# Patient Record
Sex: Male | Born: 1985 | Race: Black or African American | Hispanic: No | Marital: Single
Health system: Southern US, Community
[De-identification: ages and names within clinical notes are randomized; demographics above are authoritative.]

## PROBLEM LIST (undated history)

## (undated) ENCOUNTER — Emergency Department (HOSPITAL_COMMUNITY): Admission: EM | Payer: Medicaid Other

## (undated) DIAGNOSIS — F17200 Nicotine dependence, unspecified, uncomplicated: Secondary | ICD-10-CM

## (undated) DIAGNOSIS — I10 Essential (primary) hypertension: Secondary | ICD-10-CM

## (undated) DIAGNOSIS — S82872C Displaced pilon fracture of left tibia, initial encounter for open fracture type IIIA, IIIB, or IIIC: Secondary | ICD-10-CM

## (undated) DIAGNOSIS — S82872R Displaced pilon fracture of left tibia, subsequent encounter for open fracture type IIIA, IIIB, or IIIC with malunion: Secondary | ICD-10-CM

## (undated) DIAGNOSIS — S82832C Other fracture of upper and lower end of left fibula, initial encounter for open fracture type IIIA, IIIB, or IIIC: Secondary | ICD-10-CM

## (undated) DIAGNOSIS — R569 Unspecified convulsions: Secondary | ICD-10-CM

## (undated) DIAGNOSIS — S9302XA Subluxation of left ankle joint, initial encounter: Secondary | ICD-10-CM

## (undated) DIAGNOSIS — Z789 Other specified health status: Secondary | ICD-10-CM

## (undated) HISTORY — PX: LEG SURGERY: SHX1003

## (undated) HISTORY — PX: NO PAST SURGERIES: SHX2092

## (undated) HISTORY — PX: MANDIBLE FRACTURE SURGERY: SHX706

---
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
## 2006-05-02 ENCOUNTER — Emergency Department (HOSPITAL_COMMUNITY): Admission: EM | Admit: 2006-05-02 | Discharge: 2006-05-02 | Payer: Self-pay | Admitting: Emergency Medicine

## 2007-01-22 ENCOUNTER — Emergency Department (HOSPITAL_COMMUNITY): Admission: EM | Admit: 2007-01-22 | Discharge: 2007-01-23 | Payer: Self-pay | Admitting: Emergency Medicine

## 2007-03-16 ENCOUNTER — Emergency Department (HOSPITAL_COMMUNITY): Admission: EM | Admit: 2007-03-16 | Discharge: 2007-03-16 | Payer: Self-pay | Admitting: Emergency Medicine

## 2007-03-23 ENCOUNTER — Emergency Department (HOSPITAL_COMMUNITY): Admission: EM | Admit: 2007-03-23 | Discharge: 2007-03-23 | Payer: Self-pay | Admitting: Emergency Medicine

## 2007-06-24 ENCOUNTER — Emergency Department (HOSPITAL_COMMUNITY): Admission: EM | Admit: 2007-06-24 | Discharge: 2007-06-24 | Payer: Self-pay | Admitting: Emergency Medicine

## 2007-12-04 ENCOUNTER — Emergency Department (HOSPITAL_COMMUNITY): Admission: EM | Admit: 2007-12-04 | Discharge: 2007-12-04 | Payer: Self-pay | Admitting: Emergency Medicine

## 2007-12-11 ENCOUNTER — Emergency Department (HOSPITAL_COMMUNITY): Admission: EM | Admit: 2007-12-11 | Discharge: 2007-12-11 | Payer: Self-pay | Admitting: Emergency Medicine

## 2010-04-13 ENCOUNTER — Emergency Department (HOSPITAL_COMMUNITY)
Admission: EM | Admit: 2010-04-13 | Discharge: 2010-04-13 | Disposition: A | Discharge status: Home / Self Care | Payer: Self-pay | Attending: Emergency Medicine | Admitting: Emergency Medicine

## 2010-04-13 DIAGNOSIS — S21109A Unspecified open wound of unspecified front wall of thorax without penetration into thoracic cavity, initial encounter: Secondary | ICD-10-CM | POA: Insufficient documentation

## 2010-04-13 DIAGNOSIS — W208XXA Other cause of strike by thrown, projected or falling object, initial encounter: Secondary | ICD-10-CM | POA: Insufficient documentation

## 2010-04-13 DIAGNOSIS — M542 Cervicalgia: Secondary | ICD-10-CM | POA: Insufficient documentation

## 2010-04-13 DIAGNOSIS — Y92009 Unspecified place in unspecified non-institutional (private) residence as the place of occurrence of the external cause: Secondary | ICD-10-CM | POA: Insufficient documentation

## 2010-04-13 DIAGNOSIS — R079 Chest pain, unspecified: Secondary | ICD-10-CM | POA: Insufficient documentation

## 2010-04-13 DIAGNOSIS — S1190XA Unspecified open wound of unspecified part of neck, initial encounter: Secondary | ICD-10-CM | POA: Insufficient documentation

## 2010-04-23 ENCOUNTER — Emergency Department (HOSPITAL_COMMUNITY)
Admission: EM | Admit: 2010-04-23 | Discharge: 2010-04-23 | Disposition: A | Discharge status: Home / Self Care | Payer: Self-pay | Attending: Emergency Medicine | Admitting: Emergency Medicine

## 2010-04-23 DIAGNOSIS — Z4802 Encounter for removal of sutures: Secondary | ICD-10-CM | POA: Insufficient documentation

## 2010-07-20 ENCOUNTER — Emergency Department (HOSPITAL_COMMUNITY): Payer: Self-pay

## 2010-07-20 ENCOUNTER — Emergency Department (HOSPITAL_COMMUNITY)
Admission: EM | Admit: 2010-07-20 | Discharge: 2010-07-20 | Disposition: A | Discharge status: Home / Self Care | Payer: Self-pay | Attending: Emergency Medicine | Admitting: Emergency Medicine

## 2010-07-20 DIAGNOSIS — X58XXXA Exposure to other specified factors, initial encounter: Secondary | ICD-10-CM | POA: Insufficient documentation

## 2010-07-20 DIAGNOSIS — S62329A Displaced fracture of shaft of unspecified metacarpal bone, initial encounter for closed fracture: Secondary | ICD-10-CM | POA: Insufficient documentation

## 2010-07-20 DIAGNOSIS — F1010 Alcohol abuse, uncomplicated: Secondary | ICD-10-CM | POA: Insufficient documentation

## 2010-10-16 ENCOUNTER — Emergency Department (HOSPITAL_COMMUNITY)
Admission: EM | Admit: 2010-10-16 | Discharge: 2010-10-16 | Disposition: A | Discharge status: Home / Self Care | Payer: Self-pay | Attending: Emergency Medicine | Admitting: Emergency Medicine

## 2010-10-16 DIAGNOSIS — F17200 Nicotine dependence, unspecified, uncomplicated: Secondary | ICD-10-CM | POA: Insufficient documentation

## 2010-10-16 DIAGNOSIS — Y92009 Unspecified place in unspecified non-institutional (private) residence as the place of occurrence of the external cause: Secondary | ICD-10-CM | POA: Insufficient documentation

## 2010-10-16 DIAGNOSIS — S61409A Unspecified open wound of unspecified hand, initial encounter: Secondary | ICD-10-CM | POA: Insufficient documentation

## 2010-10-16 DIAGNOSIS — W268XXA Contact with other sharp object(s), not elsewhere classified, initial encounter: Secondary | ICD-10-CM | POA: Insufficient documentation

## 2010-10-16 DIAGNOSIS — S61419A Laceration without foreign body of unspecified hand, initial encounter: Secondary | ICD-10-CM

## 2010-10-16 MED ORDER — LIDOCAINE HCL (PF) 1 % IJ SOLN
INTRAMUSCULAR | Status: AC
  Administered 2010-10-16: 5 mL
  Filled 2010-10-16: qty 5

## 2010-10-16 NOTE — ED Notes (Signed)
Pt states he was washing a glass today and glass broke with his hand inside cutting his right hand.

## 2010-10-16 NOTE — ED Provider Notes (Signed)
History     CSN: [id number] Arrival date & time: 10/16/2010  2:18 PM  Chief Complaint  Patient presents with  . Extremity Laceration   HPI  History reviewed. No pertinent past medical history.  History reviewed. No pertinent past surgical history.  No family history on file.  History  Substance Use Topics  . Smoking status: Current Everyday Smoker  . Smokeless tobacco: Not on file  . Alcohol Use: Yes      Review of Systems  Physical Exam  BP 137/68  Pulse 76  Temp(Src) 98.4 F (36.9 C) (Oral)  Resp 18  Ht 5\' 9"  (1.753 m)  Wt 191 lb 3.2 oz (86.728 kg)  BMI 28.24 kg/m2  SpO2 100%  Physical Exam  ED Course  Procedures  MDM       Geoffery Lyons, MD 10/16/10 1558

## 2010-10-16 NOTE — ED Provider Notes (Signed)
History   Chart scribed for Damon Lyons, MD by Enos Fling; the patient was seen in room APFT22/APFT22; this patient's care was started at 3:15 PM.   CSN: [id number] Arrival date & time: 10/16/2010  2:18 PM  Chief Complaint  Patient presents with  . Extremity Laceration   HPI Damon Alexander is a 25 y.o. male who presents to the Emergency Department complaining of laceration. Pt c/o laceration to the back of his right hand caused by broken glass while washing dishes just pta. Bleeding is controlled. No numbness or tingling. Pt has no other complaints, states he was in his usual state of health prior to injury. Tetanus UTD. 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   History reviewed. No pertinent past medical history.  History reviewed. No pertinent past surgical history.  No family history on file.  History  Substance Use Topics  . Smoking status: Current Everyday Smoker  . Smokeless tobacco: Not on file  . Alcohol Use: Yes   Previous Medications   No medications on file     Allergies as of 10/16/2010  . (No Known Allergies)      Review of Systems  Constitutional: Negative for fever.  HENT: Negative for congestion.   Eyes: Negative for redness.  Gastrointestinal: Negative for vomiting.  Musculoskeletal: Negative for joint swelling and arthralgias.  Skin: Positive for wound. Negative for rash.  Hematological: Does not bruise/bleed easily.    Physical Exam  BP 137/68  Pulse 76  Temp(Src) 98.4 F (36.9 C) (Oral)  Resp 18  Ht 5\' 9"  (1.753 m)  Wt 191 lb 3.2 oz (86.728 kg)  BMI 28.24 kg/m2  SpO2 100%  Physical Exam  Constitutional: He is oriented to person, place, and time. He appears well-developed and well-nourished. No distress.  HENT:  Head: Normocephalic and atraumatic.  Eyes: Conjunctivae are normal.  Neck: Normal range of motion. Neck supple.  Musculoskeletal: Normal range of motion.  Neurological: He is alert and oriented to person,  place, and time.  Skin: Skin is warm and dry. No rash noted.       2.5 cm laceration to back of hand, tendons intact, NVI  Psychiatric: He has a normal mood and affect.    Procedures - Laceration repair, see below  OTHER DATA REVIEWED: Nursing notes and vital signs reviewed.   ED COURSE: LACERATION REPAIR PROCEDURE NOTE The patient's identification was confirmed and consent was obtained. This procedure was performed by Damon Lyons, MD at 3:23 PM. Site: dorsum right hand Sterile procedures observed Anesthetic used (type and amt): 1% lidocaine Suture type/size:4-0 prolene Length: 2.5cm # of Sutures: 4 Technique: simple interrupted Antibx ointment applied Tetanus UTD  Site anesthetized, irrigated copiously with NS and betadine, explored without evidence of foreign body, wound well approximated, site covered with dry, sterile dressing.  Patient tolerated procedure well without complications. Instructions for care discussed verbally and patient provided with additional written instructions for homecare and f/u.   MDM: No tendon involvement.  Closed uneventfully.  Return in one week to have sutures removed.  IMPRESSION: 1. Laceration of hand      PLAN: Discharge All results reviewed and discussed with pt, questions answered, pt agreeable with plan.   CONDITION ON DISCHARGE: Improved  MEDS GIVEN IN ED:  Medications  lidocaine (XYLOCAINE) 1 % injection (5 mL  Given 10/16/10 1515)     SCRIBE ATTESTATION: I personally performed the services described in this documentation, which was scribed in my presence. The recorded information  has been reviewed and considered. No att. providers found      Damon Lyons, MD 10/16/10 1557

## 2010-10-16 NOTE — ED Notes (Signed)
Pt washing dishes and put hand in glass cup. Cup broke and cut rt hand. Laceration noted to back of rt hand. Bleeding controlled at this time.

## 2011-05-30 ENCOUNTER — Emergency Department (HOSPITAL_COMMUNITY)
Admission: EM | Admit: 2011-05-30 | Discharge: 2011-05-30 | Discharge status: Against Medical Advice | Payer: Self-pay | Attending: Emergency Medicine | Admitting: Emergency Medicine

## 2011-05-30 ENCOUNTER — Encounter (HOSPITAL_COMMUNITY): Payer: Self-pay

## 2011-05-30 DIAGNOSIS — R112 Nausea with vomiting, unspecified: Secondary | ICD-10-CM | POA: Insufficient documentation

## 2011-05-30 DIAGNOSIS — R109 Unspecified abdominal pain: Secondary | ICD-10-CM | POA: Insufficient documentation

## 2011-05-30 NOTE — ED Notes (Signed)
Pt c/o left groin/lower abd pain associated mild n/v, pt states that the pain started yesterday, admits to problems with urination.

## 2011-05-30 NOTE — ED Notes (Signed)
Unable to locate pt in all waiting areas 

## 2011-05-30 NOTE — ED Notes (Signed)
Called pt in all waiting areas without response

## 2011-05-31 ENCOUNTER — Emergency Department (HOSPITAL_COMMUNITY)
Admission: EM | Admit: 2011-05-31 | Discharge: 2011-05-31 | Disposition: A | Discharge status: Home / Self Care | Payer: Self-pay | Attending: Emergency Medicine | Admitting: Emergency Medicine

## 2011-05-31 ENCOUNTER — Encounter (HOSPITAL_COMMUNITY): Payer: Self-pay | Admitting: *Deleted

## 2011-05-31 ENCOUNTER — Emergency Department (HOSPITAL_COMMUNITY): Payer: Self-pay

## 2011-05-31 DIAGNOSIS — R1032 Left lower quadrant pain: Secondary | ICD-10-CM | POA: Insufficient documentation

## 2011-05-31 DIAGNOSIS — N451 Epididymitis: Secondary | ICD-10-CM

## 2011-05-31 DIAGNOSIS — N453 Epididymo-orchitis: Secondary | ICD-10-CM | POA: Insufficient documentation

## 2011-05-31 LAB — URINE MICROSCOPIC-ADD ON

## 2011-05-31 LAB — URINALYSIS, ROUTINE W REFLEX MICROSCOPIC
Ketones, ur: NEGATIVE mg/dL
Nitrite: NEGATIVE
Protein, ur: 30 mg/dL — AB
pH: 6.5 (ref 5.0–8.0)

## 2011-05-31 MED ORDER — HYDROCODONE-ACETAMINOPHEN 5-325 MG PO TABS
1.0000 | ORAL_TABLET | Freq: Once | ORAL | Status: AC
  Administered 2011-05-31: 1 via ORAL
  Filled 2011-05-31: qty 1

## 2011-05-31 MED ORDER — DOXYCYCLINE HYCLATE 100 MG PO CAPS: 100.0000 mg | ORAL_CAPSULE | Freq: Two times a day (BID) | ORAL | Status: AC

## 2011-05-31 MED ORDER — LIDOCAINE HCL (PF) 1 % IJ SOLN
INTRAMUSCULAR | Status: AC
  Administered 2011-05-31: 5 mL
  Filled 2011-05-31: qty 5

## 2011-05-31 MED ORDER — CEFTRIAXONE SODIUM 250 MG IJ SOLR
250.0000 mg | Freq: Once | INTRAMUSCULAR | Status: AC
  Administered 2011-05-31: 250 mg via INTRAMUSCULAR
  Filled 2011-05-31: qty 250

## 2011-05-31 MED ORDER — HYDROCODONE-ACETAMINOPHEN 5-325 MG PO TABS: 1.0000 | ORAL_TABLET | Freq: Four times a day (QID) | ORAL | Status: AC | PRN

## 2011-05-31 MED ORDER — NAPROXEN 500 MG PO TABS: 500.0000 mg | ORAL_TABLET | Freq: Two times a day (BID) | ORAL | Status: DC

## 2011-05-31 NOTE — ED Notes (Signed)
C/o pain to left lower side x 3 days.  Reports left testicle started swelling last night.  Denies GU sx, although pt reports had a drop of blood with urination.  Denies n/v/d.

## 2011-05-31 NOTE — ED Provider Notes (Addendum)
History     CSN: [id number]  Arrival date & time 05/31/11  4098   First MD Initiated Contact with Patient 05/31/11 [phone]      Chief Complaint  Patient presents with  . left side pain   . Groin Swelling    (Consider location/radiation/quality/duration/timing/severity/associated sxs/prior treatment) The history is provided by the patient.   patient is a 26 year old male with complaint of left lower quadrant abdominal pain for 3 days. Onset of left testicular swelling and pain last night. Has noted a few drops of blood in his urine. No history of similar problem. No fever no chills patient states the pain is 10 out of 10 goes from the left testicle to the left lower quadrant area denies any groin mass. Denies any penile discharge.  History reviewed. No pertinent past medical history.  History reviewed. No pertinent past surgical history.  No family history on file.  History  Substance Use Topics  . Smoking status: Current Everyday Smoker    Types: Cigarettes  . Smokeless tobacco: Not on file  . Alcohol Use: Yes     every other day      Review of Systems  Constitutional: Negative for fever and chills.  HENT: Negative for neck pain.   Eyes: Negative for redness.  Respiratory: Negative for shortness of breath.   Cardiovascular: Negative for chest pain.  Gastrointestinal: Positive for abdominal pain. Negative for nausea and vomiting.  Genitourinary: Positive for hematuria, scrotal swelling and testicular pain.  Musculoskeletal: Negative for back pain.  Skin: Negative for rash.  Neurological: Negative for headaches.  Hematological: Does not bruise/bleed easily.    Allergies  Review of patient's allergies indicates no known allergies.  Home Medications   Current Outpatient Rx  Name Route Sig Dispense Refill  . DOXYCYCLINE HYCLATE 100 MG PO CAPS Oral Take 1 capsule (100 mg total) by mouth 2 (two) times daily. 20 capsule 0  . HYDROCODONE-ACETAMINOPHEN 5-325 MG PO TABS  Oral Take 1-2 tablets by mouth every 6 (six) hours as needed for pain. 10 tablet 0  . NAPROXEN 500 MG PO TABS Oral Take 1 tablet (500 mg total) by mouth 2 (two) times daily. 14 tablet 0    BP 137/79  Pulse 74  Temp(Src) 98.8 F (37.1 C) (Oral)  Resp 20  Ht 5\' 9"  (1.753 m)  Wt 188 lb (85.276 kg)  BMI 27.76 kg/m2  SpO2 97%  Physical Exam  Nursing note and vitals reviewed. Constitutional: He is oriented to person, place, and time. He appears well-developed and well-nourished. No distress.  HENT:  Head: Normocephalic and atraumatic.  Mouth/Throat: Oropharynx is clear and moist.  Eyes: Conjunctivae and EOM are normal. Pupils are equal, round, and reactive to light.  Neck: Normal range of motion. Neck supple.  Cardiovascular: Normal rate, regular rhythm and normal heart sounds.   No murmur heard. Pulmonary/Chest: Effort normal and breath sounds normal. No respiratory distress. He has no wheezes. He has no rales.  Abdominal: Soft. Bowel sounds are normal. He exhibits no mass.       Minimal left lower quadrant tenderness no guarding.  Genitourinary: Penis normal. No penile tenderness.       No penile discharge. Marked swelling of the left testicle with tenderness not able to appreciate a boggy epididymis. Right testicle normal. No inguinal hernias.  Musculoskeletal: Normal range of motion. He exhibits no edema.  Neurological: He is alert and oriented to person, place, and time. No cranial nerve deficit. He exhibits normal muscle tone.  Coordination normal.  Skin: Skin is warm. No rash noted.    ED Course  Procedures (including critical care time)  Labs Reviewed  URINALYSIS, ROUTINE W REFLEX MICROSCOPIC - Abnormal; Notable for the following:    Color, Urine AMBER (*) BIOCHEMICALS MAY BE AFFECTED BY COLOR   APPearance CLOUDY (*)    Hgb urine dipstick MODERATE (*)    Bilirubin Urine SMALL (*)    Protein, ur 30 (*)    Urobilinogen, UA 4.0 (*)    Leukocytes, UA SMALL (*)    All other  components within normal limits  URINE MICROSCOPIC-ADD ON - Abnormal; Notable for the following:    Bacteria, UA MANY (*)    All other components within normal limits   US Scrotum  05/31/2011  *RADIOLOGY REPORT*  Clinical Data:  Left scrotal pain and swelling.  SCROTAL ULTRASOUND DOPPLER ULTRASOUND OF THE TESTICLES  Technique: Complete ultrasound examination of the testicles, epididymis, and other scrotal structures was performed.  Color and spectral Doppler ultrasound were also utilized to evaluate blood flow to the testicles.  Comparison:  None.  Findings:  Right testis:  Mildly heterogeneous.  Scattered tiny calcifications.  Left testis:  Mildly heterogeneous.  Scattered tiny calcifications.  Right epididymis:  Normal in size and appearance.  Left epididymis:  Enlarged and heterogeneous with a increased internal blood flow with color Doppler.  Hydrocele:  Small hydrocele on the left.  Varicocele:  None.  Pulsed Doppler interrogation of both testes demonstrates low resistance flow bilaterally.  IMPRESSION:  1.  Left epididymitis. 2.  Bilateral testicular microlithiasis. 3.  Small left hydrocele.  Original Report Authenticated By: Darrol Angel, M.D.   [place] Art/ven Flow Abd Pelv Doppler  05/31/2011  *RADIOLOGY REPORT*  Clinical Data:  Left scrotal pain and swelling.  SCROTAL ULTRASOUND DOPPLER ULTRASOUND OF THE TESTICLES  Technique: Complete ultrasound examination of the testicles, epididymis, and other scrotal structures was performed.  Color and spectral Doppler ultrasound were also utilized to evaluate blood flow to the testicles.  Comparison:  None.  Findings:  Right testis:  Mildly heterogeneous.  Scattered tiny calcifications.  Left testis:  Mildly heterogeneous.  Scattered tiny calcifications.  Right epididymis:  Normal in size and appearance.  Left epididymis:  Enlarged and heterogeneous with a increased internal blood flow with color Doppler.  Hydrocele:  Small hydrocele on the left.  Varicocele:   None.  Pulsed Doppler interrogation of both testes demonstrates low resistance flow bilaterally.  IMPRESSION:  1.  Left epididymitis. 2.  Bilateral testicular microlithiasis. 3.  Small left hydrocele.  Original Report Authenticated By: Darrol Angel, M.D.   Results for orders placed during the hospital encounter of 05/31/11  URINALYSIS, ROUTINE W REFLEX MICROSCOPIC      Component Value Range   Color, Urine AMBER (*) YELLOW    APPearance CLOUDY (*) CLEAR    Specific Gravity, Urine 1.020  1.005 - 1.030    pH 6.5  5.0 - 8.0    Glucose, UA NEGATIVE  NEGATIVE (mg/dL)   Hgb urine dipstick MODERATE (*) NEGATIVE    Bilirubin Urine SMALL (*) NEGATIVE    Ketones, ur NEGATIVE  NEGATIVE (mg/dL)   Protein, ur 30 (*) NEGATIVE (mg/dL)   Urobilinogen, UA 4.0 (*) 0.0 - 1.0 (mg/dL)   Nitrite NEGATIVE  NEGATIVE    Leukocytes, UA SMALL (*) NEGATIVE   URINE MICROSCOPIC-ADD ON      Component Value Range   WBC, UA 11-20  <3 (WBC/hpf)   RBC / HPF 3-6  <  3 (RBC/hpf)   Bacteria, UA MANY (*) RARE    Urine-Other MUCOUS PRESENT       1. Epididymitis       MDM   Clinically suspect the epididymitis with orchitis but will get ultrasound Doppler rule out testicular torsion since the testicular pain just started last night. Patient has had some left lower quadrant discomfort since Thursday would be 3 days ago. Noted some blood, urinalysis consistent with the epididymitis as well.   Ultrasound consistent with left-sided epididymitis. Based on age treat with IM Rocephin here and ten-day course of doxycycline.     Shelda Jakes, MD 05/31/11 1610  Shelda Jakes, MD 05/31/11 1024

## 2011-05-31 NOTE — Discharge Instructions (Signed)
Epididymitis Epididymitis is a swelling (inflammation) of the epididymis. The epididymis is a cord-like structure along the back part of the testicle. Epididymitis is usually, but not always, caused by infection. This is usually a sudden problem beginning with chills, fever and pain behind the scrotum and in the testicle. There may be swelling and redness of the testicle. DIAGNOSIS  Physical examination will reveal a tender, swollen epididymis. Sometimes, cultures are obtained from the urine or from prostate secretions to help find out if there is an infection or if the cause is a different problem. Sometimes, blood work is performed to see if your white blood cell count is elevated and if a germ (bacterial) or viral infection is present. Using this knowledge, an appropriate medicine which kills germs (antibiotic) can be chosen by your caregiver. A viral infection causing epididymitis will most often go away (resolve) without treatment. HOME CARE INSTRUCTIONS   Hot sitz baths for 20 minutes, 4 times per day, may help relieve pain.   Only take over-the-counter or prescription medicines for pain, discomfort or fever as directed by your caregiver.   Take all medicines, including antibiotics, as directed. Take the antibiotics for the full prescribed length of time even if you are feeling better.   It is very important to keep all follow-up appointments.  SEEK IMMEDIATE MEDICAL CARE IF:   You have a fever.   You have pain not relieved with medicines.   You have any worsening of your problems.   Your pain seems to come and go.   You develop pain, redness, and swelling in the scrotum and surrounding areas.  MAKE SURE YOU:   Understand these instructions.   Will watch your condition.   Will get help right away if you are not doing well or get worse.  Document Released: 01/18/2000 Document Revised: 01/09/2011 Document Reviewed: 12/07/2008 [facility] Patient Information 2012 [place],  [place].  RESOURCE GUIDE  Dental Problems  Patients with Medicaid: [facility] [phone] [address]                                           [phone] [address] Phone:  [phone]                                                  Phone:  [phone]  If unable to pay or uninsured, contact:  [facility] or [facility]. to become qualified for the adult dental clinic.  Chronic Pain Problems Contact [facility]  [phone] Patients need to be referred by their primary care doctor.  Insufficient Money for Medicine Contact [employer]:  call "211" or [facility] 469-728-9158.  No Primary Care Doctor Call [facility]  [phone] Other agencies that provide inexpensive medical care    [facility]  [phone]    [facility]  [phone]    [facility]  [phone]    [facility]  [phone]    [facility]  [phone]    [facility]  [phone]  Psychological Services [facility]  [phone]  [employer] [facility]  [phone] [facility]   [phone] (emergency services [phone])  Substance Abuse Resources [facility]  2766552884 [facility] [phone] [facility] [place] [phone] Floydene Flock [phone] Residential & Outpatient Substance Abuse Program  919-802-7812  Abuse/Neglect [facility] Child Abuse Hotline (629) 514-4822 [facility] Child Abuse Hotline 563-153-0855 (After Hours)  Emergency Shelter [facility] 417-033-4719  M[facility] [facility] 712-078-2574 [facility] [phone]  MRSA Hotline #:   212-146-6240    [facility] Resources  [facility]     [employer]                          [facility] [address] [place]                       [address]      [address]  Blondell Reveal Phone:  [phone]                                   Phone:  [phone]                 Phone:  [phone]  [facility] Mental Health Phone:  [phone]  [facility] Child Abuse Hotline (779) 454-0736 [phone] (After Hours)  Assistant with epididymitis. Take in a body cast directed until finished. Scrotal support is helpful. Take Naprosyn or Motrin on a regular basis. Supplement with hydrocodone pain medicine as needed. Return for new or worse symptoms. Return if not improving in 2-4 days. Followup with primary care Dr. resource guide provided help you find one.

## 2011-08-18 ENCOUNTER — Emergency Department (HOSPITAL_COMMUNITY): Payer: Self-pay

## 2011-08-18 ENCOUNTER — Emergency Department (HOSPITAL_COMMUNITY)
Admission: EM | Admit: 2011-08-18 | Discharge: 2011-08-18 | Disposition: A | Discharge status: Home / Self Care | Payer: Self-pay | Attending: Emergency Medicine | Admitting: Emergency Medicine

## 2011-08-18 ENCOUNTER — Encounter (HOSPITAL_COMMUNITY): Payer: Self-pay | Admitting: Emergency Medicine

## 2011-08-18 DIAGNOSIS — N509 Disorder of male genital organs, unspecified: Secondary | ICD-10-CM | POA: Insufficient documentation

## 2011-08-18 DIAGNOSIS — N451 Epididymitis: Secondary | ICD-10-CM

## 2011-08-18 DIAGNOSIS — N5089 Other specified disorders of the male genital organs: Secondary | ICD-10-CM | POA: Insufficient documentation

## 2011-08-18 DIAGNOSIS — N453 Epididymo-orchitis: Secondary | ICD-10-CM | POA: Insufficient documentation

## 2011-08-18 DIAGNOSIS — R109 Unspecified abdominal pain: Secondary | ICD-10-CM | POA: Insufficient documentation

## 2011-08-18 DIAGNOSIS — R1909 Other intra-abdominal and pelvic swelling, mass and lump: Secondary | ICD-10-CM | POA: Insufficient documentation

## 2011-08-18 DIAGNOSIS — F17200 Nicotine dependence, unspecified, uncomplicated: Secondary | ICD-10-CM | POA: Insufficient documentation

## 2011-08-18 DIAGNOSIS — R319 Hematuria, unspecified: Secondary | ICD-10-CM | POA: Insufficient documentation

## 2011-08-18 LAB — URINALYSIS, ROUTINE W REFLEX MICROSCOPIC
Bilirubin Urine: NEGATIVE
Specific Gravity, Urine: 1.025 (ref 1.005–1.030)
pH: 6 (ref 5.0–8.0)

## 2011-08-18 LAB — URINE MICROSCOPIC-ADD ON

## 2011-08-18 MED ORDER — CEFTRIAXONE SODIUM 250 MG IJ SOLR
250.0000 mg | Freq: Once | INTRAMUSCULAR | Status: AC
  Administered 2011-08-18: 250 mg via INTRAMUSCULAR
  Filled 2011-08-18: qty 250

## 2011-08-18 MED ORDER — LIDOCAINE HCL (PF) 1 % IJ SOLN
INTRAMUSCULAR | Status: AC
  Administered 2011-08-18: 2.1 mL
  Filled 2011-08-18: qty 5

## 2011-08-18 MED ORDER — DOXYCYCLINE HYCLATE 100 MG PO CAPS: 100.0000 mg | ORAL_CAPSULE | Freq: Two times a day (BID) | ORAL | Status: AC

## 2011-08-18 MED ORDER — METRONIDAZOLE 500 MG PO TABS: 500.0000 mg | ORAL_TABLET | Freq: Two times a day (BID) | ORAL | Status: AC

## 2011-08-18 MED ORDER — DOXYCYCLINE HYCLATE 100 MG PO TABS
100.0000 mg | ORAL_TABLET | Freq: Once | ORAL | Status: AC
  Administered 2011-08-18: 100 mg via ORAL
  Filled 2011-08-18: qty 1

## 2011-08-18 NOTE — ED Notes (Signed)
Pt to xray

## 2011-08-18 NOTE — ED Notes (Signed)
Pt c/o pain and swelling to his right testicle. Pt alert and oriented x 3. Skin warm and dry. Color pink. Breath sounds clear and equal bilaterally. Pt notified that he was to have nothing to eat or drink per orders.

## 2011-08-18 NOTE — ED Notes (Signed)
Pt c/o rt testicle swelling and abd pain since Saturday.

## 2011-08-18 NOTE — ED Provider Notes (Signed)
History  This chart was scribed for Glynn Octave, MD by Ladona Ridgel Day. This patient was seen in room APA04/APA04 and the patient's care was started at 0708.  CSN: [id number]  Arrival date & time 08/18/11  0708   First MD Initiated Contact with Patient 08/18/11 0719      Chief Complaint  Patient presents with  . Groin Swelling  . Abdominal Pain    Patient is a 26 y.o. male presenting with abdominal pain. The history is provided by the patient. No language interpreter was used.  Abdominal Pain The primary symptoms of the illness include abdominal pain (Right sided. ). The primary symptoms of the illness do not include fever, shortness of breath, vomiting or diarrhea.  Additional symptoms associated with the illness include hematuria (Mild. ). Symptoms associated with the illness do not include chills or back pain.   Damon Alexander is a 26 y.o. male who presents to the Emergency Department complaining of gradually worsening right testicular swelling/pain which began two days ago and states a similar previous episode on his left side. He states previously diagnosed with an STI and treated with antibiotics and pain medicine. He also states with increased testicular pain he is having right sided abdominal pain for two days and states bloody discharge from penis, mild hematuria, sexually active with condom use. He denies any emesis, abnormal BMs, and no prev abdominal surgeries.    History reviewed. No pertinent past medical history.  History reviewed. No pertinent past surgical history.  History reviewed. No pertinent family history.  History  Substance Use Topics  . Smoking status: Current Everyday Smoker    Types: Cigarettes  . Smokeless tobacco: Not on file  . Alcohol Use: Yes     every other day      Review of Systems  Constitutional: Negative for fever and chills.  HENT: Negative for congestion and rhinorrhea.   Eyes: Negative for redness.  Respiratory: Negative for cough,  chest tightness and shortness of breath.   Cardiovascular: Negative for chest pain.  Gastrointestinal: Positive for abdominal pain (Right sided. ). Negative for vomiting and diarrhea.  Genitourinary: Positive for hematuria (Mild. ) and testicular pain (Right testicular pain/swelling. ). Negative for flank pain.  Musculoskeletal: Negative for back pain.  Skin: Negative for color change and pallor.  Neurological: Negative for headaches.  All other systems reviewed and are negative.    Allergies  Review of patient's allergies indicates no known allergies.  Home Medications   Current Outpatient Rx  Name Route Sig Dispense Refill  . NAPROXEN 500 MG PO TABS Oral Take 1 tablet (500 mg total) by mouth 2 (two) times daily. 14 tablet 0    Triage Vitals: BP 151/77  Pulse 94  Temp 98.7 F (37.1 C) (Oral)  Resp 18  Ht 5' 9.5" (1.765 m)  Wt 188 lb (85.276 kg)  BMI 27.36 kg/m2  SpO2 97%  Physical Exam  Nursing note and vitals reviewed. Constitutional: He is oriented to person, place, and time. He appears well-developed and well-nourished. No distress.  HENT:  Head: Normocephalic and atraumatic.  Eyes: EOM are normal.  Neck: Neck supple. No tracheal deviation present.  Cardiovascular: Normal rate.   Pulmonary/Chest: Effort normal. No respiratory distress.  Abdominal: Soft. He exhibits no distension. There is no rebound and no guarding.       Non tender at McBurney's point.   Genitourinary: Penis normal.       Asymetrically swollen right testiclec diffusely tender. No discharge. Right inguinal  pain.    Musculoskeletal: Normal range of motion.  Neurological: He is alert and oriented to person, place, and time.  Skin: Skin is warm and dry.  Psychiatric: He has a normal mood and affect. His behavior is normal.    ED Course  Procedures (including critical care time) DIAGNOSTIC STUDIES: Oxygen Saturation is 97% on room air, adequate by my interpretation.    COORDINATION OF CARE: At  727 AM Discussed treatment plan with patient which includes ultrasound for right testicle, and urine analysis. Patient agrees.   Labs Reviewed  URINALYSIS, ROUTINE W REFLEX MICROSCOPIC - Abnormal; Notable for the following:    Glucose, UA 100 (*)     Hgb urine dipstick MODERATE (*)     Leukocytes, UA SMALL (*)     All other components within normal limits  URINE MICROSCOPIC-ADD ON - Abnormal; Notable for the following:    Bacteria, UA FEW (*)     All other components within normal limits  GC/CHLAMYDIA PROBE AMP, GENITAL   US Scrotum  08/18/2011  *RADIOLOGY REPORT*  Clinical Data:  Right testicular swelling question torsion  SCROTAL ULTRASOUND DOPPLER ULTRASOUND OF THE TESTICLES  Technique: Complete ultrasound examination of the testicles, epididymis, and other scrotal structures was performed.  Color and spectral Doppler ultrasound were also utilized to evaluate blood flow to the testicles.  Comparison:  05/31/2011  Findings:  Right testis:  5.0 x 3.0 x 3.1 cm. Scattered hypoechoic striations within the right testis similar to previous exam.  Few tiny nonspecific intratesticular calcifications.  No intratesticular mass or cyst. Hyperemia within right testis on color Doppler imaging.  Left testis:  4.1 x 2.6 x 3.1 cm. Normal parenchymal echogenicity. Few tiny scattered intratesticular microcalcifications, nonspecific.  No intratesticular mass or cyst.  Internal blood flow present within left testis on color Doppler imaging.  Right epididymis:  Enlarged, heterogeneous, with significantly increased blood flow on color Doppler imaging.  No focal mass.  Left epididymis:  Normal in size and appearance.  Hydrocele:  Trace bilateral peri testicular fluid.  Varicocele:  Absent bilaterally  Pulsed Doppler interrogation of both testes demonstrates intratesticular venous and low resistance arterial blood flow.  No hernia.  IMPRESSION: No evidence of testicular mass or torsion. Significant hyperemia of right  epididymis and to a lesser extent right testis as compared to left compatible with epididymo- orchitis. Few tiny bilateral nonspecific microcalcifications.  Original Report Authenticated By: Lollie Marrow, M.D.   [place] Art/ven Flow Abd Pelv Doppler  08/18/2011  *RADIOLOGY REPORT*  Clinical Data:  Right testicular swelling question torsion  SCROTAL ULTRASOUND DOPPLER ULTRASOUND OF THE TESTICLES  Technique: Complete ultrasound examination of the testicles, epididymis, and other scrotal structures was performed.  Color and spectral Doppler ultrasound were also utilized to evaluate blood flow to the testicles.  Comparison:  05/31/2011  Findings:  Right testis:  5.0 x 3.0 x 3.1 cm. Scattered hypoechoic striations within the right testis similar to previous exam.  Few tiny nonspecific intratesticular calcifications.  No intratesticular mass or cyst. Hyperemia within right testis on color Doppler imaging.  Left testis:  4.1 x 2.6 x 3.1 cm. Normal parenchymal echogenicity. Few tiny scattered intratesticular microcalcifications, nonspecific.  No intratesticular mass or cyst.  Internal blood flow present within left testis on color Doppler imaging.  Right epididymis:  Enlarged, heterogeneous, with significantly increased blood flow on color Doppler imaging.  No focal mass.  Left epididymis:  Normal in size and appearance.  Hydrocele:  Trace bilateral peri testicular fluid.  Varicocele:  Absent bilaterally  Pulsed Doppler interrogation of both testes demonstrates intratesticular venous and low resistance arterial blood flow.  No hernia.  IMPRESSION: No evidence of testicular mass or torsion. Significant hyperemia of right epididymis and to a lesser extent right testis as compared to left compatible with epididymo- orchitis. Few tiny bilateral nonspecific microcalcifications.  Original Report Authenticated By: Lollie Marrow, M.D.     No diagnosis found.    MDM  Right testicle and groin pain since last night.  Previous  episode of L sided epididymitis. No fever or vomiting. Some penile discharge.  Markedly swollen R testicle on exam. Abdomen soft, no pain at McBurney's point.  No evidence of torsion on [place].  Treat for epididymitis with rocephin and doxycycline.  Patient instructed on safe sexual practices.  I personally performed the services described in this documentation, which was scribed in my presence.  The recorded information has been reviewed and considered.       Glynn Octave, MD 08/18/11 [phone]

## 2011-08-18 NOTE — ED Notes (Signed)
Back from x-ray. Dr. Manus Gunning in to do specimen collection.

## 2011-08-19 LAB — GC/CHLAMYDIA PROBE AMP, GENITAL: Chlamydia, DNA Probe: NEGATIVE

## 2012-01-13 ENCOUNTER — Emergency Department (HOSPITAL_COMMUNITY)
Admission: EM | Admit: 2012-01-13 | Discharge: 2012-01-13 | Discharge status: Against Medical Advice | Payer: Self-pay | Attending: Emergency Medicine | Admitting: Emergency Medicine

## 2012-01-13 ENCOUNTER — Encounter (HOSPITAL_COMMUNITY): Payer: Self-pay

## 2012-01-13 DIAGNOSIS — Y929 Unspecified place or not applicable: Secondary | ICD-10-CM | POA: Insufficient documentation

## 2012-01-13 DIAGNOSIS — S0990XA Unspecified injury of head, initial encounter: Secondary | ICD-10-CM | POA: Insufficient documentation

## 2012-01-13 DIAGNOSIS — IMO0002 Reserved for concepts with insufficient information to code with codable children: Secondary | ICD-10-CM | POA: Insufficient documentation

## 2012-01-13 DIAGNOSIS — Y939 Activity, unspecified: Secondary | ICD-10-CM | POA: Insufficient documentation

## 2012-01-13 DIAGNOSIS — S0100XA Unspecified open wound of scalp, initial encounter: Secondary | ICD-10-CM | POA: Insufficient documentation

## 2012-01-13 DIAGNOSIS — F17200 Nicotine dependence, unspecified, uncomplicated: Secondary | ICD-10-CM | POA: Insufficient documentation
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
## 2012-01-13 NOTE — ED Notes (Signed)
Pt not in room, girlfriend also gone. No staff aware of his departure.

## 2012-02-04 ENCOUNTER — Encounter (HOSPITAL_COMMUNITY): Payer: Self-pay | Admitting: *Deleted

## 2012-02-04 ENCOUNTER — Emergency Department (HOSPITAL_COMMUNITY)
Admission: EM | Admit: 2012-02-04 | Discharge: 2012-02-04 | Disposition: A | Discharge status: Home / Self Care | Payer: Self-pay | Attending: Emergency Medicine | Admitting: Emergency Medicine

## 2012-02-04 DIAGNOSIS — F17200 Nicotine dependence, unspecified, uncomplicated: Secondary | ICD-10-CM | POA: Insufficient documentation

## 2012-02-04 DIAGNOSIS — S0100XA Unspecified open wound of scalp, initial encounter: Secondary | ICD-10-CM | POA: Insufficient documentation

## 2012-02-04 DIAGNOSIS — S0101XA Laceration without foreign body of scalp, initial encounter: Secondary | ICD-10-CM

## 2012-02-04 DIAGNOSIS — W2209XA Striking against other stationary object, initial encounter: Secondary | ICD-10-CM | POA: Insufficient documentation

## 2012-02-04 DIAGNOSIS — Y9389 Activity, other specified: Secondary | ICD-10-CM | POA: Insufficient documentation

## 2012-02-04 DIAGNOSIS — Y929 Unspecified place or not applicable: Secondary | ICD-10-CM | POA: Insufficient documentation

## 2012-02-04 DIAGNOSIS — F10929 Alcohol use, unspecified with intoxication, unspecified: Secondary | ICD-10-CM

## 2012-02-04 DIAGNOSIS — F1010 Alcohol abuse, uncomplicated: Secondary | ICD-10-CM | POA: Insufficient documentation
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
## 2012-02-13 ENCOUNTER — Encounter (HOSPITAL_COMMUNITY): Payer: Self-pay | Admitting: Emergency Medicine

## 2012-02-13 ENCOUNTER — Emergency Department (HOSPITAL_COMMUNITY)
Admission: EM | Admit: 2012-02-13 | Discharge: 2012-02-13 | Disposition: A | Discharge status: Home / Self Care | Payer: Self-pay | Attending: Emergency Medicine | Admitting: Emergency Medicine

## 2012-02-13 DIAGNOSIS — Z4802 Encounter for removal of sutures: Secondary | ICD-10-CM | POA: Insufficient documentation

## 2012-02-13 DIAGNOSIS — F17200 Nicotine dependence, unspecified, uncomplicated: Secondary | ICD-10-CM | POA: Insufficient documentation

## 2012-02-13 NOTE — ED Notes (Signed)
Pt is here for removal of staples to head which he had placed 10 days ago.

## 2012-02-14 NOTE — ED Provider Notes (Signed)
History     CSN: [id number]  Arrival date & time 02/13/12  1152   First MD Initiated Contact with Patient 02/13/12 1219      Chief Complaint  Patient presents with  . Suture / Staple Removal    (Consider location/radiation/quality/duration/timing/severity/associated sxs/prior treatment) Patient is a 27 y.o. male presenting with suture removal. The history is provided by the patient.  Suture / Staple Removal  The sutures were placed 7 to 10 days ago. There has been no treatment since the wound repair. There has been no drainage from the wound. There is no redness present. There is no swelling present.    History reviewed. No pertinent past medical history.  History reviewed. No pertinent past surgical history.  History reviewed. No pertinent family history.  History  Substance Use Topics  . Smoking status: Current Every Day Smoker    Types: Cigarettes  . Smokeless tobacco: Not on file  . Alcohol Use: Yes     Comment: every other day      Review of Systems  Constitutional: Negative for fever and chills.  Eyes: Negative for visual disturbance.  Skin: Positive for wound.  Neurological: Negative for weakness, numbness and headaches.    Allergies  Review of patient's allergies indicates no known allergies.  Home Medications  No current outpatient prescriptions on file.  BP 133/78  Pulse 71  Temp 97.9 F (36.6 C) (Oral)  Resp 18  SpO2 100%  Physical Exam  Constitutional: He is oriented to person, place, and time. He appears well-developed and well-nourished.  HENT:  Head: Normocephalic.  Cardiovascular: Normal rate.   Pulmonary/Chest: Effort normal.  Neurological: He is alert and oriented to person, place, and time. No sensory deficit.  Skin: Laceration noted.       Well healed scalp laceration.    ED Course  SUTURE REMOVAL Performed by: Burgess Amor Authorized by: Burgess Amor Consent: Verbal consent obtained. Risks and benefits: risks, benefits and  alternatives were discussed Consent given by: patient Patient identity confirmed: verbally with patient Time out: Immediately prior to procedure a "time out" was called to verify the correct patient, procedure, equipment, support staff and site/side marked as required. Body area: head/neck Location details: scalp Wound Appearance: clean Staples Removed: 4 Facility: sutures placed in this facility Patient tolerance: Patient tolerated the procedure well with no immediate complications.   (including critical care time)  Labs Reviewed - No data to display No results found.   1. Encounter for staple removal       MDM  Prn f/u anticipated.        Burgess Amor, [place] 02/14/12 [phone]

## 2012-02-14 NOTE — ED Provider Notes (Signed)
Medical screening examination/treatment/procedure(s) were performed by non-physician practitioner and as supervising physician I was immediately available for consultation/collaboration.   Mariangela Heldt M Tajuana Kniskern, DO 02/14/12 2036 

## 2012-07-24 ENCOUNTER — Emergency Department (HOSPITAL_COMMUNITY)
Admission: EM | Admit: 2012-07-24 | Discharge: 2012-07-24 | Disposition: A | Discharge status: Home / Self Care | Payer: Self-pay | Attending: Emergency Medicine | Admitting: Emergency Medicine

## 2012-07-24 ENCOUNTER — Emergency Department (HOSPITAL_COMMUNITY): Payer: Self-pay

## 2012-07-24 ENCOUNTER — Encounter (HOSPITAL_COMMUNITY): Payer: Self-pay | Admitting: Physical Medicine and Rehabilitation

## 2012-07-24 DIAGNOSIS — W108XXA Fall (on) (from) other stairs and steps, initial encounter: Secondary | ICD-10-CM | POA: Insufficient documentation

## 2012-07-24 DIAGNOSIS — W1809XA Striking against other object with subsequent fall, initial encounter: Secondary | ICD-10-CM | POA: Insufficient documentation

## 2012-07-24 DIAGNOSIS — Y9289 Other specified places as the place of occurrence of the external cause: Secondary | ICD-10-CM | POA: Insufficient documentation

## 2012-07-24 DIAGNOSIS — F17200 Nicotine dependence, unspecified, uncomplicated: Secondary | ICD-10-CM | POA: Insufficient documentation

## 2012-07-24 DIAGNOSIS — S82899A Other fracture of unspecified lower leg, initial encounter for closed fracture: Secondary | ICD-10-CM | POA: Insufficient documentation

## 2012-07-24 DIAGNOSIS — S82409A Unspecified fracture of shaft of unspecified fibula, initial encounter for closed fracture: Secondary | ICD-10-CM | POA: Insufficient documentation

## 2012-07-24 DIAGNOSIS — S82301A Unspecified fracture of lower end of right tibia, initial encounter for closed fracture: Secondary | ICD-10-CM

## 2012-07-24 DIAGNOSIS — Y9389 Activity, other specified: Secondary | ICD-10-CM | POA: Insufficient documentation

## 2012-07-24 DIAGNOSIS — S82861A Displaced Maisonneuve's fracture of right leg, initial encounter for closed fracture: Secondary | ICD-10-CM

## 2012-07-24 MED ORDER — OXYCODONE-ACETAMINOPHEN 5-325 MG PO TABS
2.0000 | ORAL_TABLET | Freq: Once | ORAL | Status: AC
  Administered 2012-07-24: 2 via ORAL
  Filled 2012-07-24: qty 2

## 2012-07-24 MED ORDER — OXYCODONE-ACETAMINOPHEN 5-325 MG PO TABS: 1.0000 | ORAL_TABLET | ORAL | Status: DC | PRN

## 2012-07-24 MED ORDER — KETOROLAC TROMETHAMINE 60 MG/2ML IM SOLN
60.0000 mg | Freq: Once | INTRAMUSCULAR | Status: AC
  Administered 2012-07-24: 60 mg via INTRAMUSCULAR
  Filled 2012-07-24: qty 2

## 2012-07-24 NOTE — ED Provider Notes (Signed)
Medical screening examination/treatment/procedure(s) were performed by non-physician practitioner and as supervising physician I was immediately available for consultation/collaboration.  Ethelda Chick, MD 07/24/12 [phone]

## 2012-07-24 NOTE — ED Notes (Signed)
Pt states pain to RLE after fall last night. Now states increased pain and inability to bear weight on R leg. Pedal pulses present.

## 2012-07-24 NOTE — ED Provider Notes (Signed)
History  This chart was scribed for non-physician practitioner working with No att. providers found by Greggory Stallion, ED scribe. This patient was seen in room TR08C/TR08C and the patient's care was started at 3:00 PM.  CSN: [id number]  Arrival date & time 07/24/12  1451   None     Chief Complaint  Patient presents with  . Leg Pain    The history is provided by the patient. No language interpreter was used.    HPI Comments:   Damon Alexander is a 27 y.o. male presenting to the Emergency Department complaining of right lower extremity pain that started early this morning. Pt states he lost his footing and fell while trying to go up some stairs, hitting his shin directly on the stairs. Pt states initially his leg felt fine but pain has worsened throughout the day today. Pain worse with movement and ambulation, better with lying still.  No prior injury of right ankle or leg.  Pt now unable to bear weight at this time. No numbness or paresthesias of RLE.  Has not taken any pain meds PTA.  No past medical history on file.  No past surgical history on file.  History reviewed. No pertinent family history.  History  Substance Use Topics  . Smoking status: Current Every Day Smoker    Types: Cigarettes  . Smokeless tobacco: Not on file  . Alcohol Use: Yes     Comment: every other day      Review of Systems  Musculoskeletal: Positive for arthralgias.  All other systems reviewed and are negative.    Allergies  Review of patient's allergies indicates no known allergies.  Home Medications  No current outpatient prescriptions on file.  BP 122/82  Pulse 86  Temp(Src) 98.2 F (36.8 C) (Oral)  Resp 18  SpO2 100%  Physical Exam  Nursing note and vitals reviewed. Constitutional: He is oriented to person, place, and time. He appears well-developed and well-nourished. No distress.  HENT:  Head: Normocephalic and atraumatic.  Eyes: Conjunctivae and EOM are normal.  Neck:  Normal range of motion. Neck supple.  Cardiovascular: Normal rate, regular rhythm, normal heart sounds and intact distal pulses.   Pulmonary/Chest: Effort normal and breath sounds normal. No respiratory distress.  Musculoskeletal: He exhibits tenderness.       Right ankle: He exhibits swelling (mild). He exhibits normal range of motion, no ecchymosis, no deformity and no laceration. No tenderness. Achilles tendon normal.       Right lower leg: He exhibits tenderness, bony tenderness and swelling. He exhibits no edema, no deformity and no laceration.       Legs: RLE swollen and TTP along tib/fib with associated bruising, distal pulses and sensation intact, compartment soft Mild swelling of right ankle, no TTP, ecchymosis, or deformity  Neurological: He is alert and oriented to person, place, and time.  Skin: Skin is warm and dry.  Psychiatric: He has a normal mood and affect.    ED Course  Procedures (including critical care time)  DIAGNOSTIC STUDIES: Oxygen Saturation is 100% on RA, normal by my interpretation.    COORDINATION OF CARE: 3:18 PM-Discussed treatment plan which includes Xray with pt at bedside and pt agreed to plan.   Labs Reviewed - No data to display Dg Tibia/fibula Right  07/24/2012   *RADIOLOGY REPORT*  Clinical Data: Leg pain.  RIGHT TIBIA AND FIBULA - 2 VIEW  Comparison: None.  Findings: There is an oblique fracture involving the distal diaphysis of the  right tibia.  There is also an oblique fracture involving the proximal diaphysis of the right fibula.  There is a mild posterior and lateral displacement of the distal fracture fragments.  IMPRESSION:  1.  Acute fractures involve the proximal shaft of the fibula and distal shaft of the tibia.   Original Report Authenticated By: Signa Kell, M.D.   Dg Ankle Complete Right  07/24/2012   *RADIOLOGY REPORT*  Clinical Data: Leg pain  RIGHT ANKLE - COMPLETE 3+ VIEW  Comparison: None.  Findings: There is an oblique fracture  through the distal right tibial diaphysis with lateral displacement of the distal fracture fragment and mild override.  Ankle mortise appears intact.  IMPRESSION: Oblique fracture through the distal right tibial diaphysis.   Original Report Authenticated By: Genevive Bi, M.D.     1. [place] fracture of fibula, right, closed, initial encounter   2. Fracture of tibia, distal, right, closed, initial encounter       MDM   X-ray as above- oblique fracture of right distal tibia and proximal fibula.  Consulted ortho, Dr. Lajoyce Corners, advised to place patient into a posterior ankle and sugar tong splint and keep nonweightbearing until office followup next week.  Rx Percocet. Discussed plan with patient, he agreed. Return precautions advised.  I personally performed the services described in this documentation, which was scribed in my presence. The recorded information has been reviewed and is accurate.   Garlon Hatchet, PA-C 07/24/12 1911

## 2012-07-24 NOTE — ED Notes (Signed)
Pt presents to department for evaluation of RLE pain. States he fell last night on R leg. Now states pain, swelling and redness to R lower extremity. 8/10 pain at the time. Pedal pulses present. Unable to bear weight on R leg. Pt is alert and oriented x4.

## 2012-07-24 NOTE — Progress Notes (Signed)
Orthopedic Tech Progress Note Patient Details:  Chaze Hruska [facility] 09-07-1985 [id number]  Ortho Devices Type of Ortho Device: Ace wrap;Crutches;Post (short leg) splint;Stirrup splint Ortho Device/Splint Location: right leg Ortho Device/Splint Interventions: Application   Juleah Paradise 07/24/2012, 5:56 PM

## 2012-07-29 ENCOUNTER — Encounter (HOSPITAL_COMMUNITY): Payer: Self-pay | Admitting: *Deleted

## 2012-07-29 ENCOUNTER — Other Ambulatory Visit (HOSPITAL_COMMUNITY): Payer: Self-pay | Admitting: Orthopedic Surgery

## 2012-07-29 NOTE — Progress Notes (Signed)
Spoke with Damon Alexander and asked her to have Dr.Duda sign orders

## 2012-07-30 MED ORDER — CEFAZOLIN SODIUM-DEXTROSE 2-3 GM-% IV SOLR
2.0000 g | INTRAVENOUS | Status: AC
  Administered 2012-07-31: 2 g via INTRAVENOUS
  Filled 2012-07-30: qty 50

## 2012-07-30 NOTE — Progress Notes (Signed)
While I was speaking with patient the phone went dead.  I have attempted to reach and the phone did not ring or said that it is not accepting calls.

## 2012-07-31 ENCOUNTER — Encounter (HOSPITAL_COMMUNITY): Payer: Self-pay | Admitting: Anesthesiology

## 2012-07-31 ENCOUNTER — Encounter (HOSPITAL_COMMUNITY): Payer: Self-pay | Admitting: Surgery

## 2012-07-31 ENCOUNTER — Ambulatory Visit (HOSPITAL_COMMUNITY): Payer: Self-pay | Admitting: Anesthesiology

## 2012-07-31 ENCOUNTER — Encounter (HOSPITAL_COMMUNITY)
Admission: RE | Disposition: A | Discharge status: Home / Self Care | Payer: Self-pay | Source: Ambulatory Visit | Attending: Orthopedic Surgery

## 2012-07-31 ENCOUNTER — Ambulatory Visit (HOSPITAL_COMMUNITY): Payer: Self-pay

## 2012-07-31 ENCOUNTER — Observation Stay (HOSPITAL_COMMUNITY)
Admission: RE | Admit: 2012-07-31 | Discharge: 2012-08-01 | Disposition: A | Discharge status: Home / Self Care | Payer: MEDICAID | Source: Ambulatory Visit | Attending: Orthopedic Surgery | Admitting: Orthopedic Surgery

## 2012-07-31 DIAGNOSIS — S82899A Other fracture of unspecified lower leg, initial encounter for closed fracture: Principal | ICD-10-CM | POA: Insufficient documentation

## 2012-07-31 DIAGNOSIS — X58XXXA Exposure to other specified factors, initial encounter: Secondary | ICD-10-CM | POA: Insufficient documentation

## 2012-07-31 DIAGNOSIS — S82201A Unspecified fracture of shaft of right tibia, initial encounter for closed fracture: Secondary | ICD-10-CM

## 2012-07-31 DIAGNOSIS — F17200 Nicotine dependence, unspecified, uncomplicated: Secondary | ICD-10-CM | POA: Insufficient documentation

## 2012-07-31 HISTORY — DX: Other specified health status: Z78.9

## 2012-07-31 HISTORY — PX: TIBIA IM NAIL INSERTION: SHX2516

## 2012-07-31 LAB — SURGICAL PCR SCREEN
MRSA, PCR: NEGATIVE
Staphylococcus aureus: NEGATIVE

## 2012-07-31 SURGERY — INSERTION, INTRAMEDULLARY ROD, TIBIA
Anesthesia: General | Site: Leg Lower | Laterality: Right | Wound class: Clean

## 2012-07-31 MED ORDER — ROCURONIUM BROMIDE 100 MG/10ML IV SOLN
INTRAVENOUS | Status: DC | PRN
  Administered 2012-07-31: 50 mg via INTRAVENOUS

## 2012-07-31 MED ORDER — ONDANSETRON HCL 4 MG/2ML IJ SOLN: 4.0000 mg | Freq: Four times a day (QID) | INTRAMUSCULAR | Status: DC | PRN

## 2012-07-31 MED ORDER — METOCLOPRAMIDE HCL 5 MG/ML IJ SOLN
5.0000 mg | Freq: Three times a day (TID) | INTRAMUSCULAR | Status: DC | PRN
  Administered 2012-07-31: 10 mg via INTRAVENOUS

## 2012-07-31 MED ORDER — HYDROMORPHONE HCL PF 1 MG/ML IJ SOLN
0.2500 mg | INTRAMUSCULAR | Status: DC | PRN
  Administered 2012-07-31 (×4): 0.5 mg via INTRAVENOUS

## 2012-07-31 MED ORDER — HYDROMORPHONE HCL PF 1 MG/ML IJ SOLN
INTRAMUSCULAR | Status: AC
  Filled 2012-07-31: qty 1

## 2012-07-31 MED ORDER — MUPIROCIN 2 % EX OINT: TOPICAL_OINTMENT | Freq: Two times a day (BID) | CUTANEOUS | Status: DC

## 2012-07-31 MED ORDER — FENTANYL CITRATE 0.05 MG/ML IJ SOLN
INTRAMUSCULAR | Status: DC | PRN
  Administered 2012-07-31: 150 ug via INTRAVENOUS
  Administered 2012-07-31: 100 ug via INTRAVENOUS
  Administered 2012-07-31 (×2): 25 ug via INTRAVENOUS

## 2012-07-31 MED ORDER — GLYCOPYRROLATE 0.2 MG/ML IJ SOLN
INTRAMUSCULAR | Status: DC | PRN
  Administered 2012-07-31: 0.4 mg via INTRAVENOUS

## 2012-07-31 MED ORDER — MUPIROCIN 2 % EX OINT
TOPICAL_OINTMENT | CUTANEOUS | Status: AC
  Administered 2012-07-31: 1 via NASAL
  Filled 2012-07-31: qty 22

## 2012-07-31 MED ORDER — OXYCODONE-ACETAMINOPHEN 5-325 MG PO TABS
1.0000 | ORAL_TABLET | ORAL | Status: DC | PRN
  Administered 2012-07-31 (×3): 2 via ORAL
  Administered 2012-08-01: 1 via ORAL
  Administered 2012-08-01: 2 via ORAL
  Administered 2012-08-01: 1 via ORAL
  Filled 2012-07-31 (×5): qty 2

## 2012-07-31 MED ORDER — ASPIRIN EC 325 MG PO TBEC
325.0000 mg | DELAYED_RELEASE_TABLET | Freq: Every day | ORAL | Status: DC
  Administered 2012-07-31: 325 mg via ORAL
  Filled 2012-07-31 (×2): qty 1

## 2012-07-31 MED ORDER — ONDANSETRON HCL 4 MG PO TABS: 4.0000 mg | ORAL_TABLET | Freq: Four times a day (QID) | ORAL | Status: DC | PRN

## 2012-07-31 MED ORDER — METOCLOPRAMIDE HCL 5 MG/ML IJ SOLN
INTRAMUSCULAR | Status: AC
  Filled 2012-07-31: qty 2

## 2012-07-31 MED ORDER — ARTIFICIAL TEARS OP OINT
TOPICAL_OINTMENT | OPHTHALMIC | Status: DC | PRN
  Administered 2012-07-31: 1 via OPHTHALMIC

## 2012-07-31 MED ORDER — ONDANSETRON HCL 4 MG/2ML IJ SOLN
INTRAMUSCULAR | Status: DC | PRN
  Administered 2012-07-31: 4 mg via INTRAVENOUS

## 2012-07-31 MED ORDER — HYDROMORPHONE HCL PF 1 MG/ML IJ SOLN
0.5000 mg | INTRAMUSCULAR | Status: DC | PRN
  Administered 2012-07-31 – 2012-08-01 (×5): 1 mg via INTRAVENOUS
  Filled 2012-07-31 (×5): qty 1

## 2012-07-31 MED ORDER — MIDAZOLAM HCL 5 MG/5ML IJ SOLN
INTRAMUSCULAR | Status: DC | PRN
  Administered 2012-07-31: 2 mg via INTRAVENOUS

## 2012-07-31 MED ORDER — NEOSTIGMINE METHYLSULFATE 1 MG/ML IJ SOLN
INTRAMUSCULAR | Status: DC | PRN
  Administered 2012-07-31: 3 mg via INTRAVENOUS

## 2012-07-31 MED ORDER — OXYCODONE HCL 5 MG PO TABS: 5.0000 mg | ORAL_TABLET | Freq: Once | ORAL | Status: DC | PRN

## 2012-07-31 MED ORDER — OXYCODONE-ACETAMINOPHEN 5-325 MG PO TABS
ORAL_TABLET | ORAL | Status: AC
  Filled 2012-07-31: qty 2

## 2012-07-31 MED ORDER — LACTATED RINGERS IV SOLN
INTRAVENOUS | Status: DC | PRN
  Administered 2012-07-31 (×2): via INTRAVENOUS

## 2012-07-31 MED ORDER — SODIUM CHLORIDE 0.9 % IV SOLN
INTRAVENOUS | Status: DC
  Administered 2012-07-31: 11:00:00 via INTRAVENOUS

## 2012-07-31 MED ORDER — HYDROCODONE-ACETAMINOPHEN 5-325 MG PO TABS
1.0000 | ORAL_TABLET | ORAL | Status: DC | PRN
  Administered 2012-07-31 (×2): 2 via ORAL
  Filled 2012-07-31 (×2): qty 2

## 2012-07-31 MED ORDER — CEFAZOLIN SODIUM 1-5 GM-% IV SOLN
1.0000 g | Freq: Four times a day (QID) | INTRAVENOUS | Status: AC
  Administered 2012-07-31 – 2012-08-01 (×3): 1 g via INTRAVENOUS
  Filled 2012-07-31 (×3): qty 50

## 2012-07-31 MED ORDER — OXYCODONE HCL 5 MG/5ML PO SOLN: 5.0000 mg | Freq: Once | ORAL | Status: DC | PRN

## 2012-07-31 MED ORDER — PROPOFOL 10 MG/ML IV BOLUS
INTRAVENOUS | Status: DC | PRN
  Administered 2012-07-31: 150 mg via INTRAVENOUS

## 2012-07-31 MED ORDER — LIDOCAINE HCL (CARDIAC) 20 MG/ML IV SOLN
INTRAVENOUS | Status: DC | PRN
  Administered 2012-07-31: 50 mg via INTRAVENOUS

## 2012-07-31 MED ORDER — METOCLOPRAMIDE HCL 10 MG PO TABS: 5.0000 mg | ORAL_TABLET | Freq: Three times a day (TID) | ORAL | Status: DC | PRN

## 2012-07-31 SURGICAL SUPPLY — 51 items
BANDAGE ELASTIC 6 VELCRO ST LF (GAUZE/BANDAGES/DRESSINGS) IMPLANT
BANDAGE GAUZE ELAST BULKY 4 IN (GAUZE/BANDAGES/DRESSINGS) ×2 IMPLANT
BIT DRILL LONG 4.0 (BIT) ×1 IMPLANT
BIT DRILL SHORT 4.0 (BIT) ×1 IMPLANT
BLADE SURG 10 STRL SS (BLADE) ×2 IMPLANT
BNDG COHESIVE 4X5 TAN STRL (GAUZE/BANDAGES/DRESSINGS) ×2 IMPLANT
BNDG COHESIVE 6X5 TAN STRL LF (GAUZE/BANDAGES/DRESSINGS) ×2 IMPLANT
CLOTH BEACON ORANGE TIMEOUT ST (SAFETY) ×2 IMPLANT
COVER SURGICAL LIGHT HANDLE (MISCELLANEOUS) ×4 IMPLANT
CUFF TOURNIQUET SINGLE 34IN LL (TOURNIQUET CUFF) IMPLANT
CUFF TOURNIQUET SINGLE 44IN (TOURNIQUET CUFF) IMPLANT
DRAPE C-ARM MINI 42X72 WSTRAPS (DRAPES) IMPLANT
DRAPE U-SHAPE 47X51 STRL (DRAPES) ×2 IMPLANT
DRILL BIT LONG 4.0 (BIT) ×2
DRILL BIT SHORT 4.0 (BIT) ×1
DRSG ADAPTIC 3X8 NADH LF (GAUZE/BANDAGES/DRESSINGS) ×2 IMPLANT
DRSG PAD ABDOMINAL 8X10 ST (GAUZE/BANDAGES/DRESSINGS) ×2 IMPLANT
ELECT REM PT RETURN 9FT ADLT (ELECTROSURGICAL) ×2
ELECTRODE REM PT RTRN 9FT ADLT (ELECTROSURGICAL) ×1 IMPLANT
GLOVE BIOGEL PI IND STRL 9 (GLOVE) ×1 IMPLANT
GLOVE BIOGEL PI INDICATOR 9 (GLOVE) ×1
GLOVE SURG ORTHO 9.0 STRL STRW (GLOVE) ×2 IMPLANT
GOWN PREVENTION PLUS XLARGE (GOWN DISPOSABLE) ×2 IMPLANT
GOWN SRG XL XLNG 56XLVL 4 (GOWN DISPOSABLE) ×2 IMPLANT
GOWN STRL NON-REIN XL XLG LVL4 (GOWN DISPOSABLE) ×2
GUIDE PIN 3.2MM (MISCELLANEOUS) ×1
GUIDE PIN ORTH 343X3.2XBRAD (MISCELLANEOUS) ×1 IMPLANT
GUIDE ROD 3.0 (MISCELLANEOUS) ×4
KIT BASIN OR (CUSTOM PROCEDURE TRAY) ×2 IMPLANT
KIT ROOM TURNOVER OR (KITS) ×2 IMPLANT
NAIL TIBIAL 10X38 (Nail) ×2 IMPLANT
NS IRRIG 1000ML POUR BTL (IV SOLUTION) ×2 IMPLANT
PACK ORTHO EXTREMITY (CUSTOM PROCEDURE TRAY) ×2 IMPLANT
PAD ARMBOARD 7.5X6 YLW CONV (MISCELLANEOUS) ×4 IMPLANT
PAD CAST 4YDX4 CTTN HI CHSV (CAST SUPPLIES) IMPLANT
PADDING CAST COTTON 4X4 STRL (CAST SUPPLIES)
PENCIL BUTTON HOLSTER BLD 10FT (ELECTRODE) ×2 IMPLANT
ROD GUIDE 3.0 (MISCELLANEOUS) ×2 IMPLANT
SCREW TRIGEN LOW PROF 5.0X37.5 (Screw) ×2 IMPLANT
SPONGE GAUZE 4X4 12PLY (GAUZE/BANDAGES/DRESSINGS) ×2 IMPLANT
SPONGE LAP 18X18 X RAY DECT (DISPOSABLE) ×4 IMPLANT
STAPLER VISISTAT 35W (STAPLE) ×2 IMPLANT
STOCKINETTE IMPERVIOUS LG (DRAPES) ×2 IMPLANT
SUCTION FRAZIER TIP 10 FR DISP (SUCTIONS) ×2 IMPLANT
SUT VIC AB 0 CTB1 27 (SUTURE) ×2 IMPLANT
SUT VIC AB 2-0 CTB1 (SUTURE) ×2 IMPLANT
SYR BULB IRRIGATION 50ML (SYRINGE) ×2 IMPLANT
TOWEL OR 17X24 6PK STRL BLUE (TOWEL DISPOSABLE) ×2 IMPLANT
TOWEL OR 17X26 10 PK STRL BLUE (TOWEL DISPOSABLE) ×2 IMPLANT
TUBE CONNECTING 12X1/4 (SUCTIONS) ×2 IMPLANT
WATER STERILE IRR 1000ML POUR (IV SOLUTION) ×2 IMPLANT
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
## 2012-07-31 NOTE — Preoperative (Signed)
Beta Blockers   Reason not to administer Beta Blockers:Not Applicable 

## 2012-07-31 NOTE — Transfer of Care (Signed)
Immediate Anesthesia Transfer of Care Note  Patient: Damon Alexander  Procedure(s) Performed: Procedure(s) with comments: INTRAMEDULLARY (IM) NAIL TIBIAL (Right) - Intramedullary Nail right tib/fib  Patient Location: PACU  Anesthesia Type:General  Level of Consciousness: awake, alert , oriented and patient cooperative  Airway & Oxygen Therapy: Patient Spontanous Breathing and Patient connected to face mask oxygen  Post-op Assessment: Report given to PACU RN, Post -op Vital signs reviewed and stable and Patient moving all extremities  Post vital signs: Reviewed and stable  Complications: No apparent anesthesia complications

## 2012-07-31 NOTE — Anesthesia Preprocedure Evaluation (Signed)
Anesthesia Evaluation  Patient identified by MRN, date of birth, ID band Patient awake    Reviewed: Allergy & Precautions, H&P , NPO status , Patient's Chart, lab work & pertinent test results  Airway Mallampati: I TM Distance: >3 FB Neck ROM: Full    Dental no notable dental hx. (+) Teeth Intact and Dental Advisory Given   Pulmonary neg pulmonary ROS,  breath sounds clear to auscultation  Pulmonary exam normal       Cardiovascular negative cardio ROS  Rhythm:Regular Rate:Normal     Neuro/Psych negative neurological ROS  negative psych ROS   GI/Hepatic negative GI ROS, Neg liver ROS,   Endo/Other  negative endocrine ROS  Renal/GU negative Renal ROS  negative genitourinary   Musculoskeletal   Abdominal   Peds  Hematology negative hematology ROS (+)   Anesthesia Other Findings   Reproductive/Obstetrics negative OB ROS                           Anesthesia Physical Anesthesia Plan  ASA: I  Anesthesia Plan: General   Post-op Pain Management:    Induction: Intravenous  Airway Management Planned: Oral ETT  Additional Equipment:   Intra-op Plan:   Post-operative Plan: Extubation in OR  Informed Consent: I have reviewed the patients History and Physical, chart, labs and discussed the procedure including the risks, benefits and alternatives for the proposed anesthesia with the patient or authorized representative who has indicated his/her understanding and acceptance.   Dental advisory given  Plan Discussed with: CRNA  Anesthesia Plan Comments:         Anesthesia Quick Evaluation

## 2012-07-31 NOTE — H&P (Signed)
Damon Alexander is an 27 y.o. male.   Chief Complaint: Closed right tibia fracture HPI: Patient is a 27 year old gentleman who sustained a displaced closed right tibia fracture and presents at this time for internal fixation.  Past Medical History  Diagnosis Date  . Medical history non-contributory     Past Surgical History  Procedure Laterality Date  . No past surgeries      No family history on file. Social History:  reports that he has been smoking Cigarettes.  He has been smoking about 0.00 packs per day. He does not have any smokeless tobacco history on file. He reports that  drinks alcohol. He reports that he does not use illicit drugs.  Allergies: No Known Allergies  Medications Prior to Admission  Medication Sig Dispense Refill  . oxyCODONE-acetaminophen (PERCOCET/ROXICET) 5-325 MG per tablet Take 1 tablet by mouth every 4 (four) hours as needed for pain.  20 tablet  0    No results found for this or any previous visit (from the past 48 hour(s)). No results found.  Review of Systems  All other systems reviewed and are negative.    Blood pressure 126/86, pulse 89, temperature 97.5 F (36.4 C), temperature source Oral, resp. rate 18, height 5' 9.5" (1.765 m), weight 81.647 kg (180 lb), SpO2 97.00%. Physical Exam  On examination patient has palpable pulses. He has an unstable fracture. Skin is intact. Radiographs shows a unstable fracture of the right tibia Assessment/Plan Assessment: Right tibia fracture.  Plan: Will plan for intramedullary nail fixation. Risks and benefits were discussed including infection neurovascular injury pain DVT need for additional surgery. Patient states he understands was pursued this time.  Mayia Megill V 07/31/2012, 6:59 AM

## 2012-07-31 NOTE — Anesthesia Procedure Notes (Signed)
Procedure Name: Intubation Date/Time: 07/31/2012 8:39 AM Performed by: Jerilee Hoh Pre-anesthesia Checklist: Emergency Drugs available, Patient identified, Suction available and Patient being monitored Patient Re-evaluated:Patient Re-evaluated prior to inductionOxygen Delivery Method: Circle system utilized Preoxygenation: Pre-oxygenation with 100% oxygen Intubation Type: IV induction Ventilation: Mask ventilation without difficulty Laryngoscope Size: Mac and 4 Grade View: Grade I Tube type: Oral Tube size: 7.5 mm Number of attempts: 1 Airway Equipment and Method: Stylet Placement Confirmation: ETT inserted through vocal cords under direct vision,  positive ETCO2 and breath sounds checked- equal and bilateral Secured at: 23 cm Tube secured with: Tape Dental Injury: Teeth and Oropharynx as per pre-operative assessment

## 2012-07-31 NOTE — Progress Notes (Signed)
Plan for discharge to home Sunday. Patient has prescription for Percocet at home.

## 2012-07-31 NOTE — Progress Notes (Signed)
Orthopedic Tech Progress Note Patient Details:  Mivaan Corbitt [facility] [birth year] [id number] Applied CAM walker to RLE. Ortho Devices Type of Ortho Device: CAM walker Ortho Device/Splint Location: RLE Ortho Device/Splint Interventions: Application   Lesle Chris 07/31/2012, 12:33 PM

## 2012-07-31 NOTE — Anesthesia Postprocedure Evaluation (Signed)
  Anesthesia Post-op Note  Patient: Damon Alexander  Procedure(s) Performed: Procedure(s) with comments: INTRAMEDULLARY (IM) NAIL TIBIAL (Right) - Intramedullary Nail right tib/fib  Patient Location: PACU  Anesthesia Type:General  Level of Consciousness: awake  Airway and Oxygen Therapy: Patient Spontanous Breathing and Patient connected to nasal cannula oxygen  Post-op Pain: none  Post-op Assessment: Post-op Vital signs reviewed, Patient's Cardiovascular Status Stable, Respiratory Function Stable, Patent Airway and No signs of Nausea or vomiting  Post-op Vital Signs: Reviewed and stable  Complications: No apparent anesthesia complications

## 2012-08-01 MED ORDER — OXYCODONE-ACETAMINOPHEN 5-325 MG PO TABS: 2.0000 | ORAL_TABLET | ORAL | Status: DC | PRN

## 2012-08-01 NOTE — Progress Notes (Signed)
Pt has required dilaudid IV during the night because his pain has been at a 9 or 10. Family member is concerned about him going home today with the amount of pain he has been having.

## 2012-08-01 NOTE — Progress Notes (Signed)
Patient DC'd in stable condition - offered to go get a wheelchair, returned with it and patient had walked out on crutches.DC'd with [employer],belongings.

## 2012-08-01 NOTE — Progress Notes (Signed)
Subjective: 1 Day Post-Op Procedure(s) (LRB): INTRAMEDULLARY (IM) NAIL TIBIAL (Right) Patient reports pain as moderate.    Objective: Vital signs in last 24 hours: Temp:  [97.6 F (36.4 C)-99 F (37.2 C)] 97.6 F (36.4 C) (06/29 0646) Pulse Rate:  [63-89] 89 (06/29 0646) Resp:  [11-16] 16 (06/29 0646) BP: (124-153)/(55-92) 130/63 mmHg (06/29 0646) SpO2:  [98 %-100 %] 99 % (06/29 0646)  Intake/Output from previous day: 06/28 0701 - 06/29 0700 In: 2420 [P.O.:1120; I.V.:1300] Out: 150 [Urine:150] Intake/Output this shift:    No results found for this basename: HGB,  in the last 72 hours No results found for this basename: WBC, RBC, HCT, PLT,  in the last 72 hours No results found for this basename: NA, K, CL, CO2, BUN, CREATININE, GLUCOSE, CALCIUM,  in the last 72 hours No results found for this basename: LABPT, INR,  in the last 72 hours  Sensation intact distally Intact pulses distally Dorsiflexion/Plantar flexion intact Compartment soft  Assessment/Plan: 1 Day Post-Op Procedure(s) (LRB): INTRAMEDULLARY (IM) NAIL TIBIAL (Right) D/C to home today.  Damon Alexander Y 08/01/2012, 9:17 AM

## 2012-08-01 NOTE — Discharge Summary (Signed)
Patient ID: DUANTE AROCHO MRN: [id number] DOB/AGE: 06/24/1985 27 y.o.  Admit date: 07/31/2012 Discharge date: 08/01/2012  Admission Diagnoses:  Active Problems:   * No active hospital problems. *   Discharge Diagnoses:  Same  Past Medical History  Diagnosis Date  . Medical history non-contributory     Surgeries: Procedure(s): INTRAMEDULLARY (IM) NAIL TIBIAL on 07/31/2012   Consultants:    Discharged Condition: Improved  Hospital Course: ANTWON ROCHIN is an 27 y.o. male who was admitted 07/31/2012 for operative treatment of<principal problem not specified>. Patient has severe unremitting pain that affects sleep, daily activities, and work/hobbies. After pre-op clearance the patient was taken to the operating room on 07/31/2012 and underwent  Procedure(s): INTRAMEDULLARY (IM) NAIL TIBIAL.    Patient was given perioperative antibiotics: Anti-infectives   Start     Dose/Rate Route Frequency Ordered Stop   07/31/12 1400  ceFAZolin (ANCEF) IVPB 1 g/50 mL premix     1 g 100 mL/hr over 30 Minutes Intravenous Every 6 hours 07/31/12 1058 08/01/12 0255   07/31/12 0600  ceFAZolin (ANCEF) IVPB 2 g/50 mL premix     2 g 100 mL/hr over 30 Minutes Intravenous On call to O.R. 07/30/12 1426 07/31/12 0843       Patient was given sequential compression devices, early ambulation, and chemoprophylaxis to prevent DVT.  Patient benefited maximally from hospital stay and there were no complications.    Recent vital signs: Patient Vitals for the past 24 hrs:  BP Temp Temp src Pulse Resp SpO2  08/01/12 0646 130/63 mmHg 97.6 F (36.4 C) - 89 16 99 %  07/31/12 2104 124/55 mmHg 99 F (37.2 C) - 74 15 99 %  07/31/12 1454 129/61 mmHg 98.1 F (36.7 C) Oral 63 14 100 %  07/31/12 1055 135/74 mmHg 97.8 F (36.6 C) Oral 68 12 100 %  07/31/12 1030 153/92 mmHg - - 69 11 100 %  07/31/12 1015 146/74 mmHg - - 86 15 98 %  07/31/12 1000 151/86 mmHg - - 63 14 100 %  07/31/12 0945 145/56 mmHg 97.9 F  (36.6 C) - 82 13 100 %     Recent laboratory studies: No results found for this basename: WBC, HGB, HCT, PLT, NA, K, CL, CO2, BUN, CREATININE, GLUCOSE, PT, INR, CALCIUM, 2,  in the last 72 hours   Discharge Medications:     Medication List         oxyCODONE-acetaminophen 5-325 MG per tablet  Commonly known as:  PERCOCET/ROXICET  Take 2 tablets by mouth every 4 (four) hours as needed for pain.        Diagnostic Studies: Dg Tibia/fibula Right  07/31/2012   *RADIOLOGY REPORT*  Clinical Data: Status post ORIF.  RIGHT TIBIA AND FIBULA - 2 VIEW  Comparison: Ankle and tibia films 07/24/2012.  Findings: The patient is status post ORIF.  An intramedullary rod is in place with a single proximal interlocking screw.  Near anatomic alignment is achieved through the distal butterfly fracture of the tibia.  The proximal fibular fracture is not imaged.  IMPRESSION: Near anatomic reduction of the distal tibia fracture status post placement of an intramedullary rod.  No radiographic evidence for complication.   Original Report Authenticated By: Marin Roberts, M.D.   Dg Tibia/fibula Right  07/24/2012   *RADIOLOGY REPORT*  Clinical Data: Leg pain.  RIGHT TIBIA AND FIBULA - 2 VIEW  Comparison: None.  Findings: There is an oblique fracture involving the distal diaphysis of the right tibia.  There is also an oblique fracture involving the proximal diaphysis of the right fibula.  There is a mild posterior and lateral displacement of the distal fracture fragments.  IMPRESSION:  1.  Acute fractures involve the proximal shaft of the fibula and distal shaft of the tibia.   Original Report Authenticated By: Signa Kell, M.D.   Dg Ankle Complete Right  07/24/2012   *RADIOLOGY REPORT*  Clinical Data: Leg pain  RIGHT ANKLE - COMPLETE 3+ VIEW  Comparison: None.  Findings: There is an oblique fracture through the distal right tibial diaphysis with lateral displacement of the distal fracture fragment and mild  override.  Ankle mortise appears intact.  IMPRESSION: Oblique fracture through the distal right tibial diaphysis.   Original Report Authenticated By: Genevive Bi, M.D.    Disposition: 01-Home or Self Care      Discharge Orders   Future Orders Complete By Expires     Call MD / Call 911  As directed     Comments:      If you experience chest pain or shortness of breath, CALL 911 and be transported to the hospital emergency room.  If you develope a fever above 101 F, pus (white drainage) or increased drainage or redness at the wound, or calf pain, call your surgeon's office.    Constipation Prevention  As directed     Comments:      Drink plenty of fluids.  Prune juice may be helpful.  You may use a stool softener, such as Colace (over the counter) 100 mg twice a day.  Use MiraLax (over the counter) for constipation as needed.    Diet - low sodium heart healthy  As directed     Discharge instructions  As directed     Comments:      No weight on your right leg. Elevation and ice for swelling. Leave your current dressing on and clean/dry for the next 5 days. In 5 days, you can remove the dressings and get your incisions wet in the shower; then new dry dressing or large band-aids daily.    Discharge patient  As directed     Increase activity slowly as tolerated  As directed        Follow-up Information   Follow up with DUDA,MARCUS V, MD In 2 weeks.   Contact information:   [address] [place] [address] [phone]        Signed: Kathryne Hitch 08/01/2012, 9:20 AM

## 2012-08-01 NOTE — Care Management Utilization Note (Signed)
Utilization review completed.  

## 2012-08-03 ENCOUNTER — Encounter (HOSPITAL_COMMUNITY): Payer: Self-pay | Admitting: Orthopedic Surgery

## 2013-05-30 ENCOUNTER — Encounter (HOSPITAL_COMMUNITY): Payer: Self-pay | Admitting: Emergency Medicine

## 2013-05-30 ENCOUNTER — Emergency Department (HOSPITAL_COMMUNITY)
Admission: EM | Admit: 2013-05-30 | Discharge: 2013-05-31 | Disposition: A | Discharge status: Home / Self Care | Payer: Self-pay | Attending: Emergency Medicine | Admitting: Emergency Medicine

## 2013-05-30 ENCOUNTER — Emergency Department (HOSPITAL_COMMUNITY): Payer: Self-pay

## 2013-05-30 DIAGNOSIS — S93409A Sprain of unspecified ligament of unspecified ankle, initial encounter: Secondary | ICD-10-CM | POA: Insufficient documentation

## 2013-05-30 DIAGNOSIS — Y939 Activity, unspecified: Secondary | ICD-10-CM | POA: Insufficient documentation

## 2013-05-30 DIAGNOSIS — S93402A Sprain of unspecified ligament of left ankle, initial encounter: Secondary | ICD-10-CM

## 2013-05-30 DIAGNOSIS — Y929 Unspecified place or not applicable: Secondary | ICD-10-CM | POA: Insufficient documentation

## 2013-05-30 DIAGNOSIS — F17200 Nicotine dependence, unspecified, uncomplicated: Secondary | ICD-10-CM | POA: Insufficient documentation

## 2013-05-30 DIAGNOSIS — X500XXA Overexertion from strenuous movement or load, initial encounter: Secondary | ICD-10-CM | POA: Insufficient documentation

## 2013-05-30 NOTE — ED Notes (Signed)
Patient smells of ETOH 

## 2013-05-30 NOTE — ED Notes (Signed)
Patient complaining of left ankle pain, denies known injury.

## 2013-05-31 MED ORDER — IBUPROFEN 600 MG PO TABS: 600.0000 mg | ORAL_TABLET | Freq: Four times a day (QID) | ORAL | Status: DC | PRN

## 2013-05-31 MED ORDER — IBUPROFEN 800 MG PO TABS
800.0000 mg | ORAL_TABLET | Freq: Once | ORAL | Status: AC
  Administered 2013-05-31: 800 mg via ORAL
  Filled 2013-05-31: qty 1

## 2013-05-31 MED ORDER — OXYCODONE-ACETAMINOPHEN 5-325 MG PO TABS: 1.0000 | ORAL_TABLET | ORAL | Status: DC | PRN

## 2013-05-31 MED ORDER — HYDROCODONE-ACETAMINOPHEN 5-325 MG PO TABS: 1.0000 | ORAL_TABLET | Freq: Once | ORAL | Status: DC

## 2013-05-31 MED ORDER — OXYCODONE-ACETAMINOPHEN 5-325 MG PO TABS
1.0000 | ORAL_TABLET | Freq: Once | ORAL | Status: AC
  Administered 2013-05-31: 1 via ORAL
  Filled 2013-05-31: qty 1

## 2013-05-31 NOTE — ED Provider Notes (Signed)
Medical screening examination/treatment/procedure(s) were performed by non-physician practitioner and as supervising physician I was immediately available for consultation/collaboration.   EKG Interpretation None        Joya Gaskinsonald W Soliyana Mcchristian, MD 05/31/13 2302

## 2013-05-31 NOTE — Discharge Instructions (Signed)

## 2013-05-31 NOTE — ED Provider Notes (Signed)
CSN: [id number]     Arrival date & time 05/30/13  2150 History   First MD Initiated Contact with Patient 05/30/13 2343     Chief Complaint  Patient presents with  . Ankle Pain     (Consider location/radiation/quality/duration/timing/severity/associated sxs/prior Treatment) The history is provided by the patient.    Damon Alexander is a 28 y.o. male presenting with left ankle pain which occurred suddenly when the patient tripped in a pothole inverting his ankle, which occurred just prior to arrival.  Pain is aching, constant and worse with palpation, movement and weight bearing.  The patient was able to weight bear immediately after the event.  There is no radiation of pain and the patient denies numbness distal to the injury site.  He had no treatment prior to arrival.  He is accompanied by his girlfriend.   Past Medical History  Diagnosis Date  . Medical history non-contributory    Past Surgical History  Procedure Laterality Date  . No past surgeries    . Tibia im nail insertion Right 07/31/2012    Procedure: INTRAMEDULLARY (IM) NAIL TIBIAL;  Surgeon: Nadara MustardMarcus V Duda, MD;  Location: [facility] OR;  Service: Orthopedics;  Laterality: Right;  Intramedullary Nail right tib/fib   History reviewed. No pertinent family history. History  Substance Use Topics  . Smoking status: Current Every Day Smoker    Types: Cigarettes  . Smokeless tobacco: Not on file  . Alcohol Use: Yes     Comment: every other day    Review of Systems  Musculoskeletal: Positive for arthralgias and joint swelling.  Skin: Negative for wound.  Neurological: Negative for weakness and numbness.      Allergies  Review of patient's allergies indicates no known allergies.  Home Medications   Prior to Admission medications   Medication Sig Start Date End Date Taking? Authorizing Provider  ibuprofen (ADVIL,MOTRIN) 600 MG tablet Take 1 tablet (600 mg total) by mouth every 6 (six) hours as needed. 05/31/13   Burgess AmorJulie Laysha Childers,  PA-C  oxyCODONE-acetaminophen (PERCOCET/ROXICET) 5-325 MG per tablet Take 2 tablets by mouth every 4 (four) hours as needed for pain. 08/01/12   Kathryne Hitchhristopher Y Blackman, MD  oxyCODONE-acetaminophen (PERCOCET/ROXICET) 5-325 MG per tablet Take 1 tablet by mouth every 4 (four) hours as needed for severe pain. 05/31/13   Burgess AmorJulie Shannah Conteh, PA-C   BP 134/93  Pulse 112  Temp(Src) 98.2 F (36.8 C) (Oral)  Resp 18  Ht 5\' 9"  (1.753 m)  Wt 190 lb (86.183 kg)  BMI 28.05 kg/m2  SpO2 96% Physical Exam  Nursing note and vitals reviewed. Constitutional: He appears well-developed and well-nourished.  HENT:  Head: Normocephalic.  Cardiovascular: Normal rate and intact distal pulses.  Exam reveals no decreased pulses.   Pulses:      Dorsalis pedis pulses are 2+ on the right side, and 2+ on the left side.       Posterior tibial pulses are 2+ on the right side, and 2+ on the left side.  Musculoskeletal: He exhibits edema and tenderness.       Left ankle: He exhibits decreased range of motion and swelling. He exhibits no ecchymosis, no deformity and normal pulse. Tenderness. Lateral malleolus tenderness found. No head of 5th metatarsal and no proximal fibula tenderness found. Achilles tendon normal.  Neurological: He is alert. No sensory deficit.  Skin: Skin is warm, dry and intact.    ED Course  Procedures (including critical care time) Labs Review Labs Reviewed - No data to display  Imaging Review Dg Ankle Complete Left  05/31/2013   CLINICAL DATA:  Lateral left ankle pain status post twisting injury.  EXAM: LEFT ANKLE COMPLETE - 3+ VIEW  COMPARISON:  None.  FINDINGS: Three views of the left ankle reveal the joint mortise to be preserved. The talar dome is intact. There is no evidence of an acute malleolar fracture. The talus and calcaneus appear intact. There is mild soft tissue swelling over the lateral malleolus and distal fibular metadiaphysis.  IMPRESSION: There is no evidence of an acute fracture nor  dislocation of the left ankle.   Electronically Signed   By: David  Swaziland[place]   On: 05/31/2013 00:08     EKG Interpretation None      MDM   Final diagnoses:  Left ankle sprain    Pt placed in aso, crutches supplied, oxycodone prescribed. Encouraged RICE,  F/u with Dr. Lajoyce Cornersuda (who he has seen in the past for injury to his right le.  Patients labs and/or radiological studies were viewed and considered during the medical decision making and disposition process.  The patient appears reasonably screened and/or stabilized for discharge and I doubt any other medical condition or other [facility] requiring further screening, evaluation, or treatment in the ED at this time prior to discharge.     Burgess AmorJulie Zamirah Denny, PA-C 05/31/13 1222

## 2013-06-05 ENCOUNTER — Emergency Department (HOSPITAL_COMMUNITY)
Admission: EM | Admit: 2013-06-05 | Discharge: 2013-06-06 | Disposition: A | Discharge status: Home / Self Care | Payer: Self-pay | Attending: Emergency Medicine | Admitting: Emergency Medicine

## 2013-06-05 ENCOUNTER — Encounter (HOSPITAL_COMMUNITY): Payer: Self-pay | Admitting: Emergency Medicine

## 2013-06-05 ENCOUNTER — Emergency Department (HOSPITAL_COMMUNITY)
Admission: EM | Admit: 2013-06-05 | Discharge: 2013-06-05 | Discharge status: Against Medical Advice | Payer: Self-pay | Attending: Emergency Medicine | Admitting: Emergency Medicine

## 2013-06-05 DIAGNOSIS — S02609B Fracture of mandible, unspecified, initial encounter for open fracture: Secondary | ICD-10-CM

## 2013-06-05 DIAGNOSIS — F17200 Nicotine dependence, unspecified, uncomplicated: Secondary | ICD-10-CM | POA: Insufficient documentation

## 2013-06-05 DIAGNOSIS — Y929 Unspecified place or not applicable: Secondary | ICD-10-CM | POA: Insufficient documentation

## 2013-06-05 DIAGNOSIS — Y939 Activity, unspecified: Secondary | ICD-10-CM | POA: Insufficient documentation

## 2013-06-05 DIAGNOSIS — R6889 Other general symptoms and signs: Secondary | ICD-10-CM | POA: Insufficient documentation

## 2013-06-05 DIAGNOSIS — S0993XA Unspecified injury of face, initial encounter: Secondary | ICD-10-CM | POA: Insufficient documentation

## 2013-06-05 DIAGNOSIS — S199XXA Unspecified injury of neck, initial encounter: Principal | ICD-10-CM

## 2013-06-05 DIAGNOSIS — W108XXA Fall (on) (from) other stairs and steps, initial encounter: Secondary | ICD-10-CM | POA: Insufficient documentation

## 2013-06-05 DIAGNOSIS — S02600B Fracture of unspecified part of body of mandible, initial encounter for open fracture: Secondary | ICD-10-CM | POA: Insufficient documentation

## 2013-06-05 DIAGNOSIS — R1310 Dysphagia, unspecified: Secondary | ICD-10-CM | POA: Insufficient documentation

## 2013-06-05 DIAGNOSIS — W19XXXA Unspecified fall, initial encounter: Secondary | ICD-10-CM | POA: Insufficient documentation

## 2013-06-05 DIAGNOSIS — Y9389 Activity, other specified: Secondary | ICD-10-CM | POA: Insufficient documentation

## 2013-06-05 DIAGNOSIS — K089 Disorder of teeth and supporting structures, unspecified: Secondary | ICD-10-CM | POA: Insufficient documentation

## 2013-06-05 NOTE — ED Notes (Signed)
Patient reports fell on concrete today. Complaining of mouth pain, throat swelling, trouble swallowing. Patient spitting out blood in triage. Also reports spit out part of a tooth.

## 2013-06-05 NOTE — ED Provider Notes (Signed)
CSN: [id number]     Arrival date & time 06/05/13  2328 History   First MD Initiated Contact with Patient 06/05/13 2356     Chief Complaint  Patient presents with  . Fall     (Consider location/radiation/quality/duration/timing/severity/associated sxs/prior Treatment) HPI  Damon Alexander is a 28 y.o. male complaining of complaining of severe pain to left jaw status post slip and fall down 5 steps onto a concrete sidewalk. Patient has blood coming from the mouth, and he denies any headache, neck pain, difficulty breathing, chest pain, shortness of breath. Endorses what he is  Past Medical History  Diagnosis Date  . Medical history non-contributory    Past Surgical History  Procedure Laterality Date  . No past surgeries    . Tibia im nail insertion Right 07/31/2012    Procedure: INTRAMEDULLARY (IM) NAIL TIBIAL;  Surgeon: Nadara MustardMarcus V Duda, MD;  Location: [facility] OR;  Service: Orthopedics;  Laterality: Right;  Intramedullary Nail right tib/fib   No family history on file. History  Substance Use Topics  . Smoking status: Current Every Day Smoker    Types: Cigarettes  . Smokeless tobacco: Not on file  . Alcohol Use: Yes     Comment: every other day    Review of Systems  10 systems reviewed and found to be negative, except as noted in the HPI.  Allergies  Review of patient's allergies indicates no known allergies.  Home Medications   Prior to Admission medications   Medication Sig Start Date End Date Taking? Authorizing Provider  ibuprofen (ADVIL,MOTRIN) 600 MG tablet Take 1 tablet (600 mg total) by mouth every 6 (six) hours as needed. 05/31/13   Burgess AmorJulie Idol, PA-C  oxyCODONE-acetaminophen (PERCOCET/ROXICET) 5-325 MG per tablet Take 1 tablet by mouth every 4 (four) hours as needed for severe pain. 05/31/13   Burgess AmorJulie Idol, PA-C   BP 124/70  Pulse 77  Temp(Src) 98.1 F (36.7 C) (Rectal)  Resp 18  SpO2 92% Physical Exam  Nursing note and vitals reviewed. Constitutional: He is oriented  to person, place, and time. He appears well-developed and well-nourished. No distress.  HENT:  Head: Normocephalic and atraumatic.  Mouth/Throat: Oropharynx is clear and moist.  Significant swelling and TTP to left mandible.   Pt cannot open jaw and clots in mouth under tongue.    Eyes: Conjunctivae and EOM are normal. Pupils are equal, round, and reactive to light.  Neck: Normal range of motion. Neck supple.  No midline C-spine  tenderness to palpation or step-offs appreciated. Patient has full range of motion without pain.   Cardiovascular: Normal rate, regular rhythm and intact distal pulses.   Pulmonary/Chest: Effort normal and breath sounds normal. No stridor. No respiratory distress. He has no wheezes. He has no rales. He exhibits no tenderness.  Abdominal: Soft. Bowel sounds are normal. He exhibits no distension and no mass. There is no tenderness. There is no rebound and no guarding.  Musculoskeletal: Normal range of motion.  Neurological: He is alert and oriented to person, place, and time.  Psychiatric: He has a normal mood and affect.    ED Course  Procedures (including critical care time) Labs Review Labs Reviewed - No data to display  Imaging Review No results found.   EKG Interpretation None      MDM   Final diagnoses:  Open mandibular fracture    Filed Vitals:   06/06/13 0430 06/06/13 0500 06/06/13 0700 06/06/13 0715  BP: 126/56 121/66 127/66 124/70  Pulse: 85 92 77  77  Temp:      TempSrc:      Resp:      SpO2: 92% 94% 93% 92%    Medications  morphine 4 MG/ML injection 4 mg (4 mg Intravenous Given 06/06/13 0014)  ondansetron (ZOFRAN) injection 4 mg (4 mg Intravenous Given 06/06/13 0014)  sodium chloride 0.9 % bolus 1,000 mL (0 mLs Intravenous Stopped 06/06/13 0148)  Tdap (BOOSTRIX) injection 0.5 mL (0.5 mLs Intramuscular Given 06/06/13 0015)  HYDROmorphone (DILAUDID) injection 1 mg (1 mg Intravenous Given 06/06/13 0109)  sodium chloride 0.9 % bolus 1,000  mL (0 mLs Intravenous Stopped 06/06/13 0327)  clindamycin (CLEOCIN) IVPB 600 mg (0 mg Intravenous Stopped 06/06/13 0327)  HYDROmorphone (DILAUDID) injection 1 mg (1 mg Intravenous Given 06/06/13 0516)  dextrose 5 % and 0.45 % NaCl with KCl 20 mEq/L infusion ( Intravenous Stopped 06/06/13 0730)  HYDROmorphone (DILAUDID) injection 1 mg (1 mg Intravenous Given 06/06/13 0720)    Damon Alexander is a 28 y.o. male presenting with severe jaw pain and intraoral bleeding. CT shows a fracture to the body of the mandible and also a molar. OMSF consult from Dr. Chales Salmonwsley appreciated: he recommends that the patient be sent to his office in the a.m. for evaluation and definitive treatment. Agrees with clindamycin antibiotics. As this is only a few hours away and patient is in extreme pain he will be kept in the ED for IV pain medications. Care signed out to attending physician Dr. Lavella LemonsManly at shift change.   Evaluation does not show pathology that would require ongoing emergent intervention or inpatient treatment. Pt is hemodynamically stable and mentating appropriately. Discussed findings and plan with patient/guardian, who agrees with care plan. All questions answered. Return precautions discussed and outpatient follow up given.    Note: Portions of this report may have been transcribed using voice recognition software. Every effort was made to ensure accuracy; however, inadvertent computerized transcription errors may be present     Wynetta Emeryicole Decklyn Hornik, PA-C 06/08/13 1537

## 2013-06-05 NOTE — ED Notes (Signed)
The pt fell hours ago and he has pain and swelling ot the lt side of his face and jaw.  He is spitting up blood and his teeth may be loose.  The pt is not talking .  [facility] [employer] and left there after he had waited for awhile

## 2013-06-05 NOTE — ED Notes (Signed)
Pt's family came to charge nurse desk and hurriedly stated they had [facility] their [facility] and [facility] instead.   Pt signed AMA and left the e.d. Ambulatory without diff.

## 2013-06-06 ENCOUNTER — Emergency Department (HOSPITAL_COMMUNITY): Payer: Self-pay

## 2013-06-06 MED ORDER — SODIUM CHLORIDE 0.9 % IV BOLUS (SEPSIS)
1000.0000 mL | Freq: Once | INTRAVENOUS | Status: AC
  Administered 2013-06-06: 1000 mL via INTRAVENOUS

## 2013-06-06 MED ORDER — HYDROMORPHONE HCL PF 1 MG/ML IJ SOLN
1.0000 mg | INTRAMUSCULAR | Status: AC | PRN
  Administered 2013-06-06 (×3): 1 mg via INTRAVENOUS
  Filled 2013-06-06 (×3): qty 1

## 2013-06-06 MED ORDER — TETANUS-DIPHTH-ACELL PERTUSSIS 5-2.5-18.5 LF-MCG/0.5 IM SUSP
0.5000 mL | Freq: Once | INTRAMUSCULAR | Status: AC
  Administered 2013-06-06: 0.5 mL via INTRAMUSCULAR
  Filled 2013-06-06: qty 0.5

## 2013-06-06 MED ORDER — CLINDAMYCIN PHOSPHATE 600 MG/50ML IV SOLN
600.0000 mg | Freq: Once | INTRAVENOUS | Status: AC
  Administered 2013-06-06: 600 mg via INTRAVENOUS
  Filled 2013-06-06: qty 50

## 2013-06-06 MED ORDER — ONDANSETRON HCL 4 MG/2ML IJ SOLN
4.0000 mg | Freq: Once | INTRAMUSCULAR | Status: AC
  Administered 2013-06-06: 4 mg via INTRAVENOUS
  Filled 2013-06-06: qty 2

## 2013-06-06 MED ORDER — MORPHINE SULFATE 4 MG/ML IJ SOLN
4.0000 mg | Freq: Once | INTRAMUSCULAR | Status: AC
  Administered 2013-06-06: 4 mg via INTRAVENOUS
  Filled 2013-06-06: qty 1

## 2013-06-06 MED ORDER — KCL IN DEXTROSE-NACL 20-5-0.45 MEQ/L-%-% IV SOLN
Freq: Once | INTRAVENOUS | Status: AC
  Administered 2013-06-06: 04:00:00 via INTRAVENOUS
  Filled 2013-06-06: qty 1000

## 2013-06-06 MED ORDER — HYDROMORPHONE HCL PF 1 MG/ML IJ SOLN
1.0000 mg | Freq: Once | INTRAMUSCULAR | Status: AC
  Administered 2013-06-06: 1 mg via INTRAVENOUS
  Filled 2013-06-06: qty 1

## 2013-06-06 NOTE — ED Notes (Signed)
Pt placed on 2 L [place] after med administration. Pt maintaining airway and controlling secretions.

## 2013-06-06 NOTE — ED Notes (Signed)
Pt encouraged to breath in through the nose and out through the mouth.  Pt continues to spit up blood.  Pt given green bag and advised not to spit in to the trash can, room was cleaned up from the blood splattered on the trash can and floor from pt.  Family at bedside.

## 2013-06-06 NOTE — Discharge Instructions (Signed)
Go To Dr. Frederik Pearwsley's Office at 8AM.  Mandibular Fracture  A mandibular fracture is a break in the jawbone. Surgery is often needed to put the jaw back in the right position. Wires may be placed around the teeth to hold the jaw in place while it heals. HOME CARE  Put ice on the injured area.  Put ice in a plastic bag.  Place a towel between your skin and the bag.  Leave the ice on for 15-20 minutes, 03-04 times a day. Do this for the first 2 days.  Only take medicines as told by your doctor.  Eat soft or liquid foods as told by your doctor. Eat plenty of protein.  If your jaws are wired, follow your doctor's directions for wired jaw care.  Sleep on your back to avoid putting pressure on your jaw.  Avoid exercising so hard that you become short of breath. GET HELP RIGHT AWAY IF:  You have a fever.  You have trouble breathing.  You feel like your airway is tight.  You cannot swallow your spit (saliva).  You make a high-pitched whistling sound when you breathe (wheezing).  You have a bad headache or lose feeling in your face (numbness).  You have bad jaw pain that does not get better with medicine.  Your jaw wires become loose.  You feel sick to your stomach (nauseous) or worried (anxious).  Your puffiness (swelling) or redness gets worse. MAKE SURE YOU:  Understand these instructions.  Will watch your condition.  Will get help right away if you are not doing well or get worse. Document Released: 04/14/2011 Document Reviewed: 04/14/2011 [facility] Patient Information 2014 [employer] [place].   Do not hesitate to return to the Emergency Department for any new, worsening or concerning symptoms.

## 2013-06-24 NOTE — ED Provider Notes (Signed)
Medical screening examination/treatment/procedure(s) were performed by non-physician practitioner and as supervising physician I was immediately available for consultation/collaboration.   EKG Interpretation None        Arlington Sigmund, MD 06/24/13 2058 

## 2014-10-18 ENCOUNTER — Emergency Department (HOSPITAL_COMMUNITY)
Admission: EM | Admit: 2014-10-18 | Discharge: 2014-10-19 | Disposition: A | Discharge status: Home / Self Care | Payer: Self-pay | Attending: Emergency Medicine | Admitting: Emergency Medicine

## 2014-10-18 ENCOUNTER — Encounter (HOSPITAL_COMMUNITY): Payer: Self-pay | Admitting: Emergency Medicine

## 2014-10-18 DIAGNOSIS — S00511A Abrasion of lip, initial encounter: Secondary | ICD-10-CM | POA: Insufficient documentation

## 2014-10-18 DIAGNOSIS — Z720 Tobacco use: Secondary | ICD-10-CM | POA: Insufficient documentation

## 2014-10-18 DIAGNOSIS — S93402A Sprain of unspecified ligament of left ankle, initial encounter: Secondary | ICD-10-CM | POA: Insufficient documentation

## 2014-10-18 DIAGNOSIS — Y9389 Activity, other specified: Secondary | ICD-10-CM | POA: Insufficient documentation

## 2014-10-18 DIAGNOSIS — Y998 Other external cause status: Secondary | ICD-10-CM | POA: Insufficient documentation

## 2014-10-18 DIAGNOSIS — Z9119 Patient's noncompliance with other medical treatment and regimen: Secondary | ICD-10-CM | POA: Insufficient documentation

## 2014-10-18 DIAGNOSIS — W010XXA Fall on same level from slipping, tripping and stumbling without subsequent striking against object, initial encounter: Secondary | ICD-10-CM | POA: Insufficient documentation

## 2014-10-18 DIAGNOSIS — Y9289 Other specified places as the place of occurrence of the external cause: Secondary | ICD-10-CM | POA: Insufficient documentation

## 2014-10-18 NOTE — ED Notes (Signed)
Pt c/o left ankle pain after slipping on wet floor.

## 2014-10-19 ENCOUNTER — Emergency Department (HOSPITAL_COMMUNITY): Payer: Self-pay

## 2014-10-19 MED ORDER — NAPROXEN 250 MG PO TABS
500.0000 mg | ORAL_TABLET | Freq: Once | ORAL | Status: AC
  Administered 2014-10-19: 500 mg via ORAL
  Filled 2014-10-19: qty 2

## 2014-10-19 MED ORDER — NAPROXEN 500 MG PO TABS: 500.0000 mg | ORAL_TABLET | Freq: Two times a day (BID) | ORAL | Status: DC

## 2014-10-19 NOTE — Discharge Instructions (Signed)

## 2014-10-19 NOTE — ED Notes (Signed)
Patient verbalizes understanding of discharge instructions, prescription medications, home care and follow up care. Patient ambulatory out of department at this time with mother. 

## 2014-10-19 NOTE — ED Provider Notes (Signed)
CSN: [id number]     Arrival date & time 10/18/14  2350 History   First MD Initiated Contact with Patient 10/19/14 0001     Chief Complaint  Patient presents with  . Ankle Pain     (Consider location/radiation/quality/duration/timing/severity/associated sxs/prior Treatment) HPI   Damon Alexander is a 29 y.o. male who presents to the Emergency Department complaining of sudden onset of left ankle pain after a reported mechanical fall.  He states that he slipped on a wet floor.  Reports pain to lateral ankle with weight bearing.  He has not tried any therapies prior to arrival.  He denies numbness, weakness of the extremity, foot pain, open wound or pain proximal to the ankle.  He denies LOC, head injury, headache, dizziness or pain other than his ankle.     Past Medical History  Diagnosis Date  . Medical history non-contributory    Past Surgical History  Procedure Laterality Date  . No past surgeries    . Tibia im nail insertion Right 07/31/2012    Procedure: INTRAMEDULLARY (IM) NAIL TIBIAL;  Surgeon: Nadara Mustard, MD;  Location: [facility] OR;  Service: Orthopedics;  Laterality: Right;  Intramedullary Nail right tib/fib   History reviewed. No pertinent family history. Social History  Substance Use Topics  . Smoking status: Current Every Day Smoker    Types: Cigarettes  . Smokeless tobacco: None  . Alcohol Use: Yes     Comment: every other day    Review of Systems  Constitutional: Negative for fever and chills.  Musculoskeletal: Positive for joint swelling and arthralgias. Negative for back pain and neck pain.  Skin: Negative for color change and wound.  Neurological: Negative for dizziness, syncope, weakness, numbness and headaches.  All other systems reviewed and are negative.     Allergies  Review of patient's allergies indicates no known allergies.  Home Medications   Prior to Admission medications   Not on File   BP 132/85 mmHg  Pulse 87  Temp(Src) 98.4 F (36.9 C)  (Oral)  Resp 18  Ht  (1.753 m)  Wt 180 lb (81.647 kg)  BMI 26.57 kg/m2  SpO2 95% Physical Exam  Constitutional: He is oriented to person, place, and time. He appears well-developed and well-nourished. No distress.  Pt smells of ETOH  HENT:  Head: Normocephalic and atraumatic.  Mouth/Throat: Uvula is midline, oropharynx is clear and moist and mucous membranes are normal. No oral lesions. No trismus in the jaw. Normal dentition.  Mild focal edema of the right upper lip with 1 cm superifical abraded laceration to oral mucosa, does not extend through the lip.  No active bleeding.  No dental injury or fx.  Airway patent.  No dental avulsion.  No facial tenderness or scalp hematomas  Eyes: EOM are normal. Pupils are equal, round, and reactive to light.  Neck: Normal range of motion. Neck supple.  Cardiovascular: Normal rate, regular rhythm and intact distal pulses.   Pulmonary/Chest: Effort normal and breath sounds normal. No respiratory distress.  Musculoskeletal: He exhibits edema and tenderness.  ttp of the lateral left ankle with mild edema present.  No erythema or ecchymosis.  No tenderness proximal to the ankle.  Achilles NT.  Compartments soft. No spinal tenderness on exam  Neurological: He is alert and oriented to person, place, and time. Coordination normal.  Pt is alert, cooperative and answering questions appropriately.    Skin: Skin is warm and dry.  Psychiatric: He has a normal mood and affect.  Nursing note and vitals reviewed.   ED Course  Procedures (including critical care time) Labs Review Labs Reviewed - No data to display  Imaging Review Dg Ankle Complete Left  10/19/2014   CLINICAL DATA:  29 year old male with fall  EXAM: LEFT ANKLE COMPLETE - 3+ VIEW  COMPARISON:  Radiograph dated 05/30/2013  FINDINGS: There is no evidence of fracture, dislocation, or joint effusion. There is no evidence of arthropathy or other focal bone abnormality. Soft tissues are  unremarkable.  IMPRESSION: Negative.   Electronically Signed   By: Elgie Collard M.D.   On: 10/19/2014 00:54   I have personally reviewed and evaluated these images and lab results as part of my medical decision-making.   EKG Interpretation None      MDM   Final diagnoses:  Sprain of ankle, left, initial encounter  Abrasion of lip, initial encounter   Pt smells of ETOH, but does not appear clinically intoxicated.  Has small, superficial abrasion/lac to the upper lip without further evidence of head injury.  No indication for wound closure.  No cervical spine tenderness.  On recheck he is talking on the phone, speech is clear, arranging for someone to take him home.   Pt with likely sprain of the ankle.  Vitals stable.  XR neg for fx.  He agrees to ASO splint, RICE therapy and close ortho f/u in one week if needed.  Verbalized understanding and agrees to plan.    Upon d/c I was informed by nursing staff that patient is insisting to have crutches.  I have explained to him that crutches are not indicated medically for a sprain. He continues to insist on having crutches stating his pain is too severe for weight bearing.  I have explained that I will dispense crutches to his ride with the understanding he is not to use them tonight.  Pt agrees.      Pauline Aus, PA-C 10/19/14 0139  Shon Baton, MD 10/19/14 [phone]

## 2014-10-19 NOTE — ED Notes (Signed)
Crutches and instructions completed

## 2014-11-26 ENCOUNTER — Emergency Department (HOSPITAL_COMMUNITY)
Admission: EM | Admit: 2014-11-26 | Discharge: 2014-11-26 | Disposition: A | Discharge status: Home / Self Care | Payer: Self-pay | Attending: Emergency Medicine | Admitting: Emergency Medicine

## 2014-11-26 ENCOUNTER — Emergency Department (HOSPITAL_COMMUNITY): Payer: Self-pay

## 2014-11-26 ENCOUNTER — Encounter (HOSPITAL_COMMUNITY): Payer: Self-pay | Admitting: *Deleted

## 2014-11-26 DIAGNOSIS — Z720 Tobacco use: Secondary | ICD-10-CM | POA: Insufficient documentation

## 2014-11-26 DIAGNOSIS — S0990XA Unspecified injury of head, initial encounter: Secondary | ICD-10-CM | POA: Insufficient documentation

## 2014-11-26 DIAGNOSIS — Y998 Other external cause status: Secondary | ICD-10-CM | POA: Insufficient documentation

## 2014-11-26 DIAGNOSIS — W01198A Fall on same level from slipping, tripping and stumbling with subsequent striking against other object, initial encounter: Secondary | ICD-10-CM | POA: Insufficient documentation

## 2014-11-26 DIAGNOSIS — S4992XA Unspecified injury of left shoulder and upper arm, initial encounter: Secondary | ICD-10-CM | POA: Insufficient documentation

## 2014-11-26 DIAGNOSIS — F10120 Alcohol abuse with intoxication, uncomplicated: Secondary | ICD-10-CM | POA: Insufficient documentation

## 2014-11-26 DIAGNOSIS — Y9389 Activity, other specified: Secondary | ICD-10-CM | POA: Insufficient documentation

## 2014-11-26 DIAGNOSIS — Z23 Encounter for immunization: Secondary | ICD-10-CM | POA: Insufficient documentation

## 2014-11-26 DIAGNOSIS — Z791 Long term (current) use of non-steroidal anti-inflammatories (NSAID): Secondary | ICD-10-CM | POA: Insufficient documentation

## 2014-11-26 DIAGNOSIS — Y9289 Other specified places as the place of occurrence of the external cause: Secondary | ICD-10-CM | POA: Insufficient documentation

## 2014-11-26 DIAGNOSIS — F10920 Alcohol use, unspecified with intoxication, uncomplicated: Secondary | ICD-10-CM

## 2014-11-26 LAB — CBC WITH DIFFERENTIAL/PLATELET
BASOS ABS: 0.1 10*3/uL (ref 0.0–0.1)
Basophils Relative: 1 %
EOS ABS: 0.1 10*3/uL (ref 0.0–0.7)
EOS PCT: 1 %
HCT: 37.8 % — ABNORMAL LOW (ref 39.0–52.0)
Hemoglobin: 12.9 g/dL — ABNORMAL LOW (ref 13.0–17.0)
LYMPHS ABS: 2.1 10*3/uL (ref 0.7–4.0)
Lymphocytes Relative: 24 %
MCH: 30.2 pg (ref 26.0–34.0)
MCHC: 34.1 g/dL (ref 30.0–36.0)
MCV: 88.5 fL (ref 78.0–100.0)
Monocytes Absolute: 0.8 10*3/uL (ref 0.1–1.0)
Monocytes Relative: 9 %
NEUTROS ABS: 5.6 10*3/uL (ref 1.7–7.7)
NEUTROS PCT: 65 %
PLATELETS: 249 10*3/uL (ref 150–400)
RBC: 4.27 MIL/uL (ref 4.22–5.81)
RDW: 13.9 % (ref 11.5–15.5)
WBC: 8.6 10*3/uL (ref 4.0–10.5)

## 2014-11-26 LAB — BASIC METABOLIC PANEL
Anion gap: 11 (ref 5–15)
BUN: 9 mg/dL (ref 6–20)
CO2: 25 mmol/L (ref 22–32)
CREATININE: 0.76 mg/dL (ref 0.61–1.24)
Calcium: 8.4 mg/dL — ABNORMAL LOW (ref 8.9–10.3)
Chloride: 105 mmol/L (ref 101–111)
GFR calc Af Amer: >60 mL/min (ref >60)
Glucose, Bld: 96 mg/dL (ref 65–99)
POTASSIUM: 3.2 mmol/L — AB (ref 3.5–5.1)
SODIUM: 141 mmol/L (ref 135–145)

## 2014-11-26 LAB — ETHANOL: ALCOHOL ETHYL (B): 336 mg/dL — AB (ref <5)

## 2014-11-26 MED ORDER — SODIUM CHLORIDE 0.9 % IV SOLN
INTRAVENOUS | Status: AC
  Administered 2014-11-26: 06:00:00 via INTRAVENOUS

## 2014-11-26 MED ORDER — SODIUM CHLORIDE 0.9 % IV BOLUS (SEPSIS)
1000.0000 mL | Freq: Once | INTRAVENOUS | Status: AC
  Administered 2014-11-26: 1000 mL via INTRAVENOUS

## 2014-11-26 MED ORDER — AMMONIA AROMATIC IN INHA
RESPIRATORY_TRACT | Status: AC
  Administered 2014-11-26: 03:00:00
  Filled 2014-11-26: qty 10

## 2014-11-26 MED ORDER — TETANUS-DIPHTH-ACELL PERTUSSIS 5-2.5-18.5 LF-MCG/0.5 IM SUSP
0.5000 mL | Freq: Once | INTRAMUSCULAR | Status: AC
  Administered 2014-11-26: 0.5 mL via INTRAMUSCULAR
  Filled 2014-11-26: qty 0.5

## 2014-11-26 MED ORDER — AMMONIA AROMATIC IN INHA
RESPIRATORY_TRACT | Status: AC
  Administered 2014-11-26: 02:00:00
  Filled 2014-11-26: qty 10

## 2014-11-26 NOTE — ED Notes (Signed)
Pt resting quietly with eyes closed. Responds easily to verbal stimuli. Plan is to keep until sobering up some and reassess, per Dr. Elesa MassedWard. VSS.

## 2014-11-26 NOTE — ED Notes (Signed)
CRITICAL VALUE ALERT  Critical value received:  ETOH 336  Date of notification: 11/26/14 Time of notification:  0300 Critical value read back:Yes.   Nurse who received alert: GMP MD notified (1st page):  DR. Elesa MassedWard Time of first page: 0300 Responding MD: Dr. Elesa MassedWard Time MD responded:  0300

## 2014-11-26 NOTE — ED Notes (Signed)
Pt ambulated to restroom. Requesting d/c.

## 2014-11-26 NOTE — ED Notes (Signed)
Pt states that he tripped and fell hitting his face against the edge of the table, c/o pain to nasal area,

## 2014-11-26 NOTE — ED Provider Notes (Addendum)
TIME SEEN: 2:15 AM  CHIEF COMPLAINT: Fall  HPI: Pt is a 29 y.o. male with no known past medical history who presents emergency department after he reports he tripped and fell hitting his face and left shoulder. Patient reports drinking alcohol the night but is unable to tell me how much. Denies any drug use. Is unclear if he had loss of consciousness. He doesn't appear that he is on anticoagulation. History is very limited as patient is very intoxicated.  ROS: Level V caveat for intoxication  PAST MEDICAL HISTORY/PAST SURGICAL HISTORY:  Past Medical History  Diagnosis Date  . Medical history non-contributory     MEDICATIONS:  Prior to Admission medications   Medication Sig Start Date End Date Taking? Authorizing Provider  naproxen (NAPROSYN) 500 MG tablet Take 1 tablet (500 mg total) by mouth 2 (two) times daily with a meal. 10/19/14   Tammy Triplett, PA-C    ALLERGIES:  No Known Allergies  SOCIAL HISTORY:  Social History  Substance Use Topics  . Smoking status: Current Every Day Smoker    Types: Cigarettes  . Smokeless tobacco: Not on file  . Alcohol Use: Yes     Comment: every other day    FAMILY HISTORY: No family history on file.  EXAM: BP 134/84 mmHg  Pulse 98  Temp(Src) 97.8 F (36.6 C) (Oral)  Resp 20  Ht 5' 9.5" (1.765 m)  Wt 175 lb (79.379 kg)  BMI 25.48 kg/m2  SpO2 100% CONSTITUTIONAL: Alert and oriented to person but falls asleep quickly, arousable with ammonia and painful stimuli and will open eyes, answer questions evidently move all extremities; smells of alcohol HEAD: Normocephalic; small abrasion to the bridge of the nose EYES: Conjunctivae clear, PERRL, EOMI ENT: normal nose; no rhinorrhea; moist mucous membranes; pharynx without lesions noted; no dental injury; no septal hematoma NECK: Supple, no meningismus, no LAD; no midline spinal tenderness, step-off or deformity, small abrasion to the anterior neck with no associated hematoma, trachea is  midline, no stridor CARD: RRR; S1 and S2 appreciated; no murmurs, no clicks, no rubs, no gallops RESP: Normal chest excursion without splinting or tachypnea; breath sounds clear and equal bilaterally; no wheezes, no rhonchi, no rales; no hypoxia or respiratory distress CHEST:  chest wall stable, no crepitus or ecchymosis or deformity, nontender to palpation ABD/GI: Normal bowel sounds; non-distended; soft, non-tender, no rebound, no guarding PELVIS:  stable, nontender to palpation BACK:  The back appears normal and is non-tender to palpation, there is no CVA tenderness; no midline spinal tenderness, step-off or deformity EXT: Tender to palpation of the left anterior shoulder without loss of fullness or bony deformity, full range of motion in this joint passively, otherwise Normal ROM in all joints; otherwise extremities are non-tender to palpation; no edema; normal capillary refill; no cyanosis, no bony tenderness or bony deformity of patient's extremities, no joint effusion, no ecchymosis or lacerations    SKIN: Normal color for age and race; warm, abrasions to the anterior neck and to the anterior left shoulder NEURO: Moves all extremities equally, no obvious facial droop, patient is intoxicated   MEDICAL DECISION MAKING: Patient here intoxicated. Complaining of left shoulder pain and head injury. Has abrasion to the left anterior neck and left shoulder. No other sign of trauma on exam. Will obtain CT of his head, face, cervical spine and x-ray of the left shoulder. We'll continue to monitor patient until he is clinically sober. Labs, urine also pending.  ED PROGRESS: 5:00 AM  Pt's labs  showed alcohol level of 336 drawn at 2:30 AM. CT of his head, face, and cervical spine show no acute injury. X-ray of the left shoulder also normal. Patient is still hemodynamically stable but still intoxicated. Suspect he will need to be in the emergency department for several hours to sober up and be reassessed  prior to dc.     Dajah Fischman N Aribelle Mccosh, DO 11/26/14 0525   8:00 AM  Pt now more arousable but still intoxicated.  Will continue to monitor.  Layla MawKristen N Nikkia Devoss, DO 11/26/14 [phone]

## 2014-11-26 NOTE — ED Provider Notes (Signed)
12:13 PM pt signed out to me at change of shift awaiting him to sober.  He remains sound asleep at this time.  Will try to ambulate patient. 12:55 PM pt is awake, he has tolerated fluid challenge and is requesting discharge.   Jerelyn ScottMartha Linker, MD 11/26/14 1255

## 2014-11-27 ENCOUNTER — Emergency Department (HOSPITAL_COMMUNITY)
Admission: EM | Admit: 2014-11-27 | Discharge: 2014-11-27 | Disposition: A | Discharge status: Home / Self Care | Payer: Self-pay | Attending: Emergency Medicine | Admitting: Emergency Medicine

## 2014-11-27 ENCOUNTER — Encounter (HOSPITAL_COMMUNITY): Payer: Self-pay | Admitting: Emergency Medicine

## 2014-11-27 DIAGNOSIS — Z720 Tobacco use: Secondary | ICD-10-CM | POA: Insufficient documentation

## 2014-11-27 DIAGNOSIS — K047 Periapical abscess without sinus: Secondary | ICD-10-CM | POA: Insufficient documentation

## 2014-11-27 DIAGNOSIS — Z87828 Personal history of other (healed) physical injury and trauma: Secondary | ICD-10-CM | POA: Insufficient documentation

## 2014-11-27 MED ORDER — NAPROXEN 500 MG PO TABS: 500.0000 mg | ORAL_TABLET | Freq: Two times a day (BID) | ORAL | Status: DC

## 2014-11-27 MED ORDER — NAPROXEN 250 MG PO TABS
500.0000 mg | ORAL_TABLET | Freq: Once | ORAL | Status: AC
  Administered 2014-11-27: 500 mg via ORAL
  Filled 2014-11-27: qty 2

## 2014-11-27 MED ORDER — AMOXICILLIN 250 MG PO CAPS
500.0000 mg | ORAL_CAPSULE | Freq: Once | ORAL | Status: AC
  Administered 2014-11-27: 500 mg via ORAL
  Filled 2014-11-27: qty 2

## 2014-11-27 MED ORDER — AMOXICILLIN 500 MG PO CAPS: 500.0000 mg | ORAL_CAPSULE | Freq: Three times a day (TID) | ORAL | Status: DC

## 2014-11-27 NOTE — ED Provider Notes (Signed)
CSN: [id number]     Arrival date & time 11/27/14  1809 History  By signing my name below, I, Damon Alexander, attest that this documentation has been prepared under the direction and in the presence of Burgess Amor, PA-C.  Electronically Signed: Gwenyth Alexander, ED Scribe. 11/27/2014. 7:05 PM.   Chief Complaint  Patient presents with  . Dental Pain   The history is provided by the patient. No language interpreter was used.    HPI Comments: Damon Alexander is a 29 y.o. male who presents to the Emergency Department complaining of constant, moderate, left lower dental pain that started last night. He states HA as an associated symptom. Pt tried Aspirin PTA with no relief. Pt has a history of a jaw fracture 1 year ago which required surgery. He states that he recovered well from surgery and had no complicating factors. Pt was last seen in the ED on 10/23 after he fell and hit his left face and neck. He had CT Head, maxillofacial and cervical spine at that visit which were unremarkable. Pt denies oral swelling, drainage and fever as an associated symptom.   Past Medical History  Diagnosis Date  . Medical history non-contributory    Past Surgical History  Procedure Laterality Date  . No past surgeries    . Tibia im nail insertion Right 07/31/2012    Procedure: INTRAMEDULLARY (IM) NAIL TIBIAL;  Surgeon: Nadara Mustard, MD;  Location: [facility] OR;  Service: Orthopedics;  Laterality: Right;  Intramedullary Nail right tib/fib   History reviewed. No pertinent family history. Social History  Substance Use Topics  . Smoking status: Current Every Day Smoker    Types: Cigarettes  . Smokeless tobacco: None  . Alcohol Use: Yes     Comment: every other day    Review of Systems  Constitutional: Negative for fever.  HENT: Positive for dental problem. Negative for facial swelling and sore throat.   Respiratory: Negative for shortness of breath.   Musculoskeletal: Negative for neck pain and neck stiffness.   Neurological: Positive for headaches.  All other systems reviewed and are negative.  Allergies  Review of patient's allergies indicates no known allergies.  Home Medications   Prior to Admission medications   Medication Sig Start Date End Date Taking? Authorizing Provider  amoxicillin (AMOXIL) 500 MG capsule Take 1 capsule (500 mg total) by mouth 3 (three) times daily. 11/27/14 12/07/14  Burgess Amor, PA-C  naproxen (NAPROSYN) 500 MG tablet Take 1 tablet (500 mg total) by mouth 2 (two) times daily. 11/27/14   Burgess Amor, PA-C   BP 107/73 mmHg  Pulse 77  Temp(Src) 98.7 F (37.1 C) (Oral)  Resp 20  Ht  (1.753 m)  Wt 175 lb (79.379 kg)  BMI 25.83 kg/m2  SpO2 99% Physical Exam  Constitutional: He is oriented to person, place, and time. He appears well-developed and well-nourished. No distress.  HENT:  Head: Normocephalic and atraumatic.  Right Ear: Tympanic membrane and external ear normal.  Left Ear: Tympanic membrane and external ear normal.  Mouth/Throat: Oropharynx is clear and moist and mucous membranes are normal. No oral lesions. No trismus in the jaw. Dental abscesses present.  Eyes: Conjunctivae are normal.  Neck: Normal range of motion. Neck supple.  Cardiovascular: Normal rate and normal heart sounds.   Pulmonary/Chest: Effort normal.  Abdominal: He exhibits no distension.  Musculoskeletal: Normal range of motion.  Lymphadenopathy:    He has no cervical adenopathy.  Neurological: He is alert and oriented to  person, place, and time.  Skin: Skin is warm and dry. No erythema.  Psychiatric: He has a normal mood and affect.  Nursing note and vitals reviewed.   ED Course  Procedures  DIAGNOSTIC STUDIES: Oxygen Saturation is 99% on RA, normal by my interpretation.    COORDINATION OF CARE: 7:06 PM Discussed treatment plan with pt which includes antibiotic treatment and dental resources. Pt agreed to plan.  Labs Review Labs Reviewed - No data to display   EKG  Interpretation None      MDM   Final diagnoses:  Dental infection    Dental infection/probable early abscess.  Pt was placed on amoxil and naproxen.  Dental referrals given.  No trismus, no difficulty swallowing.  The patient appears reasonably screened and/or stabilized for discharge and I doubt any other medical condition or other [facility] requiring further screening, evaluation, or treatment in the ED at this time prior to discharge.   I personally performed the services described in this documentation, which was scribed in my presence. The recorded information has been reviewed and is accurate.   Burgess AmorJulie Ladiamond Gallina, PA-C 11/29/14 1330  Zadie Rhineonald Wickline, MD 11/30/14 [phone]

## 2014-11-27 NOTE — ED Notes (Signed)
Pain to left upper teeth.  Rates pain 9/10.

## 2014-11-27 NOTE — Discharge Instructions (Signed)

## 2014-12-01 ENCOUNTER — Emergency Department (HOSPITAL_COMMUNITY)
Admission: EM | Admit: 2014-12-01 | Discharge: 2014-12-02 | Disposition: A | Discharge status: Home / Self Care | Payer: Self-pay | Attending: Emergency Medicine | Admitting: Emergency Medicine

## 2014-12-01 ENCOUNTER — Encounter (HOSPITAL_COMMUNITY): Payer: Self-pay

## 2014-12-01 DIAGNOSIS — Y92410 Unspecified street and highway as the place of occurrence of the external cause: Secondary | ICD-10-CM | POA: Insufficient documentation

## 2014-12-01 DIAGNOSIS — Z791 Long term (current) use of non-steroidal anti-inflammatories (NSAID): Secondary | ICD-10-CM | POA: Insufficient documentation

## 2014-12-01 DIAGNOSIS — T07XXXA Unspecified multiple injuries, initial encounter: Secondary | ICD-10-CM

## 2014-12-01 DIAGNOSIS — S60511A Abrasion of right hand, initial encounter: Secondary | ICD-10-CM | POA: Insufficient documentation

## 2014-12-01 DIAGNOSIS — Y9389 Activity, other specified: Secondary | ICD-10-CM | POA: Insufficient documentation

## 2014-12-01 DIAGNOSIS — Z720 Tobacco use: Secondary | ICD-10-CM | POA: Insufficient documentation

## 2014-12-01 DIAGNOSIS — S20319A Abrasion of unspecified front wall of thorax, initial encounter: Secondary | ICD-10-CM | POA: Insufficient documentation

## 2014-12-01 DIAGNOSIS — S8991XA Unspecified injury of right lower leg, initial encounter: Secondary | ICD-10-CM | POA: Insufficient documentation

## 2014-12-01 DIAGNOSIS — S60512A Abrasion of left hand, initial encounter: Secondary | ICD-10-CM | POA: Insufficient documentation

## 2014-12-01 DIAGNOSIS — Y998 Other external cause status: Secondary | ICD-10-CM | POA: Insufficient documentation

## 2014-12-01 DIAGNOSIS — T148 Other injury of unspecified body region: Secondary | ICD-10-CM | POA: Insufficient documentation

## 2014-12-01 DIAGNOSIS — F10920 Alcohol use, unspecified with intoxication, uncomplicated: Secondary | ICD-10-CM | POA: Insufficient documentation

## 2014-12-01 DIAGNOSIS — S59901A Unspecified injury of right elbow, initial encounter: Secondary | ICD-10-CM | POA: Insufficient documentation

## 2014-12-01 DIAGNOSIS — S0081XA Abrasion of other part of head, initial encounter: Secondary | ICD-10-CM | POA: Insufficient documentation
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
## 2014-12-02 ENCOUNTER — Emergency Department (HOSPITAL_COMMUNITY): Payer: Self-pay

## 2014-12-02 MED ORDER — IBUPROFEN 800 MG PO TABS
800.0000 mg | ORAL_TABLET | Freq: Once | ORAL | Status: AC
  Administered 2014-12-02: 800 mg via ORAL
  Filled 2014-12-02: qty 1

## 2014-12-02 MED ORDER — IBUPROFEN 800 MG PO TABS: 800.0000 mg | ORAL_TABLET | Freq: Three times a day (TID) | ORAL | Status: DC | PRN
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
## 2014-12-08 ENCOUNTER — Emergency Department (HOSPITAL_COMMUNITY)
Admission: EM | Admit: 2014-12-08 | Discharge: 2014-12-08 | Disposition: A | Discharge status: Home / Self Care | Payer: Self-pay | Attending: Emergency Medicine | Admitting: Emergency Medicine

## 2014-12-08 ENCOUNTER — Encounter (HOSPITAL_COMMUNITY): Payer: Self-pay | Admitting: Emergency Medicine

## 2014-12-08 DIAGNOSIS — R197 Diarrhea, unspecified: Secondary | ICD-10-CM | POA: Insufficient documentation

## 2014-12-08 DIAGNOSIS — K0889 Other specified disorders of teeth and supporting structures: Secondary | ICD-10-CM | POA: Insufficient documentation

## 2014-12-08 DIAGNOSIS — R112 Nausea with vomiting, unspecified: Secondary | ICD-10-CM | POA: Insufficient documentation

## 2014-12-08 DIAGNOSIS — Z720 Tobacco use: Secondary | ICD-10-CM | POA: Insufficient documentation

## 2014-12-08 MED ORDER — IBUPROFEN 400 MG PO TABS
400.0000 mg | ORAL_TABLET | Freq: Once | ORAL | Status: AC
  Administered 2014-12-08: 400 mg via ORAL
  Filled 2014-12-08: qty 1

## 2014-12-08 MED ORDER — AMOXICILLIN 250 MG PO CAPS
1000.0000 mg | ORAL_CAPSULE | Freq: Once | ORAL | Status: AC
  Administered 2014-12-08: 1000 mg via ORAL
  Filled 2014-12-08: qty 4

## 2014-12-08 MED ORDER — AMOXICILLIN 500 MG PO CAPS: 1000.0000 mg | ORAL_CAPSULE | Freq: Two times a day (BID) | ORAL | Status: DC

## 2014-12-08 MED ORDER — OXYCODONE-ACETAMINOPHEN 5-325 MG PO TABS
1.0000 | ORAL_TABLET | Freq: Once | ORAL | Status: AC
  Administered 2014-12-08: 1 via ORAL
  Filled 2014-12-08: qty 1

## 2014-12-08 MED ORDER — LOPERAMIDE HCL 2 MG PO CAPS
4.0000 mg | ORAL_CAPSULE | Freq: Once | ORAL | Status: AC
  Administered 2014-12-08: 4 mg via ORAL
  Filled 2014-12-08: qty 2

## 2014-12-08 MED ORDER — METOCLOPRAMIDE HCL 10 MG PO TABS: 10.0000 mg | ORAL_TABLET | Freq: Four times a day (QID) | ORAL | Status: DC | PRN

## 2014-12-08 MED ORDER — OXYCODONE-ACETAMINOPHEN 5-325 MG PO TABS: 1.0000 | ORAL_TABLET | ORAL | Status: DC | PRN

## 2014-12-08 MED ORDER — ONDANSETRON 8 MG PO TBDP
8.0000 mg | ORAL_TABLET | Freq: Once | ORAL | Status: AC
  Administered 2014-12-08: 8 mg via ORAL
  Filled 2014-12-08: qty 1

## 2014-12-08 NOTE — ED Provider Notes (Signed)
CSN: [id number]     Arrival date & time 12/08/14  1615 History   First MD Initiated Contact with Patient 12/08/14 1647     Chief Complaint  Patient presents with  . Dental Pain  . Diarrhea     (Consider location/radiation/quality/duration/timing/severity/associated sxs/prior Treatment) Patient is a 29 y.o. male presenting with tooth pain and diarrhea. The history is provided by the patient.  Dental Pain Diarrhea He complains of severe pain in the teeth on the left side upper. This started today but is similar to pain he had about 2 weeks ago when he was seen in the emergency department. He has also had nausea and has vomited once. He has had several episodes of diarrhea. He continues have nausea and continues to feel like he may have more diarrhea. He denies fever, chills, sweats. Pain is severe and he rates it at 10/10. Nothing makes it better nothing makes it worse. Of note, he has not seen a dentist since his last visit to the ED.  Past Medical History  Diagnosis Date  . Medical history non-contributory    Past Surgical History  Procedure Laterality Date  . No past surgeries    . Tibia im nail insertion Right 07/31/2012    Procedure: INTRAMEDULLARY (IM) NAIL TIBIAL;  Surgeon: Nadara Mustard, MD;  Location: [facility] OR;  Service: Orthopedics;  Laterality: Right;  Intramedullary Nail right tib/fib   No family history on file. Social History  Substance Use Topics  . Smoking status: Current Every Day Smoker    Types: Cigarettes  . Smokeless tobacco: None  . Alcohol Use: Yes     Comment: every other day    Review of Systems  Gastrointestinal: Positive for diarrhea.  All other systems reviewed and are negative.     Allergies  Review of patient's allergies indicates no known allergies.  Home Medications   Prior to Admission medications   Medication Sig Start Date End Date Taking? Authorizing Provider  ibuprofen (ADVIL,MOTRIN) 800 MG tablet Take 1 tablet (800 mg total) by mouth  every 8 (eight) hours as needed for mild pain. 12/02/14   Kristen N Ward, DO   BP 157/87 mmHg  Pulse 91  Temp(Src) 98.3 F (36.8 C) (Oral)  Resp 20  Ht 5' 9.5" (1.765 m)  Wt 175 lb (79.379 kg)  BMI 25.48 kg/m2  SpO2 100% Physical Exam  Nursing note and vitals reviewed.  29 year old male, who is in obvious pain, but is in no acute distress. Vital signs are significant for hypertension. Oxygen saturation is 100%, which is normal. Head is normocephalic and atraumatic. PERRLA, EOMI. Oropharynx is clear. There are no obvious dental caries and no gingival swelling, erythema, pallor. There is tenderness to percussion over teeth #13 and 14. There is no facial swelling. Neck is nontender and supple without adenopathy or JVD. Back is nontender and there is no CVA tenderness. Lungs are clear without rales, wheezes, or rhonchi. Chest is nontender. Heart has regular rate and rhythm without murmur. Abdomen is soft, flat, nontender without masses or hepatosplenomegaly and peristalsis is normoactive. Extremities have no cyanosis or edema, full range of motion is present. Skin is warm and dry without rash. Neurologic: Mental status is normal, cranial nerves are intact, there are no motor or sensory deficits.  ED Course  Procedures (including critical care time)  MDM   Final diagnoses:  Pain, dental  Nausea vomiting and diarrhea     dental pain of uncertain cause. No obvious dental caries  or dental abscess. Vomiting and diarrhea are likely part of a viral illness. Old records are reviewed confirming recent ED visit for what was diagnosed as a dental infection which history with amoxicillin and patient was advised to obtain follow-up with a dentist. He will be given ondansetron for nausea and loperamide for diarrhea and started on amoxicillin and given a dose of oxycodone-acetaminophen for pain. If he obtains adequate relief from this, will send home with prescriptions for oxycodone-acetaminophen  and amoxicillin and metoclopramide for nausea.  He had good relief of symptoms with above noted treatment. He is discharged with prescriptions for amoxicillin, oxycodone have acetaminophen, and metoclopramide. Advise use over-the-counter loperamide as needed for diarrhea. Urged to arrange for follow-up dental evaluation.  Dione Boozeavid Broden Holt, MD 12/08/14 [id number] [id number]

## 2014-12-08 NOTE — ED Notes (Signed)
Pt c/o of LT sided dental pain that began today. Pt reports 3 episodes of diarrhea and 1 episode of vomiting in past 24 hours. Airway patent.

## 2014-12-08 NOTE — Discharge Instructions (Signed)
He need to see a dentist to evaluate what is causing this pain. At the end of these instructions will be dental resources for this region.  Take loperamide (Imodium AD) as needed for diarrhea.  Dental Pain Dental pain may be caused by many things, including:  Tooth decay (cavities or caries). Cavities expose the nerve of your tooth to air and hot or cold temperatures. This can cause pain or discomfort.  Abscess or infection. A dental abscess is a collection of infected pus from a bacterial infection in the inner part of the tooth (pulp). It usually occurs at the end of the tooth's root.  Injury.  An unknown reason (idiopathic). Your pain may be mild or severe. It may only occur when:  You are chewing.  You are exposed to hot or cold temperature.  You are eating or drinking sugary foods or beverages, such as soda or candy. Your pain may also be constant. HOME CARE INSTRUCTIONS Watch your dental pain for any changes. The following actions may help to lessen any discomfort that you are feeling:  Take medicines only as directed by your dentist.  If you were prescribed an antibiotic medicine, finish all of it even if you start to feel better.  Keep all follow-up visits as directed by your dentist. This is important.  Do not apply heat to the outside of your face.  Rinse your mouth or gargle with salt water if directed by your dentist. This helps with pain and swelling.  You can make salt water by adding  tsp of salt to 1 cup of warm water.  Apply ice to the painful area of your face:  Put ice in a plastic bag.  Place a towel between your skin and the bag.  Leave the ice on for 20 minutes, 2-3 times per day.  Avoid foods or drinks that cause you pain, such as:  Very hot or very cold foods or drinks.  Sweet or sugary foods or drinks. SEEK MEDICAL CARE IF:  Your pain is not controlled with medicines.  Your symptoms are worse.  You have new symptoms. SEEK IMMEDIATE  MEDICAL CARE IF:  You are unable to open your mouth.  You are having trouble breathing or swallowing.  You have a fever.  Your face, neck, or jaw is swollen.   This information is not intended to replace advice given to you by your health care provider. Make sure you discuss any questions you have with your health care provider.   Document Released: 01/20/2005 Document Revised: 06/06/2014 Document Reviewed: 01/16/2014 Elsevier Interactive Patient Education 2016 [employer]  Nausea and Vomiting Nausea is a sick feeling that often comes before throwing up (vomiting). Vomiting is a reflex where stomach contents come out of your mouth. Vomiting can cause severe loss of body fluids (dehydration). Children and elderly adults can become dehydrated quickly, especially if they also have diarrhea. Nausea and vomiting are symptoms of a condition or disease. It is important to find the cause of your symptoms. CAUSES   Direct irritation of the stomach lining. This irritation can result from increased acid production (gastroesophageal reflux disease), infection, food poisoning, taking certain medicines (such as nonsteroidal anti-inflammatory drugs), alcohol use, or tobacco use.  Signals from the brain.These signals could be caused by a headache, heat exposure, an inner ear disturbance, increased pressure in the brain from injury, infection, a tumor, or a concussion, pain, emotional stimulus, or metabolic problems.  An obstruction in the gastrointestinal tract (bowel obstruction).  Illnesses such as diabetes, hepatitis, gallbladder problems, appendicitis, kidney problems, cancer, sepsis, atypical symptoms of a heart attack, or eating disorders.  Medical treatments such as chemotherapy and radiation.  Receiving medicine that makes you sleep (general anesthetic) during surgery. DIAGNOSIS Your caregiver may ask for tests to be done if the problems do not improve after a few days. Tests may also be  done if symptoms are severe or if the reason for the nausea and vomiting is not clear. Tests may include:  Urine tests.  Blood tests.  Stool tests.  Cultures (to look for evidence of infection).  X-rays or other imaging studies. Test results can help your caregiver make decisions about treatment or the need for additional tests. TREATMENT You need to stay well hydrated. Drink frequently but in small amounts.You may wish to drink water, sports drinks, clear broth, or eat frozen ice pops or gelatin dessert to help stay hydrated.When you eat, eating slowly may help prevent nausea.There are also some antinausea medicines that may help prevent nausea. HOME CARE INSTRUCTIONS   Take all medicine as directed by your caregiver.  If you do not have an appetite, do not force yourself to eat. However, you must continue to drink fluids.  If you have an appetite, eat a normal diet unless your caregiver tells you differently.  Eat a variety of complex carbohydrates (rice, wheat, potatoes, bread), lean meats, yogurt, fruits, and vegetables.  Avoid high-fat foods because they are more difficult to digest.  Drink enough water and fluids to keep your urine clear or pale yellow.  If you are dehydrated, ask your caregiver for specific rehydration instructions. Signs of dehydration may include:  Severe thirst.  Dry lips and mouth.  Dizziness.  Dark urine.  Decreasing urine frequency and amount.  Confusion.  Rapid breathing or pulse. SEEK IMMEDIATE MEDICAL CARE IF:   You have blood or brown flecks (like coffee grounds) in your vomit.  You have black or bloody stools.  You have a severe headache or stiff neck.  You are confused.  You have severe abdominal pain.  You have chest pain or trouble breathing.  You do not urinate at least once every 8 hours.  You develop cold or clammy skin.  You continue to vomit for longer than 24 to 48 hours.  You have a fever. MAKE SURE YOU:     Understand these instructions.  Will watch your condition.  Will get help right away if you are not doing well or get worse.   This information is not intended to replace advice given to you by your health care provider. Make sure you discuss any questions you have with your health care provider.   Document Released: 01/20/2005 Document Revised: 04/14/2011 Document Reviewed: 06/19/2010 Elsevier Interactive Patient Education 2016 [employer].  Diarrhea Diarrhea is frequent loose and watery bowel movements. It can cause you to feel weak and dehydrated. Dehydration can cause you to become tired and thirsty, have a dry mouth, and have decreased urination that often is dark yellow. Diarrhea is a sign of another problem, most often an infection that will not last long. In most cases, diarrhea typically lasts 2-3 days. However, it can last longer if it is a sign of something more serious. It is important to treat your diarrhea as directed by your caregiver to lessen or prevent future episodes of diarrhea. CAUSES  Some common causes include:  Gastrointestinal infections caused by viruses, bacteria, or parasites.  Food poisoning or food allergies.  Certain  medicines, such as antibiotics, chemotherapy, and laxatives.  Artificial sweeteners and fructose.  Digestive disorders. HOME CARE INSTRUCTIONS  Ensure adequate fluid intake (hydration): Have 1 cup (8 oz) of fluid for each diarrhea episode. Avoid fluids that contain simple sugars or sports drinks, fruit juices, whole milk products, and sodas. Your urine should be clear or pale yellow if you are drinking enough fluids. Hydrate with an oral rehydration solution that you can purchase at pharmacies, retail stores, and online. You can prepare an oral rehydration solution at home by mixing the following ingredients together:   - tsp table salt.   tsp baking soda.   tsp salt substitute containing potassium chloride.  1  tablespoons  sugar.  1 L (34 oz) of water.  Certain foods and beverages may increase the speed at which food moves through the gastrointestinal (GI) tract. These foods and beverages should be avoided and include:  Caffeinated and alcoholic beverages.  High-fiber foods, such as raw fruits and vegetables, nuts, seeds, and whole grain breads and cereals.  Foods and beverages sweetened with sugar alcohols, such as xylitol, sorbitol, and mannitol.  Some foods may be well tolerated and may help thicken stool including:  Starchy foods, such as rice, toast, pasta, low-sugar cereal, oatmeal, grits, baked potatoes, crackers, and bagels.  Bananas.  Applesauce.  Add probiotic-rich foods to help increase healthy bacteria in the GI tract, such as yogurt and fermented milk products.  Wash your hands well after each diarrhea episode.  Only take over-the-counter or prescription medicines as directed by your caregiver.  Take a warm bath to relieve any burning or pain from frequent diarrhea episodes. SEEK IMMEDIATE MEDICAL CARE IF:   You are unable to keep fluids down.  You have persistent vomiting.  You have blood in your stool, or your stools are black and tarry.  You do not urinate in 6-8 hours, or there is only a small amount of very dark urine.  You have abdominal pain that increases or localizes.  You have weakness, dizziness, confusion, or light-headedness.  You have a severe headache.  Your diarrhea gets worse or does not get better.  You have a fever or persistent symptoms for more than 2-3 days.  You have a fever and your symptoms suddenly get worse. MAKE SURE YOU:   Understand these instructions.  Will watch your condition.  Will get help right away if you are not doing well or get worse.   This information is not intended to replace advice given to you by your health care provider. Make sure you discuss any questions you have with your health care provider.   Document Released:  01/10/2002 Document Revised: 02/10/2014 Document Reviewed: 09/28/2011 Elsevier Interactive Patient Education 2016 Else[employer] Inc.  Amoxicillin capsules or tablets What is this medicine? AMOXICILLIN (a mox i SIL in) is a penicillin antibiotic. It is used to treat certain kinds of bacterial infections. It will not work for colds, flu, or other viral infections. This medicine may be used for other purposes; ask your health care provider or pharmacist if you have questions. What should I tell my health care provider before I take this medicine? They need to know if you have any of these conditions: -asthma -kidney disease -an unusual or allergic reaction to amoxicillin, other penicillins, cephalosporin antibiotics, other medicines, foods, dyes, or preservatives -pregnant or trying to get pregnant -breast-feeding How should I use this medicine? Take this medicine by mouth with a glass of water. Follow the directions on your prescription  label. You may take this medicine with food or on an empty stomach. Take your medicine at regular intervals. Do not take your medicine more often than directed. Take all of your medicine as directed even if you think your are better. Do not skip doses or stop your medicine early. Talk to your pediatrician regarding the use of this medicine in children. While this drug may be prescribed for selected conditions, precautions do apply. Overdosage: If you think you have taken too much of this medicine contact a poison control center or emergency room at once. NOTE: This medicine is only for you. Do not share this medicine with others. What if I miss a dose? If you miss a dose, take it as soon as you can. If it is almost time for your next dose, take only that dose. Do not take double or extra doses. What may interact with this medicine? -amiloride -birth control pills -chloramphenicol -macrolides -probenecid -sulfonamides -tetracyclines This list may not describe  all possible interactions. Give your health care provider a list of all the medicines, herbs, non-prescription drugs, or dietary supplements you use. Also tell them if you smoke, drink alcohol, or use illegal drugs. Some items may interact with your medicine. What should I watch for while using this medicine? Tell your doctor or health care professional if your symptoms do not improve in 2 or 3 days. Take all of the doses of your medicine as directed. Do not skip doses or stop your medicine early. If you are diabetic, you may get a false positive result for sugar in your urine with certain brands of urine tests. Check with your doctor. Do not treat diarrhea with over-the-counter products. Contact your doctor if you have diarrhea that lasts more than 2 days or if the diarrhea is severe and watery. What side effects may I notice from receiving this medicine? Side effects that you should report to your doctor or health care professional as soon as possible: -allergic reactions like skin rash, itching or hives, swelling of the face, lips, or tongue -breathing problems -dark urine -redness, blistering, peeling or loosening of the skin, including inside the mouth -seizures -severe or watery diarrhea -trouble passing urine or change in the amount of urine -unusual bleeding or bruising -unusually weak or tired -yellowing of the eyes or skin Side effects that usually do not require medical attention (report to your doctor or health care professional if they continue or are bothersome): -dizziness -headache -stomach upset -trouble sleeping This list may not describe all possible side effects. Call your doctor for medical advice about side effects. You may report side effects to FDA at 1-800-FDA-1088. Where should I keep my medicine? Keep out of the reach of children. Store between 68 and 77 degrees F (20 and 25 degrees C). Keep bottle closed tightly. Throw away any unused medicine after the expiration  date. NOTE: This sheet is a summary. It may not cover all possible information. If you have questions about this medicine, talk to your doctor, pharmacist, or health care provider.    2016, Elsevier/Gold Standard. (2007-04-13 14:10:59)  Acetaminophen; Oxycodone tablets What is this medicine? ACETAMINOPHEN; OXYCODONE (a set a MEE noe fen; ox i KOE done) is a pain reliever. It is used to treat moderate to severe pain. This medicine may be used for other purposes; ask your health care provider or pharmacist if you have questions. What should I tell my health care provider before I take this medicine? They need to know if  you have any of these conditions: -brain tumor -Crohn's disease, inflammatory bowel disease, or ulcerative colitis -drug abuse or addiction -head injury -heart or circulation problems -if you often drink alcohol -kidney disease or problems going to the bathroom -liver disease -lung disease, asthma, or breathing problems -an unusual or allergic reaction to acetaminophen, oxycodone, other opioid analgesics, other medicines, foods, dyes, or preservatives -pregnant or trying to get pregnant -breast-feeding How should I use this medicine? Take this medicine by mouth with a full glass of water. Follow the directions on the prescription label. You can take it with or without food. If it upsets your stomach, take it with food. Take your medicine at regular intervals. Do not take it more often than directed. Talk to your pediatrician regarding the use of this medicine in children. Special care may be needed. Patients over 83 years old may have a stronger reaction and need a smaller dose. Overdosage: If you think you have taken too much of this medicine contact a poison control center or emergency room at once. NOTE: This medicine is only for you. Do not share this medicine with others. What if I miss a dose? If you miss a dose, take it as soon as you can. If it is almost time for  your next dose, take only that dose. Do not take double or extra doses. What may interact with this medicine? -alcohol -antihistamines -barbiturates like amobarbital, butalbital, butabarbital, methohexital, pentobarbital, phenobarbital, thiopental, and secobarbital -benztropine -drugs for bladder problems like solifenacin, trospium, oxybutynin, tolterodine, hyoscyamine, and methscopolamine -drugs for breathing problems like ipratropium and tiotropium -drugs for certain stomach or intestine problems like propantheline, homatropine methylbromide, glycopyrrolate, atropine, belladonna, and dicyclomine -general anesthetics like etomidate, ketamine, nitrous oxide, propofol, desflurane, enflurane, halothane, isoflurane, and sevoflurane -medicines for depression, anxiety, or psychotic disturbances -medicines for sleep -muscle relaxants -naltrexone -narcotic medicines (opiates) for pain -phenothiazines like perphenazine, thioridazine, chlorpromazine, mesoridazine, fluphenazine, prochlorperazine, promazine, and trifluoperazine -scopolamine -tramadol -trihexyphenidyl This list may not describe all possible interactions. Give your health care provider a list of all the medicines, herbs, non-prescription drugs, or dietary supplements you use. Also tell them if you smoke, drink alcohol, or use illegal drugs. Some items may interact with your medicine. What should I watch for while using this medicine? Tell your doctor or health care professional if your pain does not go away, if it gets worse, or if you have new or a different type of pain. You may develop tolerance to the medicine. Tolerance means that you will need a higher dose of the medication for pain relief. Tolerance is normal and is expected if you take this medicine for a long time. Do not suddenly stop taking your medicine because you may develop a severe reaction. Your body becomes used to the medicine. This does NOT mean you are addicted.  Addiction is a behavior related to getting and using a drug for a non-medical reason. If you have pain, you have a medical reason to take pain medicine. Your doctor will tell you how much medicine to take. If your doctor wants you to stop the medicine, the dose will be slowly lowered over time to avoid any side effects. You may get drowsy or dizzy. Do not drive, use machinery, or do anything that needs mental alertness until you know how this medicine affects you. Do not stand or sit up quickly, especially if you are an older patient. This reduces the risk of dizzy or fainting spells. Alcohol may interfere with the effect  of this medicine. Avoid alcoholic drinks. There are different types of narcotic medicines (opiates) for pain. If you take more than one type at the same time, you may have more side effects. Give your health care provider a list of all medicines you use. Your doctor will tell you how much medicine to take. Do not take more medicine than directed. Call emergency for help if you have problems breathing. The medicine will cause constipation. Try to have a bowel movement at least every 2 to 3 days. If you do not have a bowel movement for 3 days, call your doctor or health care professional. Do not take Tylenol (acetaminophen) or medicines that have acetaminophen with this medicine. Too much acetaminophen can be very dangerous. Many nonprescription medicines contain acetaminophen. Always read the labels carefully to avoid taking more acetaminophen. What side effects may I notice from receiving this medicine? Side effects that you should report to your doctor or health care professional as soon as possible: -allergic reactions like skin rash, itching or hives, swelling of the face, lips, or tongue -breathing difficulties, wheezing -confusion -light headedness or fainting spells -severe stomach pain -unusually weak or tired -yellowing of the skin or the whites of the eyes Side effects that  usually do not require medical attention (report to your doctor or health care professional if they continue or are bothersome): -dizziness -drowsiness -nausea -vomiting This list may not describe all possible side effects. Call your doctor for medical advice about side effects. You may report side effects to FDA at 1-800-FDA-1088. Where should I keep my medicine? Keep out of the reach of children. This medicine can be abused. Keep your medicine in a safe place to protect it from theft. Do not share this medicine with anyone. Selling or giving away this medicine is dangerous and against the law. This medicine may cause accidental overdose and death if it taken by other adults, children, or pets. Mix any unused medicine with a substance like cat litter or coffee grounds. Then throw the medicine away in a sealed container like a sealed bag or a coffee can with a lid. Do not use the medicine after the expiration date. Store at room temperature between 20 and 25 degrees C (68 and 77 degrees F). NOTE: This sheet is a summary. It may not cover all possible information. If you have questions about this medicine, talk to your doctor, pharmacist, or health care provider.    2016, Elsevier/Gold Standard. (2013-12-21 15:18:46)  Metoclopramide tablets What is this medicine? METOCLOPRAMIDE (met oh kloe PRA mide) is used to treat the symptoms of gastroesophageal reflux disease (GERD) like heartburn. It is also used to treat people with slow emptying of the stomach and intestinal tract. This medicine may be used for other purposes; ask your health care provider or pharmacist if you have questions. What should I tell my health care provider before I take this medicine? They need to know if you have any of these conditions: -breast cancer -depression -diabetes -heart failure -high blood pressure -kidney disease -liver disease -Parkinson's disease or a movement  disorder -pheochromocytoma -seizures -stomach obstruction, bleeding, or perforation -an unusual or allergic reaction to metoclopramide, procainamide, sulfites, other medicines, foods, dyes, or preservatives -pregnant or trying to get pregnant -breast-feeding How should I use this medicine? Take this medicine by mouth with a glass of water. Follow the directions on the prescription label. Take this medicine on an empty stomach, about 30 minutes before eating. Take your doses at regular intervals.  Do not take your medicine more often than directed. Do not stop taking except on the advice of your doctor or health care professional. A special MedGuide will be given to you by the pharmacist with each prescription and refill. Be sure to read this information carefully each time. Talk to your pediatrician regarding the use of this medicine in children. Special care may be needed. Overdosage: If you think you have taken too much of this medicine contact a poison control center or emergency room at once. NOTE: This medicine is only for you. Do not share this medicine with others. What if I miss a dose? If you miss a dose, take it as soon as you can. If it is almost time for your next dose, take only that dose. Do not take double or extra doses. What may interact with this medicine? -acetaminophen -cyclosporine -digoxin -medicines for blood pressure -medicines for diabetes, including insulin -medicines for hay fever and other allergies -medicines for depression, especially an Monoamine Oxidase Inhibitor (MAOI) -medicines for Parkinson's disease, like levodopa -medicines for sleep or for pain -tetracycline This list may not describe all possible interactions. Give your health care provider a list of all the medicines, herbs, non-prescription drugs, or dietary supplements you use. Also tell them if you smoke, drink alcohol, or use illegal drugs. Some items may interact with your medicine. What should  I watch for while using this medicine? It may take a few weeks for your stomach condition to start to get better. However, do not take this medicine for longer than 12 weeks. The longer you take this medicine, and the more you take it, the greater your chances are of developing serious side effects. If you are an elderly patient, a male patient, or you have diabetes, you may be at an increased risk for side effects from this medicine. Contact your doctor immediately if you start having movements you cannot control such as lip smacking, rapid movements of the tongue, involuntary or uncontrollable movements of the eyes, head, arms and legs, or muscle twitches and spasms. Patients and their families should watch out for worsening depression or thoughts of suicide. Also watch out for any sudden or severe changes in feelings such as feeling anxious, agitated, panicky, irritable, hostile, aggressive, impulsive, severely restless, overly excited and hyperactive, or not being able to sleep. If this happens, especially at the beginning of treatment or after a change in dose, call your doctor. Do not treat yourself for high fever. Ask your doctor or health care professional for advice. You may get drowsy or dizzy. Do not drive, use machinery, or do anything that needs mental alertness until you know how this drug affects you. Do not stand or sit up quickly, especially if you are an older patient. This reduces the risk of dizzy or fainting spells. Alcohol can make you more drowsy and dizzy. Avoid alcoholic drinks. What side effects may I notice from receiving this medicine? Side effects that you should report to your doctor or health care professional as soon as possible: -allergic reactions like skin rash, itching or hives, swelling of the face, lips, or tongue -abnormal production of milk in females -breast enlargement in both males and females -change in the way you walk -difficulty moving, speaking or  swallowing -drooling, lip smacking, or rapid movements of the tongue -excessive sweating -fever -involuntary or uncontrollable movements of the eyes, head, arms and legs -irregular heartbeat or palpitations -muscle twitches and spasms -unusually weak or tired Side effects that  usually do not require medical attention (report to your doctor or health care professional if they continue or are bothersome): -change in sex drive or performance -depressed mood -diarrhea -difficulty sleeping -headache -menstrual changes -restless or nervous This list may not describe all possible side effects. Call your doctor for medical advice about side effects. You may report side effects to FDA at 1-800-FDA-1088. Where should I keep my medicine? Keep out of the reach of children. Store at room temperature between 20 and 25 degrees C (68 and 77 degrees F). Protect from light. Keep container tightly closed. Throw away any unused medicine after the expiration date. NOTE: This sheet is a summary. It may not cover all possible information. If you have questions about this medicine, talk to your doctor, pharmacist, or health care provider.    2016, Elsevier/Gold Standard. (2011-05-20 13:04:38)   Emergency Department Resource Guide 1) Find a Doctor and Pay Out of Pocket Although you won't have to find out who is covered by your insurance plan, it is a good idea to ask around and get recommendations. You will then need to call the office and see if the doctor you have chosen will accept you as a new patient and what types of options they offer for patients who are self-pay. Some doctors offer discounts or will set up payment plans for their patients who do not have insurance, but you will need to ask so you aren't surprised when you get to your appointment.  2) Contact Your Local Health Department Not all health departments have doctors that can see patients for sick visits, but many do, so it is worth a call to  see if yours does. If you don't know where your local health department is, you can check in your phone book. The CDC also has a tool to help you locate your state's health department, and many state websites also have listings of all of their local health departments.  3) Find a [facility] If your illness is not likely to be very severe or complicated, you may want to try a walk in clinic. These are popping up all over the country in pharmacies, drugstores, and shopping centers. They're usually staffed by nurse practitioners or physician assistants that have been trained to treat common illnesses and complaints. They're usually fairly quick and inexpensive. However, if you have serious medical issues or chronic medical problems, these are probably not your best option.  No Primary Care Doctor: - Call Health Connect at  [phone] - they can help you locate a primary care doctor that  accepts your insurance, provides certain services, etc. - Physician Referral Service- [phone]  Chronic Pain Problems: Organization         Address  Phone   Notes  [facility]  [phone] Patients need to be referred by their primary care doctor.   Medication Assistance: Organization         Address  Phone   Notes  [facility] Medication [facility] [address] [place], [place] [address] [phone] --Must be a resident of [facility] -- Must have NO insurance coverage whatsoever (no Medicaid/ Medicare, etc.) -- The pt. MUST have a primary care doctor that directs their care regularly and follows them in the community   [facility]  [phone]   [employer]  [phone]    Agencies that provide inexpensive medical care: Patent attorney  Notes  [place]  [phone]   [facility]    [phone]   [facility] [place], [place] [address] [phone]   [place] [address] [place] [phone]   [facility]    [phone]   [facility]    [phone]   [place] and [place] [address], [place] Phone:  [phone], Fax:  [phone] Hours of Operation:  9 am - 6 pm, M-F.  Also accepts Medicaid/Medicare and self-pay.  [facility] for [place], [address], [place] Phone: [phone], Fax: [phone]. Hours of Operation:  8:30 am - 5:30 pm, M-F.  Also accepts Medicaid and self-pay.  [facility] [address], [place] Phone: [phone]   [place], [place], [place] [phone], Ext. [phone] Mondays & Thursdays: 7-9 AM.  First 15 patients are seen on a first come, first serve basis.    [place] Providers:  Organization         Address  Phone   Notes  [facility] [address], [place] [phone] Also accepts self-pay patients.  [facility] [address], [place]  [phone]   [place], [address], [place] [phone]   [facility] [address], [place] [phone]   Lucianne Lei [address], [place]   [phone] Only accepts [place] Access [place] patients after they have their name applied to their card.   Self-Pay (no insurance) in [facility]:  Organization         Address  Phone   Notes  Sickle Cell Patients, [facility] [place] [phone]   [facility] [place] [phone]   [facility] [place]  [place], [place], [place] [phone]   [facility]/Dr. Osei-Bonsu  [address], [place] or [address], [place] [phone] Phone number for both [place] and [place] locations is the same.  [facility] and [facility] [address] [place] [phone]   [facility] [address] [place] or [address] [phone]   [facility] [address], [place] [phone], phone; [phone], fax Sees patients 1st and 3rd Saturday of every month.  Must not qualify for public or private insurance (i.e. Medicaid, Medicare, [place] Health Choice, Veterans' Benefits)  Household income should be no more than 200% of the poverty level The clinic cannot treat you if you are pregnant or think you are pregnant  Sexually transmitted diseases are not treated at the clinic.    Dental Care: Organization         Address  Phone  Notes  [facility] Department of [facility] [place] [phone] Accepts children up to age 104 who are enrolled in [place] or [place]; pregnant women with a Medicaid card; and children who have applied for Medicaid or [place] Health Choice, but were declined, whose parents can pay a reduced fee at time of service.  [facility]  Department of [facility]  [address], [place] [phone] Accepts children up to age 57 who are enrolled in [place] or [place]; pregnant women with a Medicaid card; and children who have applied for Medicaid or [place] Health Choice, but were declined, whose parents can pay a reduced fee at time of service.  [place]  [place] [phone] Patients are seen by appointment only. Walk-ins are not accepted. [place] will see patients 41 years of age and older. Monday - Tuesday (8am-5pm) Most Wednesdays (8:30-5pm) $30 per visit, cash only  [facility] Adult Dental Access PROGRAM  [address], [facility] [phone] Patients are  seen by appointment only. Walk-ins are not accepted. [place] will see patients 63 years of age and older. One Wednesday Evening (Monthly: Volunteer Based).  $30 per visit, cash only  [place]  [phone] for adults; Children under age 81, call Graduate Pediatric Dentistry at [phone]. Children aged 34-14, please call [phone] to request a pediatric application.  Dental services are provided in all areas of dental care including fillings, crowns and bridges, complete and partial dentures, implants, gum treatment, root canals, and extractions. Preventive care is also provided. Treatment is provided to both adults and children. Patients are selected via a lottery and there is often a waiting list.   [facility] [address], [place]  [phone] [email or web address]   [facility] [address] [place], [place] [phone], Ext. [phone] Second and Fourth Thursday of each month, opens at 6:30 AM; Clinic ends at 9 AM.  Patients are seen on a first-come first-served basis, and a limited number are seen during each clinic.   [facility]  [address] [place], [place] [phone]   Eligibility Requirements You must have lived in [place], [place], or [place] counties for at least the last three months.   You cannot be eligible for state or federal sponsored [employer], including [employer], [place], or [employer].   You generally cannot be eligible for healthcare insurance through your employer.    How to apply: Eligibility screenings are held every Tuesday and Wednesday afternoon from 1:00 pm until 4:00 pm. You do not need an appointment for the interview!  [facility] [address], [place], [place]   [place]  [place]  [phone]

## 2014-12-08 NOTE — ED Notes (Signed)
Pt made aware to return if symptoms worsen or if any life threatening symptoms occur.   

## 2015-10-05 ENCOUNTER — Emergency Department (HOSPITAL_COMMUNITY): Payer: Self-pay | Admitting: Certified Registered Nurse Anesthetist

## 2015-10-05 ENCOUNTER — Encounter (HOSPITAL_COMMUNITY): Payer: Self-pay | Admitting: Emergency Medicine

## 2015-10-05 ENCOUNTER — Inpatient Hospital Stay (HOSPITAL_COMMUNITY)
Admission: EM | Admit: 2015-10-05 | Discharge: 2015-10-12 | DRG: 492 | Disposition: A | Discharge status: Home / Self Care | Payer: Self-pay | Attending: Orthopedic Surgery | Admitting: Orthopedic Surgery

## 2015-10-05 ENCOUNTER — Emergency Department (HOSPITAL_COMMUNITY): Payer: Self-pay

## 2015-10-05 ENCOUNTER — Encounter (HOSPITAL_COMMUNITY)
Admission: EM | Disposition: A | Discharge status: Home / Self Care | Payer: Self-pay | Source: Home / Self Care | Attending: Orthopedic Surgery

## 2015-10-05 DIAGNOSIS — F1010 Alcohol abuse, uncomplicated: Secondary | ICD-10-CM | POA: Diagnosis present

## 2015-10-05 DIAGNOSIS — F17210 Nicotine dependence, cigarettes, uncomplicated: Secondary | ICD-10-CM | POA: Diagnosis present

## 2015-10-05 DIAGNOSIS — S98912A Complete traumatic amputation of left foot, level unspecified, initial encounter: Secondary | ICD-10-CM

## 2015-10-05 DIAGNOSIS — I10 Essential (primary) hypertension: Secondary | ICD-10-CM | POA: Diagnosis present

## 2015-10-05 DIAGNOSIS — S82209A Unspecified fracture of shaft of unspecified tibia, initial encounter for closed fracture: Secondary | ICD-10-CM | POA: Diagnosis present

## 2015-10-05 DIAGNOSIS — R443 Hallucinations, unspecified: Secondary | ICD-10-CM | POA: Diagnosis not present

## 2015-10-05 DIAGNOSIS — D62 Acute posthemorrhagic anemia: Secondary | ICD-10-CM | POA: Diagnosis not present

## 2015-10-05 DIAGNOSIS — T148XXA Other injury of unspecified body region, initial encounter: Secondary | ICD-10-CM

## 2015-10-05 DIAGNOSIS — S82872C Displaced pilon fracture of left tibia, initial encounter for open fracture type IIIA, IIIB, or IIIC: Secondary | ICD-10-CM

## 2015-10-05 DIAGNOSIS — F17200 Nicotine dependence, unspecified, uncomplicated: Secondary | ICD-10-CM | POA: Diagnosis present

## 2015-10-05 DIAGNOSIS — S8252XB Displaced fracture of medial malleolus of left tibia, initial encounter for open fracture type I or II: Principal | ICD-10-CM | POA: Diagnosis present

## 2015-10-05 DIAGNOSIS — S82832B Other fracture of upper and lower end of left fibula, initial encounter for open fracture type I or II: Secondary | ICD-10-CM | POA: Diagnosis present

## 2015-10-05 DIAGNOSIS — Y908 Blood alcohol level of 240 mg/100 ml or more: Secondary | ICD-10-CM | POA: Diagnosis present

## 2015-10-05 DIAGNOSIS — S82832C Other fracture of upper and lower end of left fibula, initial encounter for open fracture type IIIA, IIIB, or IIIC: Secondary | ICD-10-CM | POA: Diagnosis present

## 2015-10-05 DIAGNOSIS — S82409A Unspecified fracture of shaft of unspecified fibula, initial encounter for closed fracture: Secondary | ICD-10-CM

## 2015-10-05 DIAGNOSIS — S82309B Unspecified fracture of lower end of unspecified tibia, initial encounter for open fracture type I or II: Secondary | ICD-10-CM

## 2015-10-05 DIAGNOSIS — F10231 Alcohol dependence with withdrawal delirium: Secondary | ICD-10-CM | POA: Diagnosis not present

## 2015-10-05 DIAGNOSIS — Z419 Encounter for procedure for purposes other than remedying health state, unspecified: Secondary | ICD-10-CM

## 2015-10-05 HISTORY — DX: Nicotine dependence, unspecified, uncomplicated: F17.200

## 2015-10-05 HISTORY — DX: Displaced pilon fracture of left tibia, initial encounter for open fracture type IIIA, IIIB, or IIIC: S82.872C

## 2015-10-05 HISTORY — PX: EXTERNAL FIXATION LEG: SHX1549

## 2015-10-05 HISTORY — DX: Other fracture of upper and lower end of left fibula, initial encounter for open fracture type IIIA, IIIB, or IIIC: S82.832C

## 2015-10-05 HISTORY — PX: APPLICATION OF WOUND VAC: SHX5189

## 2015-10-05 HISTORY — PX: I & D EXTREMITY: SHX5045

## 2015-10-05 LAB — COMPREHENSIVE METABOLIC PANEL
ALT: 119 U/L — AB (ref 17–63)
AST: 253 U/L — ABNORMAL HIGH (ref 15–41)
Albumin: 4.3 g/dL (ref 3.5–5.0)
Alkaline Phosphatase: 61 U/L (ref 38–126)
Anion gap: 16 — ABNORMAL HIGH (ref 5–15)
BUN: 5 mg/dL — ABNORMAL LOW (ref 6–20)
CHLORIDE: 102 mmol/L (ref 101–111)
CO2: 18 mmol/L — ABNORMAL LOW (ref 22–32)
CREATININE: 1.04 mg/dL (ref 0.61–1.24)
Calcium: 9 mg/dL (ref 8.9–10.3)
Glucose, Bld: 108 mg/dL — ABNORMAL HIGH (ref 65–99)
POTASSIUM: 3.8 mmol/L (ref 3.5–5.1)
Sodium: 136 mmol/L (ref 135–145)
Total Bilirubin: 0.6 mg/dL (ref 0.3–1.2)
Total Protein: 7.6 g/dL (ref 6.5–8.1)

## 2015-10-05 LAB — PREPARE FRESH FROZEN PLASMA
UNIT DIVISION: 0
Unit division: 0

## 2015-10-05 LAB — ABO/RH: ABO/RH(D): A POS

## 2015-10-05 LAB — TYPE AND SCREEN
ABO/RH(D): A POS
ANTIBODY SCREEN: NEGATIVE
Unit division: 0
Unit division: 0

## 2015-10-05 LAB — CBC
HEMATOCRIT: 39.2 % (ref 39.0–52.0)
HEMOGLOBIN: 13.1 g/dL (ref 13.0–17.0)
MCH: 28.2 pg (ref 26.0–34.0)
MCHC: 33.4 g/dL (ref 30.0–36.0)
MCV: 84.3 fL (ref 78.0–100.0)
Platelets: 173 10*3/uL (ref 150–400)
RBC: 4.65 MIL/uL (ref 4.22–5.81)
RDW: 14.1 % (ref 11.5–15.5)
WBC: 7.8 10*3/uL (ref 4.0–10.5)

## 2015-10-05 LAB — PROTIME-INR
INR: 1.17
PROTHROMBIN TIME: 15 s (ref 11.4–15.2)

## 2015-10-05 LAB — ETHANOL: Alcohol, Ethyl (B): 278 mg/dL — ABNORMAL HIGH (ref <5)

## 2015-10-05 LAB — I-STAT CHEM 8, ED
BUN: 4 mg/dL — ABNORMAL LOW (ref 6–20)
Calcium, Ion: 0.97 mmol/L — ABNORMAL LOW (ref 1.15–1.40)
Chloride: 100 mmol/L — ABNORMAL LOW (ref 101–111)
Creatinine, Ser: 1.3 mg/dL — ABNORMAL HIGH (ref 0.61–1.24)
GLUCOSE: 113 mg/dL — AB (ref 65–99)
HEMATOCRIT: 42 % (ref 39.0–52.0)
HEMOGLOBIN: 14.3 g/dL (ref 13.0–17.0)
POTASSIUM: 3.8 mmol/L (ref 3.5–5.1)
Sodium: 137 mmol/L (ref 135–145)
TCO2: 18 mmol/L (ref 0–100)

## 2015-10-05 LAB — I-STAT CG4 LACTIC ACID, ED: Lactic Acid, Venous: 7.24 mmol/L (ref 0.5–1.9)

## 2015-10-05 LAB — CDS SEROLOGY

## 2015-10-05 LAB — BLOOD PRODUCT ORDER (VERBAL) VERIFICATION

## 2015-10-05 LAB — GENTAMICIN LEVEL, RANDOM: Gentamicin Rm: 2.1 ug/mL

## 2015-10-05 SURGERY — IRRIGATION AND DEBRIDEMENT EXTREMITY
Anesthesia: General | Site: Ankle | Laterality: Left

## 2015-10-05 MED ORDER — ENOXAPARIN SODIUM 40 MG/0.4ML ~~LOC~~ SOLN
40.0000 mg | SUBCUTANEOUS | Status: DC
  Administered 2015-10-06 – 2015-10-11 (×5): 40 mg via SUBCUTANEOUS
  Filled 2015-10-05 (×5): qty 0.4

## 2015-10-05 MED ORDER — ONDANSETRON HCL 4 MG/2ML IJ SOLN
INTRAMUSCULAR | Status: DC | PRN
Start: 2015-10-05 — End: 2015-10-05
  Administered 2015-10-05: 4 mg via INTRAVENOUS

## 2015-10-05 MED ORDER — ACETAMINOPHEN 650 MG RE SUPP: 650.0000 mg | Freq: Four times a day (QID) | RECTAL | Status: DC | PRN

## 2015-10-05 MED ORDER — HYDROMORPHONE HCL 1 MG/ML IJ SOLN
2.0000 mg | Freq: Once | INTRAMUSCULAR | Status: AC
  Administered 2015-10-05: 2 mg via INTRAVENOUS

## 2015-10-05 MED ORDER — FENTANYL CITRATE (PF) 100 MCG/2ML IJ SOLN
INTRAMUSCULAR | Status: AC
  Filled 2015-10-05: qty 2

## 2015-10-05 MED ORDER — VANCOMYCIN HCL 1000 MG IV SOLR
INTRAVENOUS | Status: AC
  Filled 2015-10-05: qty 1000

## 2015-10-05 MED ORDER — GENTAMICIN SULFATE 40 MG/ML IJ SOLN
INTRAMUSCULAR | Status: AC
  Filled 2015-10-05: qty 6

## 2015-10-05 MED ORDER — ONDANSETRON HCL 4 MG/2ML IJ SOLN: 4.0000 mg | Freq: Four times a day (QID) | INTRAMUSCULAR | Status: DC | PRN

## 2015-10-05 MED ORDER — GENTAMICIN SULFATE 40 MG/ML IJ SOLN
7.0000 mg/kg | INTRAVENOUS | Status: DC
  Administered 2015-10-05 – 2015-10-10 (×6): 560 mg via INTRAVENOUS
  Filled 2015-10-05 (×6): qty 14

## 2015-10-05 MED ORDER — IOPAMIDOL (ISOVUE-300) INJECTION 61%
INTRAVENOUS | Status: AC
  Administered 2015-10-05: 100 mL
  Filled 2015-10-05: qty 100

## 2015-10-05 MED ORDER — TETANUS-DIPHTH-ACELL PERTUSSIS 5-2.5-18.5 LF-MCG/0.5 IM SUSP
0.5000 mL | Freq: Once | INTRAMUSCULAR | Status: AC
  Administered 2015-10-05: 0.5 mL via INTRAMUSCULAR

## 2015-10-05 MED ORDER — POTASSIUM CHLORIDE IN NACL 20-0.9 MEQ/L-% IV SOLN
INTRAVENOUS | Status: AC
  Administered 2015-10-06: 07:00:00 via INTRAVENOUS
  Filled 2015-10-05: qty 1000

## 2015-10-05 MED ORDER — PROPOFOL 10 MG/ML IV BOLUS
INTRAVENOUS | Status: DC | PRN
  Administered 2015-10-05: 120 mg via INTRAVENOUS

## 2015-10-05 MED ORDER — LIDOCAINE HCL (CARDIAC) 20 MG/ML IV SOLN
INTRAVENOUS | Status: DC | PRN
  Administered 2015-10-05: 100 mg via INTRAVENOUS

## 2015-10-05 MED ORDER — MEPERIDINE HCL 25 MG/ML IJ SOLN: 6.2500 mg | INTRAMUSCULAR | Status: DC | PRN

## 2015-10-05 MED ORDER — METOCLOPRAMIDE HCL 5 MG PO TABS: 5.0000 mg | ORAL_TABLET | Freq: Three times a day (TID) | ORAL | Status: DC | PRN

## 2015-10-05 MED ORDER — THIAMINE HCL 100 MG/ML IJ SOLN: 100.0000 mg | Freq: Once | INTRAMUSCULAR | Status: DC

## 2015-10-05 MED ORDER — MIDAZOLAM HCL 2 MG/2ML IJ SOLN
INTRAMUSCULAR | Status: AC
  Filled 2015-10-05: qty 2

## 2015-10-05 MED ORDER — MORPHINE SULFATE (PF) 4 MG/ML IV SOLN
4.0000 mg | INTRAVENOUS | Status: DC | PRN
  Administered 2015-10-05 (×3): 4 mg via INTRAVENOUS
  Filled 2015-10-05 (×3): qty 1

## 2015-10-05 MED ORDER — LACTATED RINGERS IV SOLN
INTRAVENOUS | Status: DC | PRN
  Administered 2015-10-05: 08:00:00 via INTRAVENOUS

## 2015-10-05 MED ORDER — SODIUM CHLORIDE 0.9 % IV BOLUS (SEPSIS)
1000.0000 mL | Freq: Once | INTRAVENOUS | Status: AC
  Administered 2015-10-05: 700 mL via INTRAVENOUS

## 2015-10-05 MED ORDER — SUCCINYLCHOLINE 20MG/ML (10ML) SYRINGE FOR MEDFUSION PUMP - OPTIME
INTRAMUSCULAR | Status: DC | PRN
  Administered 2015-10-05: 140 mg via INTRAVENOUS

## 2015-10-05 MED ORDER — FENTANYL CITRATE (PF) 100 MCG/2ML IJ SOLN
INTRAMUSCULAR | Status: DC | PRN
  Administered 2015-10-05 (×4): 50 ug via INTRAVENOUS

## 2015-10-05 MED ORDER — FENTANYL CITRATE (PF) 100 MCG/2ML IJ SOLN
INTRAMUSCULAR | Status: AC | PRN
  Administered 2015-10-05: 100 ug via INTRAVENOUS

## 2015-10-05 MED ORDER — CEFAZOLIN SODIUM-DEXTROSE 2-4 GM/100ML-% IV SOLN
2.0000 g | Freq: Three times a day (TID) | INTRAVENOUS | Status: AC
  Administered 2015-10-05 – 2015-10-07 (×6): 2 g via INTRAVENOUS
  Filled 2015-10-05 (×6): qty 100

## 2015-10-05 MED ORDER — GENTAMICIN SULFATE 40 MG/ML IJ SOLN
INTRAMUSCULAR | Status: DC | PRN
  Administered 2015-10-05: 240 mg

## 2015-10-05 MED ORDER — HYDROMORPHONE HCL 1 MG/ML IJ SOLN
INTRAMUSCULAR | Status: AC
  Administered 2015-10-05: 0.5 mg via INTRAVENOUS
  Filled 2015-10-05: qty 1

## 2015-10-05 MED ORDER — CEFAZOLIN SODIUM 1 G IJ SOLR
INTRAMUSCULAR | Status: AC
  Filled 2015-10-05: qty 20

## 2015-10-05 MED ORDER — SODIUM CHLORIDE 0.9 % IR SOLN: Status: DC | PRN

## 2015-10-05 MED ORDER — SODIUM CHLORIDE 0.9 % IV BOLUS (SEPSIS): 1000.0000 mL | Freq: Once | INTRAVENOUS | Status: DC

## 2015-10-05 MED ORDER — CEFAZOLIN SODIUM-DEXTROSE 2-4 GM/100ML-% IV SOLN
2.0000 g | Freq: Once | INTRAVENOUS | Status: AC
  Administered 2015-10-05: 2 g via INTRAVENOUS

## 2015-10-05 MED ORDER — ONDANSETRON HCL 4 MG PO TABS: 4.0000 mg | ORAL_TABLET | Freq: Four times a day (QID) | ORAL | Status: DC | PRN

## 2015-10-05 MED ORDER — SODIUM CHLORIDE 0.9 % IV SOLN
INTRAVENOUS | Status: AC | PRN
  Administered 2015-10-05: 1000 mL via INTRAVENOUS

## 2015-10-05 MED ORDER — METHOCARBAMOL 500 MG PO TABS
500.0000 mg | ORAL_TABLET | Freq: Four times a day (QID) | ORAL | Status: DC | PRN
  Administered 2015-10-05 – 2015-10-11 (×16): 500 mg via ORAL
  Filled 2015-10-05 (×16): qty 1

## 2015-10-05 MED ORDER — ACETAMINOPHEN 325 MG PO TABS
650.0000 mg | ORAL_TABLET | Freq: Four times a day (QID) | ORAL | Status: DC | PRN
  Administered 2015-10-05 – 2015-10-06 (×3): 650 mg via ORAL
  Filled 2015-10-05 (×4): qty 2

## 2015-10-05 MED ORDER — 0.9 % SODIUM CHLORIDE (POUR BTL) OPTIME
TOPICAL | Status: DC | PRN
  Administered 2015-10-05: 6000 mL

## 2015-10-05 MED ORDER — PROPOFOL 10 MG/ML IV BOLUS
INTRAVENOUS | Status: AC
  Filled 2015-10-05: qty 40

## 2015-10-05 MED ORDER — FENTANYL CITRATE (PF) 100 MCG/2ML IJ SOLN: 100.0000 ug | Freq: Once | INTRAMUSCULAR | Status: DC

## 2015-10-05 MED ORDER — OXYCODONE HCL 5 MG PO TABS
5.0000 mg | ORAL_TABLET | ORAL | Status: DC | PRN
  Administered 2015-10-05 – 2015-10-06 (×8): 10 mg via ORAL
  Filled 2015-10-05 (×7): qty 2

## 2015-10-05 MED ORDER — HYDROMORPHONE HCL 1 MG/ML IJ SOLN
INTRAMUSCULAR | Status: AC
  Administered 2015-10-05: 0.25 mg via INTRAVENOUS
  Filled 2015-10-05: qty 1

## 2015-10-05 MED ORDER — MIDAZOLAM HCL 5 MG/5ML IJ SOLN
INTRAMUSCULAR | Status: DC | PRN
  Administered 2015-10-05 (×2): 1 mg via INTRAVENOUS

## 2015-10-05 MED ORDER — METOCLOPRAMIDE HCL 5 MG/ML IJ SOLN: 5.0000 mg | Freq: Three times a day (TID) | INTRAMUSCULAR | Status: DC | PRN

## 2015-10-05 MED ORDER — ONDANSETRON HCL 4 MG/2ML IJ SOLN: 4.0000 mg | Freq: Once | INTRAMUSCULAR | Status: DC | PRN

## 2015-10-05 MED ORDER — SODIUM CHLORIDE 0.9 % IV SOLN
INTRAVENOUS | Status: DC
  Administered 2015-10-05: 08:00:00 via INTRAVENOUS

## 2015-10-05 MED ORDER — VANCOMYCIN HCL 1000 MG IV SOLR
INTRAVENOUS | Status: DC | PRN
  Administered 2015-10-05: 1000 mg

## 2015-10-05 MED ORDER — METHOCARBAMOL 1000 MG/10ML IJ SOLN
500.0000 mg | Freq: Four times a day (QID) | INTRAVENOUS | Status: DC | PRN
  Filled 2015-10-05: qty 5

## 2015-10-05 MED ORDER — HYDROMORPHONE HCL 1 MG/ML IJ SOLN
INTRAMUSCULAR | Status: AC
  Filled 2015-10-05: qty 2

## 2015-10-05 MED ORDER — OXYCODONE HCL 5 MG PO TABS
ORAL_TABLET | ORAL | Status: AC
  Administered 2015-10-05: 10 mg via ORAL
  Filled 2015-10-05: qty 2

## 2015-10-05 MED ORDER — HYDROMORPHONE HCL 1 MG/ML IJ SOLN
0.2500 mg | INTRAMUSCULAR | Status: DC | PRN
  Administered 2015-10-05: 0.5 mg via INTRAVENOUS
  Administered 2015-10-05: 0.25 mg via INTRAVENOUS
  Administered 2015-10-05 (×2): 0.5 mg via INTRAVENOUS
  Administered 2015-10-05: 0.25 mg via INTRAVENOUS

## 2015-10-05 MED ORDER — DEXTROSE 5 % IV SOLN
INTRAVENOUS | Status: AC | PRN
  Administered 2015-10-05: 2000 mg via INTRAVENOUS

## 2015-10-05 SURGICAL SUPPLY — 85 items
BANDAGE ACE 4X5 VEL STRL LF (GAUZE/BANDAGES/DRESSINGS) ×6 IMPLANT
BANDAGE ACE 6X5 VEL STRL LF (GAUZE/BANDAGES/DRESSINGS) IMPLANT
BANDAGE ESMARK 6X9 LF (GAUZE/BANDAGES/DRESSINGS) IMPLANT
BAR GLASS FIBER EXFX 11X300 (EXFIX) ×4 IMPLANT
BNDG COHESIVE 4X5 TAN STRL (GAUZE/BANDAGES/DRESSINGS) IMPLANT
BNDG COHESIVE 6X5 TAN STRL LF (GAUZE/BANDAGES/DRESSINGS) IMPLANT
BNDG ESMARK 6X9 LF (GAUZE/BANDAGES/DRESSINGS)
BNDG GAUZE ELAST 4 BULKY (GAUZE/BANDAGES/DRESSINGS) ×2 IMPLANT
CANISTER WOUND CARE 500ML ATS (WOUND CARE) ×2 IMPLANT
CLAMP BLUE BAR TO PIN (EXFIX) ×4 IMPLANT
CLAMP PIN 2 BAR 45MM EXFIX (EXFIX) ×2 IMPLANT
CLEANER TIP ELECTROSURG 2X2 (MISCELLANEOUS) ×2 IMPLANT
COVER SURGICAL LIGHT HANDLE (MISCELLANEOUS) ×2 IMPLANT
CUFF TOURNIQUET SINGLE 18IN (TOURNIQUET CUFF) ×2 IMPLANT
CUFF TOURNIQUET SINGLE 24IN (TOURNIQUET CUFF) IMPLANT
CUFF TOURNIQUET SINGLE 34IN LL (TOURNIQUET CUFF) IMPLANT
CUFF TOURNIQUET SINGLE 44IN (TOURNIQUET CUFF) IMPLANT
DRAPE C-ARM 42X72 X-RAY (DRAPES) ×2 IMPLANT
DRAPE C-ARMOR (DRAPES) ×2 IMPLANT
DRAPE OEC MINIVIEW 54X84 (DRAPES) IMPLANT
DRAPE U-SHAPE 47X51 STRL (DRAPES) ×2 IMPLANT
DRSG ADAPTIC 3X8 NADH LF (GAUZE/BANDAGES/DRESSINGS) IMPLANT
DRSG PAD ABDOMINAL 8X10 ST (GAUZE/BANDAGES/DRESSINGS) IMPLANT
DRSG VAC ATS MED SENSATRAC (GAUZE/BANDAGES/DRESSINGS) ×2 IMPLANT
DURAPREP 26ML APPLICATOR (WOUND CARE) IMPLANT
ELECT REM PT RETURN 9FT ADLT (ELECTROSURGICAL) ×2
ELECTRODE REM PT RTRN 9FT ADLT (ELECTROSURGICAL) ×1 IMPLANT
EVACUATOR 1/8 PVC DRAIN (DRAIN) IMPLANT
FACESHIELD WRAPAROUND (MASK) ×2 IMPLANT
GAUZE SPONGE 4X4 12PLY STRL (GAUZE/BANDAGES/DRESSINGS) ×2 IMPLANT
GAUZE XEROFORM 5X9 LF (GAUZE/BANDAGES/DRESSINGS) ×2 IMPLANT
GLOVE BIO SURGEON STRL SZ7 (GLOVE) ×4 IMPLANT
GLOVE BIOGEL PI IND STRL 6.5 (GLOVE) ×3 IMPLANT
GLOVE BIOGEL PI IND STRL 8 (GLOVE) ×2 IMPLANT
GLOVE BIOGEL PI INDICATOR 6.5 (GLOVE) ×3
GLOVE BIOGEL PI INDICATOR 8 (GLOVE) ×2
GLOVE SURG ORTHO 8.0 STRL STRW (GLOVE) ×2 IMPLANT
GLOVE SURG SS PI 7.5 STRL IVOR (GLOVE) ×2 IMPLANT
GOWN STRL REUS W/ TWL LRG LVL3 (GOWN DISPOSABLE) ×3 IMPLANT
GOWN STRL REUS W/ TWL XL LVL3 (GOWN DISPOSABLE) ×2 IMPLANT
GOWN STRL REUS W/TWL LRG LVL3 (GOWN DISPOSABLE) ×3
GOWN STRL REUS W/TWL XL LVL3 (GOWN DISPOSABLE) ×2
HANDPIECE INTERPULSE COAX TIP (DISPOSABLE)
KIT BASIN OR (CUSTOM PROCEDURE TRAY) ×2 IMPLANT
KIT ROOM TURNOVER OR (KITS) ×2 IMPLANT
KIT STIMULAN RAPID CURE  10CC (Orthopedic Implant) ×1 IMPLANT
KIT STIMULAN RAPID CURE 10CC (Orthopedic Implant) ×1 IMPLANT
MANIFOLD NEPTUNE II (INSTRUMENTS) ×2 IMPLANT
NEEDLE 22X1 1/2 (OR ONLY) (NEEDLE) IMPLANT
NS IRRIG 1000ML POUR BTL (IV SOLUTION) ×12 IMPLANT
PACK ORTHO EXTREMITY (CUSTOM PROCEDURE TRAY) ×2 IMPLANT
PAD ARMBOARD 7.5X6 YLW CONV (MISCELLANEOUS) ×4 IMPLANT
PAD CAST 4YDX4 CTTN HI CHSV (CAST SUPPLIES) IMPLANT
PADDING CAST COTTON 4X4 STRL (CAST SUPPLIES)
PADDING CAST COTTON 6X4 STRL (CAST SUPPLIES) IMPLANT
PIN HALF YELLOW 5X160X35 (EXFIX) ×6 IMPLANT
PIN TRANSFIXING 5.0 (EXFIX) ×2 IMPLANT
SET HNDPC FAN SPRY TIP SCT (DISPOSABLE) IMPLANT
SPONGE LAP 18X18 X RAY DECT (DISPOSABLE) ×6 IMPLANT
SPONGE LAP 4X18 X RAY DECT (DISPOSABLE) ×2 IMPLANT
SPONGE SCRUB IODOPHOR (GAUZE/BANDAGES/DRESSINGS) ×2 IMPLANT
STAPLER VISISTAT 35W (STAPLE) IMPLANT
STOCKINETTE IMPERVIOUS 9X36 MD (GAUZE/BANDAGES/DRESSINGS) ×2 IMPLANT
STOCKINETTE IMPERVIOUS LG (DRAPES) IMPLANT
STRIP CLOSURE SKIN 1/2X4 (GAUZE/BANDAGES/DRESSINGS) IMPLANT
SUCTION FRAZIER HANDLE 10FR (MISCELLANEOUS)
SUCTION TUBE FRAZIER 10FR DISP (MISCELLANEOUS) IMPLANT
SUT ETHILON 2 0 FS 18 (SUTURE) ×4 IMPLANT
SUT ETHILON 3 0 PS 1 (SUTURE) IMPLANT
SUT ETHILON 4 0 PS 2 18 (SUTURE) IMPLANT
SUT PROLENE 3 0 PS 2 (SUTURE) IMPLANT
SUT VIC AB 0 CT1 27 (SUTURE)
SUT VIC AB 0 CT1 27XBRD ANBCTR (SUTURE) IMPLANT
SUT VIC AB 2-0 CT1 27 (SUTURE)
SUT VIC AB 2-0 CT1 TAPERPNT 27 (SUTURE) IMPLANT
SUT VIC AB 3-0 SH 27 (SUTURE) ×1
SUT VIC AB 3-0 SH 27X BRD (SUTURE) ×1 IMPLANT
SYR CONTROL 10ML LL (SYRINGE) IMPLANT
TOWEL OR 17X24 6PK STRL BLUE (TOWEL DISPOSABLE) ×4 IMPLANT
TOWEL OR 17X26 10 PK STRL BLUE (TOWEL DISPOSABLE) ×2 IMPLANT
TUBE ANAEROBIC SPECIMEN COL (MISCELLANEOUS) IMPLANT
TUBE CONNECTING 12X1/4 (SUCTIONS) ×2 IMPLANT
UNDERPAD 30X30 (UNDERPADS AND DIAPERS) ×2 IMPLANT
WATER STERILE IRR 1000ML POUR (IV SOLUTION) ×2 IMPLANT
YANKAUER SUCT BULB TIP NO VENT (SUCTIONS) ×2 IMPLANT
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
## 2015-10-05 NOTE — Anesthesia Preprocedure Evaluation (Signed)
Anesthesia Evaluation  Patient identified by MRN, date of birth, ID band Patient awake    Reviewed: Allergy & Precautions, NPO status , Patient's Chart, lab work & pertinent test results  Airway Mallampati: I  TM Distance: >3 FB Neck ROM: Full    Dental   Pulmonary Current Smoker,    Pulmonary exam normal        Cardiovascular Normal cardiovascular exam     Neuro/Psych    GI/Hepatic   Endo/Other    Renal/GU      Musculoskeletal   Abdominal   Peds  Hematology   Anesthesia Other Findings   Reproductive/Obstetrics                             Anesthesia Physical Anesthesia Plan  ASA: II  Anesthesia Plan: General   Post-op Pain Management:    Induction: Intravenous, Rapid sequence and Cricoid pressure planned  Airway Management Planned: Oral ETT  Additional Equipment:   Intra-op Plan:   Post-operative Plan: Extubation in OR  Informed Consent: I have reviewed the patients History and Physical, chart, labs and discussed the procedure including the risks, benefits and alternatives for the proposed anesthesia with the patient or authorized representative who has indicated his/her understanding and acceptance.     Plan Discussed with: CRNA and Surgeon  Anesthesia Plan Comments:         Anesthesia Quick Evaluation  
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
## 2015-10-05 NOTE — Anesthesia Procedure Notes (Signed)
Procedure Name: Intubation Date/Time: 10/05/2015 7:59 AM Performed by: Margaree MackintoshYACOUB, Damon Alexander Pre-anesthesia Checklist: Patient identified, Emergency Drugs available, Suction available, Patient being monitored and Timeout performed Patient Re-evaluated:Patient Re-evaluated prior to inductionOxygen Delivery Method: Circle system utilized Preoxygenation: Pre-oxygenation with 100% oxygen Intubation Type: IV induction, Rapid sequence and Cricoid Pressure applied Laryngoscope Size: Mac and 4 Grade View: Grade III Tube type: Oral Tube size: 7.5 mm Number of attempts: 1 Airway Equipment and Method: Stylet Placement Confirmation: ETT inserted through vocal cords under direct vision,  positive ETCO2 and breath sounds checked- equal and bilateral Secured at: 21 cm Tube secured with: Tape Dental Injury: Teeth and Oropharynx as per pre-operative assessment
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
## 2015-10-05 NOTE — Brief Op Note (Signed)
10/05/2015  9:12 AM  PATIENT:  Damon Alexander  30 y.o. male  PRE-OPERATIVE DIAGNOSIS:  trauma open left ankle fracture  POST-OPERATIVE DIAGNOSIS:  trauma open left ankle fracture  PROCEDURE:  Procedure(s): IRRIGATION AND DEBRIDEMENT EXTREMITY LEFT ANKLE & LEFT  LOWER LEG,PLACEMENT OF ANTIBIOTIC BEADS EXTERNAL FIXATION LEG LEFT ANKLE & LEFT LOWER LEG APPLICATION OF WOUND VAC  SURGEON:  Surgeon(s): Cammy CopaScott Gregory Dean, MD  ASSISTANT: Hart CarwinJustin Queen RNFA  ANESTHESIA:   general  EBL: 50 ml    No intake/output data recorded.  BLOOD ADMINISTERED: none  DRAINS: wound vac and abx beads   LOCAL MEDICATIONS USED:  none  SPECIMEN:  No Specimen  COUNTS:  YES  TOURNIQUET:  * No tourniquets in log *  DICTATION: .Other Dictation: Dictation Number [id number]  PLAN OF CARE: Admit to inpatient   PATIENT DISPOSITION:  PACU - hemodynamically stable

## 2015-10-05 NOTE — Progress Notes (Signed)
Pt continues to complain about pain asking for pain medication Q4h with relief

## 2015-10-05 NOTE — Anesthesia Postprocedure Evaluation (Signed)
Anesthesia Post Note  Patient: Damon Alexander  Procedure(s) Performed: Procedure(s) (LRB): IRRIGATION AND DEBRIDEMENT EXTREMITY LEFT ANKLE & LEFT  LOWER LEG,PLACEMENT OF ANTIBIOTIC BEADS (Left) EXTERNAL FIXATION LEG LEFT ANKLE & LEFT LOWER LEG (Left) APPLICATION OF WOUND VAC (Left)  Patient location during evaluation: PACU Anesthesia Type: General Level of consciousness: awake and alert Pain management: pain level controlled Vital Signs Assessment: post-procedure vital signs reviewed and stable Respiratory status: spontaneous breathing, nonlabored ventilation, respiratory function stable and patient connected to nasal cannula oxygen Cardiovascular status: blood pressure returned to baseline and stable Postop Assessment: no signs of nausea or vomiting Anesthetic complications: no    Last Vitals:  Vitals:   10/05/15 1115 10/05/15 1202  BP: (!) 154/99 (!) 144/95  Pulse: 93 (!) 107  Resp: 12 16  Temp: 36.7 C 37.1 C    Last Pain:  Vitals:   10/05/15 1202  TempSrc: Oral  PainSc:                  Damon Alexander DAVID

## 2015-10-05 NOTE — Progress Notes (Signed)
Patient ID: Jill SideSamuel Mondesir, male   DOB: 1985-08-10, 30 y.o.   MRN: [id number] CTs reviewed. Neck exam with no posterior tenderness and no pain on AROM. Collar removed. Violeta GelinasBurke Amri Lien, MD, MPH, FACS Trauma: [phone] General Surgery: [phone]

## 2015-10-05 NOTE — Transfer of Care (Signed)
Immediate Anesthesia Transfer of Care Note  Patient: Damon Alexander  Procedure(s) Performed: Procedure(s): IRRIGATION AND DEBRIDEMENT EXTREMITY LEFT ANKLE & LEFT  LOWER LEG,PLACEMENT OF ANTIBIOTIC BEADS (Left) EXTERNAL FIXATION LEG LEFT ANKLE & LEFT LOWER LEG (Left) APPLICATION OF WOUND VAC (Left)  Patient Location: PACU  Anesthesia Type:General  Level of Consciousness: awake, alert  and oriented  Airway & Oxygen Therapy: Patient Spontanous Breathing  Post-op Assessment: Report given to RN and Post -op Vital signs reviewed and stable  Post vital signs: Reviewed and stable  Last Vitals:  Vitals:   10/05/15 0623 10/05/15 0630  BP: 166/96   Pulse:  (!) 123  Resp:  17  Temp:      Last Pain:  Vitals:   10/05/15 0700  PainSc: 8          Complications: No apparent anesthesia complications

## 2015-10-05 NOTE — ED Notes (Signed)
Sent pt's belongings to [id number]

## 2015-10-06 ENCOUNTER — Inpatient Hospital Stay (HOSPITAL_COMMUNITY): Payer: Self-pay

## 2015-10-06 LAB — BASIC METABOLIC PANEL
Anion gap: 12 (ref 5–15)
BUN: <5 mg/dL — ABNORMAL LOW (ref 6–20)
CALCIUM: 9.1 mg/dL (ref 8.9–10.3)
CHLORIDE: 94 mmol/L — AB (ref 101–111)
CO2: 25 mmol/L (ref 22–32)
CREATININE: 0.82 mg/dL (ref 0.61–1.24)
GFR calc Af Amer: >60 mL/min (ref >60)
GFR calc non Af Amer: >60 mL/min (ref >60)
GLUCOSE: 91 mg/dL (ref 65–99)
Potassium: 3.9 mmol/L (ref 3.5–5.1)
Sodium: 131 mmol/L — ABNORMAL LOW (ref 135–145)

## 2015-10-06 MED ORDER — POLYETHYLENE GLYCOL 3350 17 G PO PACK
17.0000 g | PACK | Freq: Two times a day (BID) | ORAL | Status: DC
  Administered 2015-10-06 – 2015-10-11 (×2): 17 g via ORAL
  Filled 2015-10-06 (×7): qty 1

## 2015-10-06 MED ORDER — HYDROMORPHONE HCL 1 MG/ML IJ SOLN
1.0000 mg | INTRAMUSCULAR | Status: DC | PRN
  Administered 2015-10-06 – 2015-10-12 (×21): 1 mg via INTRAVENOUS
  Filled 2015-10-06 (×21): qty 1
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
## 2015-10-06 NOTE — Progress Notes (Signed)
Subjective: 1 Day Post-Op Procedure(s) (LRB): IRRIGATION AND DEBRIDEMENT EXTREMITY LEFT ANKLE & LEFT  LOWER LEG,PLACEMENT OF ANTIBIOTIC BEADS (Left) EXTERNAL FIXATION LEG LEFT ANKLE & LEFT LOWER LEG (Left) APPLICATION OF WOUND VAC (Left) Patient reports pain as moderate.  Denies other injuries.  Objective: Vital signs in last 24 hours: Temp:  [98 F (36.7 C)-99.5 F (37.5 C)] 99.5 F (37.5 C) (09/02 0453) Pulse Rate:  [75-119] 85 (09/02 0453) Resp:  [12-23] 16 (09/02 0453) BP: (144-172)/(89-138) 152/89 (09/02 0453) SpO2:  [95 %-100 %] 98 % (09/02 0453)  Intake/Output from previous day: 09/01 0701 - 09/02 0700 In: 1814 [I.V.:1600; IV Piggyback:214] Out: 1750 [Urine:1400; Drains:300; Blood:50] Intake/Output this shift: No intake/output data recorded.   Recent Labs  10/05/15 0535 10/05/15 0548  HGB 13.1 14.3    Recent Labs  10/05/15 0535 10/05/15 0548  WBC 7.8  --   RBC 4.65  --   HCT 39.2 42.0  PLT 173  --     Recent Labs  10/05/15 0535 10/05/15 0548 10/06/15 0236  NA 136 137 131*  K 3.8 3.8 3.9  CL 102 100* 94*  CO2 18*  --  25  BUN 5* 4* <5*  CREATININE 1.04 1.30* 0.82  GLUCOSE 108* 113* 91  CALCIUM 9.0  --  9.1    Recent Labs  10/05/15 0535  INR 1.17    Sensation intact distally Intact pulses distally Incision: dressing C/D/I Compartment soft VAC with good seal.   Assessment/Plan: 1 Day Post-Op Procedure(s) (LRB): IRRIGATION AND DEBRIDEMENT EXTREMITY LEFT ANKLE & LEFT  LOWER LEG,PLACEMENT OF ANTIBIOTIC BEADS (Left) EXTERNAL FIXATION LEG LEFT ANKLE & LEFT LOWER LEG (Left) APPLICATION OF WOUND VAC (Left) Up with therapy - NWB left ankle Continue VAC. Will order CT scan of the left ankle if not done so already to assess the fracture and alignment post-ex/fix for future surgical planning.  Kathryne HitchChristopher Y Kemuel Buchmann 10/06/2015, 8:20 AM
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
## 2015-10-07 LAB — SURGICAL PCR SCREEN
MRSA, PCR: NEGATIVE
Staphylococcus aureus: NEGATIVE

## 2015-10-07 LAB — BASIC METABOLIC PANEL
Anion gap: 11 (ref 5–15)
CALCIUM: 9.4 mg/dL (ref 8.9–10.3)
CO2: 29 mmol/L (ref 22–32)
CREATININE: 0.81 mg/dL (ref 0.61–1.24)
Chloride: 93 mmol/L — ABNORMAL LOW (ref 101–111)
GFR calc Af Amer: >60 mL/min (ref >60)
GFR calc non Af Amer: >60 mL/min (ref >60)
GLUCOSE: 109 mg/dL — AB (ref 65–99)
Potassium: 3.8 mmol/L (ref 3.5–5.1)
Sodium: 133 mmol/L — ABNORMAL LOW (ref 135–145)

## 2015-10-07 LAB — CBC
HEMATOCRIT: 31.4 % — AB (ref 39.0–52.0)
Hemoglobin: 10.4 g/dL — ABNORMAL LOW (ref 13.0–17.0)
MCH: 28.2 pg (ref 26.0–34.0)
MCHC: 33.4 g/dL (ref 30.0–36.0)
MCV: 84.4 fL (ref 78.0–100.0)
PLATELETS: 134 10*3/uL — AB (ref 150–400)
RBC: 3.72 MIL/uL — ABNORMAL LOW (ref 4.22–5.81)
RDW: 13.8 % (ref 11.5–15.5)
WBC: 9.7 10*3/uL (ref 4.0–10.5)

## 2015-10-07 MED ORDER — GABAPENTIN 600 MG PO TABS
300.0000 mg | ORAL_TABLET | Freq: Two times a day (BID) | ORAL | Status: DC
  Administered 2015-10-07 – 2015-10-11 (×9): 300 mg via ORAL
  Filled 2015-10-07 (×9): qty 1

## 2015-10-07 MED ORDER — HYDRALAZINE HCL 10 MG PO TABS: 10.0000 mg | ORAL_TABLET | Freq: Three times a day (TID) | ORAL | Status: DC | PRN

## 2015-10-07 MED ORDER — ACETAMINOPHEN 500 MG PO TABS
1000.0000 mg | ORAL_TABLET | Freq: Four times a day (QID) | ORAL | Status: DC
  Administered 2015-10-07 – 2015-10-12 (×18): 1000 mg via ORAL
  Filled 2015-10-07 (×18): qty 2

## 2015-10-07 MED ORDER — OXYCODONE HCL 5 MG PO TABS
5.0000 mg | ORAL_TABLET | ORAL | Status: DC | PRN
  Administered 2015-10-07 – 2015-10-08 (×6): 15 mg via ORAL
  Administered 2015-10-08: 10 mg via ORAL
  Administered 2015-10-09 – 2015-10-10 (×4): 5 mg via ORAL
  Administered 2015-10-10 – 2015-10-12 (×9): 15 mg via ORAL
  Filled 2015-10-07: qty 2
  Filled 2015-10-07 (×2): qty 3
  Filled 2015-10-07: qty 1
  Filled 2015-10-07 (×4): qty 3
  Filled 2015-10-07 (×2): qty 1
  Filled 2015-10-07 (×8): qty 3
  Filled 2015-10-07: qty 1
  Filled 2015-10-07: qty 3

## 2015-10-07 MED ORDER — CEFAZOLIN SODIUM-DEXTROSE 2-4 GM/100ML-% IV SOLN
2.0000 g | Freq: Three times a day (TID) | INTRAVENOUS | Status: DC
  Administered 2015-10-07 – 2015-10-11 (×14): 2 g via INTRAVENOUS
  Filled 2015-10-07 (×15): qty 100
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
## 2015-10-07 NOTE — Progress Notes (Signed)
Subjective: 2 Days Post-Op Procedure(s) (LRB): IRRIGATION AND DEBRIDEMENT EXTREMITY LEFT ANKLE & LEFT  LOWER LEG,PLACEMENT OF ANTIBIOTIC BEADS (Left) EXTERNAL FIXATION LEG LEFT ANKLE & LEFT LOWER LEG (Left) APPLICATION OF WOUND VAC (Left) Patient reports pain as moderate.  CT scan yesterday of left ankle to assess alignment and extent of his left ankle trauma.  Objective: Vital signs in last 24 hours: Temp:  [98.8 F (37.1 C)-99.8 F (37.7 C)] 99.8 F (37.7 C) (09/03 0440) Pulse Rate:  [96-114] 114 (09/03 0440) Resp:  [16-18] 18 (09/03 0440) BP: (148-160)/(77-84) 153/84 (09/03 0440) SpO2:  [97 %-99 %] 99 % (09/03 0440)  Intake/Output from previous day: 09/02 0701 - 09/03 0700 In: 1120 [P.O.:1120] Out: 3600 [Urine:3600] Intake/Output this shift: No intake/output data recorded.   Recent Labs  10/05/15 0535 10/05/15 0548 10/07/15 0232  HGB 13.1 14.3 10.4*    Recent Labs  10/05/15 0535 10/05/15 0548 10/07/15 0232  WBC 7.8  --  9.7  RBC 4.65  --  3.72*  HCT 39.2 42.0 31.4*  PLT 173  --  134*    Recent Labs  10/06/15 0236 10/07/15 0232  NA 131* 133*  K 3.9 3.8  CL 94* 93*  CO2 25 29  BUN <5* <5*  CREATININE 0.82 0.81  GLUCOSE 91 109*  CALCIUM 9.1 9.4    Recent Labs  10/05/15 0535  INR 1.17    Sensation intact distally Intact pulses distally Compartment soft VAC with good seal   Assessment/Plan: 2 Days Post-Op Procedure(s) (LRB): IRRIGATION AND DEBRIDEMENT EXTREMITY LEFT ANKLE & LEFT  LOWER LEG,PLACEMENT OF ANTIBIOTIC BEADS (Left) EXTERNAL FIXATION LEG LEFT ANKLE & LEFT LOWER LEG (Left) APPLICATION OF WOUND VAC (Left) Will need definitive fixation of the left ankle after his initial soft tissue insult resolves.  OK to be on Ortho service at this standpoint given no further active trauma issues.  Kathryne HitchChristopher Y Wanita Derenzo 10/07/2015, 9:07 AM
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
## 2015-10-08 ENCOUNTER — Encounter (HOSPITAL_COMMUNITY): Payer: Self-pay | Admitting: Orthopedic Surgery

## 2015-10-08 LAB — COMPREHENSIVE METABOLIC PANEL
ALK PHOS: 56 U/L (ref 38–126)
ALT: 55 U/L (ref 17–63)
AST: 74 U/L — ABNORMAL HIGH (ref 15–41)
Albumin: 3.5 g/dL (ref 3.5–5.0)
Anion gap: 7 (ref 5–15)
BILIRUBIN TOTAL: 0.7 mg/dL (ref 0.3–1.2)
BUN: 6 mg/dL (ref 6–20)
CALCIUM: 9 mg/dL (ref 8.9–10.3)
CO2: 33 mmol/L — AB (ref 22–32)
CREATININE: 0.89 mg/dL (ref 0.61–1.24)
Chloride: 90 mmol/L — ABNORMAL LOW (ref 101–111)
Glucose, Bld: 119 mg/dL — ABNORMAL HIGH (ref 65–99)
Potassium: 3.4 mmol/L — ABNORMAL LOW (ref 3.5–5.1)
Sodium: 130 mmol/L — ABNORMAL LOW (ref 135–145)
TOTAL PROTEIN: 6.7 g/dL (ref 6.5–8.1)

## 2015-10-08 MED ORDER — ADULT MULTIVITAMIN W/MINERALS CH
1.0000 | ORAL_TABLET | Freq: Every day | ORAL | Status: DC
  Administered 2015-10-08 – 2015-10-11 (×3): 1 via ORAL
  Filled 2015-10-08 (×3): qty 1

## 2015-10-08 MED ORDER — VITAMIN B-1 100 MG PO TABS
100.0000 mg | ORAL_TABLET | Freq: Every day | ORAL | Status: DC
  Administered 2015-10-08 – 2015-10-11 (×3): 100 mg via ORAL
  Filled 2015-10-08 (×3): qty 1

## 2015-10-08 MED ORDER — THIAMINE HCL 100 MG/ML IJ SOLN: 100.0000 mg | Freq: Every day | INTRAMUSCULAR | Status: DC

## 2015-10-08 MED ORDER — LORAZEPAM 2 MG/ML IJ SOLN
1.0000 mg | Freq: Four times a day (QID) | INTRAMUSCULAR | Status: AC | PRN
  Administered 2015-10-09: 1 mg via INTRAVENOUS
  Filled 2015-10-08: qty 1

## 2015-10-08 MED ORDER — LORAZEPAM 1 MG PO TABS
1.0000 mg | ORAL_TABLET | Freq: Four times a day (QID) | ORAL | Status: AC | PRN
  Administered 2015-10-08 (×2): 1 mg via ORAL
  Filled 2015-10-08 (×2): qty 1

## 2015-10-08 MED ORDER — FOLIC ACID 1 MG PO TABS
1.0000 mg | ORAL_TABLET | Freq: Every day | ORAL | Status: DC
  Administered 2015-10-08 – 2015-10-11 (×3): 1 mg via ORAL
  Filled 2015-10-08 (×3): qty 1

## 2015-10-08 NOTE — Progress Notes (Signed)
Pt left the unit via wheelchair unnoticed; he was searched throughout the unit but could not be found; wound vac found on the couch disconnected from patient; around 2130, 30 minutes later, he returned wheeling himself to his room. When asked where he went, he stated he went 'down the block'. Pt progressively displays visual and auditory hallucinations. IV on  left arm pulled out by pt.  Dr. Magnus IvanBlackman paged and notified of the event and gave no additional order but to continue CIWA protocol, wound vac can be left out for now. Will continue to monitor.

## 2015-10-08 NOTE — Progress Notes (Signed)
Patient ID: Damon Alexander, male   DOB: [birth year], 30 y.o.   MRN: [id number] No acute changes.  He understands that Dr. Carola FrostHandy will be taking him to there OR tomorrow for further surgery on his ankle.  VAC still with good seal and ex/fix intact.  NPO after midnight tonight.

## 2015-10-08 NOTE — Progress Notes (Signed)
On call MD, Dr. Magnus IvanBlackman notified of patient having visual and auditory disturbances.  Patient was initially looking for family members by entering other patients rooms.  Patient escorted back to room and informed that he was in the hospital.  Patient later found with wheelchair in the hallway, he indicated he could not go back in his room because there were "2 dudes under my bed."  Patient informed that no one was under his bed and escorted back to his room.  Patient has also repeatedly disconnected his wound vac and IV stating he felt he was being tied down.  Patient's aunt came to visit at 1600 indicates that he had been calling various family members with vivid stories.  Patient's aunt indicates she was worried about his delusions.  Asked patient about his alcohol consumption, patient indicates he drinks beers.  Patient states he drinks at least 4-5 beers on a daily basis.  Patient screened using CIWA protocol, negative for all criteria with the exception of visual and auditory disturbances.  Both patient and aunt educated on signs and symptoms of alcohol withdrawal.  Patient agreeable to medication to allow him to relax.  MD approved CIWA protocol.  Will continue to monitor.

## 2015-10-08 NOTE — Progress Notes (Signed)
Found pt out of bed in wheelchair. He had disconnected his wound vac and his IV and was planning to take a shower. He was very irritable and only reluctantly had tubing reconnected so he could receive his antibiotic.
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
## 2015-10-09 ENCOUNTER — Encounter (HOSPITAL_COMMUNITY)
Admission: EM | Disposition: A | Discharge status: Home / Self Care | Payer: Self-pay | Source: Home / Self Care | Attending: Orthopedic Surgery

## 2015-10-09 ENCOUNTER — Inpatient Hospital Stay (HOSPITAL_COMMUNITY): Payer: Self-pay

## 2015-10-09 ENCOUNTER — Inpatient Hospital Stay (HOSPITAL_COMMUNITY): Payer: Self-pay | Admitting: Anesthesiology

## 2015-10-09 HISTORY — PX: APPLICATION OF WOUND VAC: SHX5189

## 2015-10-09 HISTORY — PX: I & D EXTREMITY: SHX5045

## 2015-10-09 LAB — CBC
HCT: 29.6 % — ABNORMAL LOW (ref 39.0–52.0)
Hemoglobin: 9.5 g/dL — ABNORMAL LOW (ref 13.0–17.0)
MCH: 27.2 pg (ref 26.0–34.0)
MCHC: 32.1 g/dL (ref 30.0–36.0)
MCV: 84.8 fL (ref 78.0–100.0)
PLATELETS: 326 10*3/uL (ref 150–400)
RBC: 3.49 MIL/uL — AB (ref 4.22–5.81)
RDW: 14 % (ref 11.5–15.5)
WBC: 9.1 10*3/uL (ref 4.0–10.5)

## 2015-10-09 LAB — RAPID URINE DRUG SCREEN, HOSP PERFORMED
AMPHETAMINES: NOT DETECTED
BARBITURATES: NOT DETECTED
Benzodiazepines: NOT DETECTED
COCAINE: NOT DETECTED
OPIATES: NOT DETECTED
TETRAHYDROCANNABINOL: NOT DETECTED

## 2015-10-09 LAB — CALCITRIOL (1,25 DI-OH VIT D): Vit D, 1,25-Dihydroxy: 70.2 pg/mL (ref 19.9–79.3)

## 2015-10-09 LAB — VITAMIN D 25 HYDROXY (VIT D DEFICIENCY, FRACTURES): VIT D 25 HYDROXY: 42.4 ng/mL (ref 30.0–100.0)

## 2015-10-09 SURGERY — IRRIGATION AND DEBRIDEMENT EXTREMITY
Anesthesia: General | Site: Leg Lower | Laterality: Left

## 2015-10-09 MED ORDER — SODIUM CHLORIDE 0.9 % IR SOLN
Status: DC | PRN
  Administered 2015-10-09: 3000 mL
  Administered 2015-10-09: 1000 mL
  Administered 2015-10-09: 3000 mL

## 2015-10-09 MED ORDER — LACTATED RINGERS IV SOLN
INTRAVENOUS | Status: DC | PRN
  Administered 2015-10-09 (×2): via INTRAVENOUS

## 2015-10-09 MED ORDER — PROMETHAZINE HCL 25 MG/ML IJ SOLN: 6.2500 mg | INTRAMUSCULAR | Status: DC | PRN

## 2015-10-09 MED ORDER — ONDANSETRON HCL 4 MG/2ML IJ SOLN
INTRAMUSCULAR | Status: DC | PRN
  Administered 2015-10-09: 4 mg via INTRAVENOUS

## 2015-10-09 MED ORDER — FENTANYL CITRATE (PF) 100 MCG/2ML IJ SOLN
INTRAMUSCULAR | Status: DC | PRN
  Administered 2015-10-09: 100 ug via INTRAVENOUS
  Administered 2015-10-09 (×6): 50 ug via INTRAVENOUS

## 2015-10-09 MED ORDER — PROPOFOL 10 MG/ML IV BOLUS
INTRAVENOUS | Status: AC
Start: 2015-10-09 — End: 2015-10-09
  Filled 2015-10-09: qty 20

## 2015-10-09 MED ORDER — PROPOFOL 10 MG/ML IV BOLUS
INTRAVENOUS | Status: DC | PRN
  Administered 2015-10-09: 25 mg via INTRAVENOUS
  Administered 2015-10-09: 50 mg via INTRAVENOUS
  Administered 2015-10-09: 100 mg via INTRAVENOUS

## 2015-10-09 MED ORDER — ROCURONIUM BROMIDE 100 MG/10ML IV SOLN
INTRAVENOUS | Status: DC | PRN
  Administered 2015-10-09 (×2): 10 mg via INTRAVENOUS
  Administered 2015-10-09: 50 mg via INTRAVENOUS

## 2015-10-09 MED ORDER — CEFAZOLIN SODIUM 1 G IJ SOLR
INTRAMUSCULAR | Status: AC
  Filled 2015-10-09: qty 20

## 2015-10-09 MED ORDER — FENTANYL CITRATE (PF) 100 MCG/2ML IJ SOLN
INTRAMUSCULAR | Status: AC
  Filled 2015-10-09: qty 4

## 2015-10-09 MED ORDER — LIDOCAINE HCL (CARDIAC) 20 MG/ML IV SOLN
INTRAVENOUS | Status: DC | PRN
  Administered 2015-10-09: 60 mg via INTRAVENOUS

## 2015-10-09 MED ORDER — FENTANYL CITRATE (PF) 100 MCG/2ML IJ SOLN
INTRAMUSCULAR | Status: AC
  Filled 2015-10-09: qty 2

## 2015-10-09 MED ORDER — MIDAZOLAM HCL 2 MG/2ML IJ SOLN
INTRAMUSCULAR | Status: AC
  Filled 2015-10-09: qty 2

## 2015-10-09 MED ORDER — SUGAMMADEX SODIUM 200 MG/2ML IV SOLN
INTRAVENOUS | Status: DC | PRN
  Administered 2015-10-09: 200 mg via INTRAVENOUS

## 2015-10-09 MED ORDER — LACTATED RINGERS IV SOLN: INTRAVENOUS | Status: DC

## 2015-10-09 MED ORDER — LORAZEPAM 1 MG PO TABS
1.0000 mg | ORAL_TABLET | Freq: Once | ORAL | Status: AC
  Administered 2015-10-09: 1 mg via ORAL
  Filled 2015-10-09: qty 1

## 2015-10-09 MED ORDER — HALOPERIDOL LACTATE 5 MG/ML IJ SOLN
5.0000 mg | Freq: Four times a day (QID) | INTRAMUSCULAR | Status: DC | PRN
  Administered 2015-10-09: 5 mg via INTRAVENOUS
  Filled 2015-10-09: qty 1

## 2015-10-09 MED ORDER — FENTANYL CITRATE (PF) 100 MCG/2ML IJ SOLN: 25.0000 ug | INTRAMUSCULAR | Status: DC | PRN

## 2015-10-09 SURGICAL SUPPLY — 55 items
BANDAGE ACE 3X5.8 VEL STRL LF (GAUZE/BANDAGES/DRESSINGS) ×3 IMPLANT
BAR GLASS FIBER EXFX 11X150 (EXFIX) ×3 IMPLANT
BAR GLASS FIBER EXFX 11X200 (EXFIX) ×3 IMPLANT
BLADE SURG CLIPPER 3M 9600 (MISCELLANEOUS) ×3 IMPLANT
BNDG GAUZE ELAST 4 BULKY (GAUZE/BANDAGES/DRESSINGS) ×3 IMPLANT
BRUSH SCRUB DISP (MISCELLANEOUS) ×6 IMPLANT
CANISTER WOUND CARE 500ML ATS (WOUND CARE) ×3 IMPLANT
CLAMP BLUE BAR TO BAR (EXFIX) ×6 IMPLANT
CLAMP BLUE BAR TO PIN (EXFIX) ×6 IMPLANT
COVER SURGICAL LIGHT HANDLE (MISCELLANEOUS) ×6 IMPLANT
DRAPE C-ARM 42X72 X-RAY (DRAPES) ×3 IMPLANT
DRAPE C-ARMOR (DRAPES) ×3 IMPLANT
DRAPE ORTHO SPLIT 77X108 STRL (DRAPES) ×1
DRAPE SURG ORHT 6 SPLT 77X108 (DRAPES) ×2 IMPLANT
DRAPE U-SHAPE 47X51 STRL (DRAPES) ×3 IMPLANT
DRSG ADAPTIC 3X8 NADH LF (GAUZE/BANDAGES/DRESSINGS) ×3 IMPLANT
DRSG VAC ATS MED SENSATRAC (GAUZE/BANDAGES/DRESSINGS) ×3 IMPLANT
DRSG VAC ATS SM SENSATRAC (GAUZE/BANDAGES/DRESSINGS) ×3 IMPLANT
ELECT REM PT RETURN 9FT ADLT (ELECTROSURGICAL)
ELECTRODE REM PT RTRN 9FT ADLT (ELECTROSURGICAL) IMPLANT
GAUZE SPONGE 4X4 12PLY STRL (GAUZE/BANDAGES/DRESSINGS) ×3 IMPLANT
GAUZE XEROFORM 5X9 LF (GAUZE/BANDAGES/DRESSINGS) ×3 IMPLANT
GLOVE BIO SURGEON STRL SZ 6.5 (GLOVE) ×3 IMPLANT
GLOVE BIO SURGEON STRL SZ7.5 (GLOVE) ×6 IMPLANT
GLOVE BIO SURGEON STRL SZ8 (GLOVE) ×3 IMPLANT
GLOVE BIO SURGEON STRL SZ8.5 (GLOVE) ×3 IMPLANT
GLOVE BIOGEL PI IND STRL 7.5 (GLOVE) ×2 IMPLANT
GLOVE BIOGEL PI IND STRL 8 (GLOVE) ×2 IMPLANT
GLOVE BIOGEL PI INDICATOR 7.5 (GLOVE) ×1
GLOVE BIOGEL PI INDICATOR 8 (GLOVE) ×1
GOWN STRL REUS W/ TWL LRG LVL3 (GOWN DISPOSABLE) ×4 IMPLANT
GOWN STRL REUS W/ TWL XL LVL3 (GOWN DISPOSABLE) ×2 IMPLANT
GOWN STRL REUS W/TWL LRG LVL3 (GOWN DISPOSABLE) ×2
GOWN STRL REUS W/TWL XL LVL3 (GOWN DISPOSABLE) ×1
HALF PIN 3MM (EXFIX) ×6 IMPLANT
HANDPIECE INTERPULSE COAX TIP (DISPOSABLE) ×1
KIT BASIN OR (CUSTOM PROCEDURE TRAY) ×3 IMPLANT
KIT ROOM TURNOVER OR (KITS) ×3 IMPLANT
MANIFOLD NEPTUNE II (INSTRUMENTS) ×3 IMPLANT
NS IRRIG 1000ML POUR BTL (IV SOLUTION) ×3 IMPLANT
PACK ORTHO EXTREMITY (CUSTOM PROCEDURE TRAY) ×3 IMPLANT
PAD ABD 8X10 STRL (GAUZE/BANDAGES/DRESSINGS) ×3 IMPLANT
PAD ARMBOARD 7.5X6 YLW CONV (MISCELLANEOUS) ×6 IMPLANT
PADDING CAST COTTON 6X4 STRL (CAST SUPPLIES) ×3 IMPLANT
RESTRAINT LIMB HOLDER UNIV (RESTRAINTS) ×3 IMPLANT
SET HNDPC FAN SPRY TIP SCT (DISPOSABLE) ×2 IMPLANT
SPONGE GAUZE 4X4 12PLY STER LF (GAUZE/BANDAGES/DRESSINGS) ×3 IMPLANT
SUT ETHILON 1 LR 30 (SUTURE) ×3 IMPLANT
SUT ETHILON 2 0 PSLX (SUTURE) ×6 IMPLANT
SUT PDS AB 2-0 CT1 27 (SUTURE) ×3 IMPLANT
TOWEL OR 17X24 6PK STRL BLUE (TOWEL DISPOSABLE) ×3 IMPLANT
TOWEL OR 17X26 10 PK STRL BLUE (TOWEL DISPOSABLE) ×6 IMPLANT
TUBE CONNECTING 12X1/4 (SUCTIONS) ×3 IMPLANT
UNDERPAD 30X30 (UNDERPADS AND DIAPERS) ×3 IMPLANT
YANKAUER SUCT BULB TIP NO VENT (SUCTIONS) ×6 IMPLANT

## 2015-10-09 NOTE — Anesthesia Postprocedure Evaluation (Signed)
Anesthesia Post Note  Patient: Damon Alexander  Procedure(s) Performed: Procedure(s) (LRB): IRRIGATION AND DEBRIDEMENT LEFT ANKLE POSSIBLE EX-FIX ADJUSTMENT (Left) APPLICATION OF WOUND VAC (Left)  Patient location during evaluation: PACU Anesthesia Type: General Level of consciousness: awake and alert Pain management: pain level controlled Vital Signs Assessment: post-procedure vital signs reviewed and stable Respiratory status: spontaneous breathing, nonlabored ventilation, respiratory function stable and patient connected to nasal cannula oxygen Cardiovascular status: blood pressure returned to baseline and stable Postop Assessment: no signs of nausea or vomiting Anesthetic complications: no    Last Vitals:  Vitals:   10/09/15 1145 10/09/15 1152  BP:  (!) 156/92  Pulse: (!) 106 (!) 112  Resp: 18 17  Temp:      Last Pain:  Vitals:   10/09/15 0537  TempSrc:   PainSc: 2                  Bonita Quinichard S Yer Castello

## 2015-10-09 NOTE — Brief Op Note (Addendum)
10/05/2015 - 10/09/2015  10:42 AM  PATIENT:  Damon Alexander  30 y.o. male  PRE-OPERATIVE DIAGNOSIS:   1. LEFT ANKLE PILON, TIBIA AND FIBULA, GRADE 3A OPEN 2. S/P EXTERNAL FIXATION left lower leg  POST-OPERATIVE DIAGNOSIS:   1. LEFT ANKLE PILON, TIBIA AND FIBULA, GRADE 3A OPEN 2. S/P EXTERNAL FIXATION left lower leg  PROCEDURE:  Procedure(s): 1. IRRIGATION AND DEBRIDEMENT LEFT TIBIA AND FIBULA GRADE 3A OPEN FRACTURE  2. OPEN REDUCTION LEFT ANKLE PILON, TIBIA ONLY 3. REVISION EXTERNAL FIXATION WITH NEW PINS AND BARS (Left) 4. APPLICATION OF SMALL WOUND VAC (Left)  SURGEON:  Surgeon(s) and Role:    * Myrene GalasMichael Wyllow Seigler, MD - Primary  PHYSICIAN ASSISTANT: Montez MoritaKEITH PAUL, PAC  ANESTHESIA:   general  EBL:  Total I/O In: 1450 [I.V.:1450] Out: 15 [Blood:15]  BLOOD ADMINISTERED:none  DRAINS: none   LOCAL MEDICATIONS USED:  NONE  SPECIMEN:  No Specimen  DISPOSITION OF SPECIMEN:  N/A  COUNTS:  YES  TOURNIQUET:  * No tourniquets in log *  DICTATION: .Other Dictation: Dictation Number [id number]  PLAN OF CARE: Admit to inpatient   PATIENT DISPOSITION:  PACU - hemodynamically stable.   Delay start of Pharmacological VTE agent (>24hrs) due to surgical blood loss or risk of bleeding: no

## 2015-10-09 NOTE — Transfer of Care (Signed)
Immediate Anesthesia Transfer of Care Note  Patient: Damon Alexander  Procedure(s) Performed: Procedure(s): IRRIGATION AND DEBRIDEMENT LEFT ANKLE POSSIBLE EX-FIX ADJUSTMENT (Left) APPLICATION OF WOUND VAC (Left)  Patient Location: PACU  Anesthesia Type:General  Level of Consciousness: awake, alert  and oriented  Airway & Oxygen Therapy: Patient Spontanous Breathing and Patient connected to nasal cannula oxygen  Post-op Assessment: Report given to RN, Post -op Vital signs reviewed and stable and Patient moving all extremities X 4  Post vital signs: Reviewed and stable  Last Vitals:  Vitals:   10/09/15 0119 10/09/15 0500  BP: 123/80 (!) 146/84  Pulse: (!) 102 100  Resp:  18  Temp:  37.5 C    Last Pain:  Vitals:   10/09/15 0537  TempSrc:   PainSc: 2       Patients Stated Pain Goal: 2 (10/09/15 0507)  Complications: No apparent anesthesia complications

## 2015-10-09 NOTE — Significant Event (Signed)
Assume over patient's care after patient is return to unit from PACU s/p surgery at 1225pm. Patient arrived to unit lethargic, slurred speech most times, oriented to self only. Patient did not arrived with restraints-RN removed the restraint order. Patient's significant other at bedside. VS stable. Left leg elevated, wound vac intact and functional.   Safety sitter requested and is here at the bedside at 1320pm.

## 2015-10-09 NOTE — Progress Notes (Signed)
Charge RN Fleet Contrasachel paged Dr. Magnus IvanBlackman for pt's increasing CIWA score to 16; gross visual and auditory hallucinations exhibited and increasing aggression;he removed his ace bandage which got tangled with his external fixator, and all the while trying to pull the metal pin out and stated 'the devil put that in there' referring to his fixator. MD ordered Haldol 5 mg IV Q6 hours PRN for agitation and a medical team consult was added to his care. Will continue to monitor.

## 2015-10-09 NOTE — Anesthesia Procedure Notes (Signed)
Procedure Name: Intubation Performed by: Bonita QuinGUIDETTI, Graylen Noboa S Pre-anesthesia Checklist: Patient identified, Emergency Drugs available, Suction available and Patient being monitored Patient Re-evaluated:Patient Re-evaluated prior to inductionOxygen Delivery Method: Circle system utilized Preoxygenation: Pre-oxygenation with 100% oxygen Intubation Type: IV induction Ventilation: Mask ventilation without difficulty Laryngoscope Size: Mac and 4 Grade View: Grade II Tube type: Oral Tube size: 7.5 mm Number of attempts: 1 Airway Equipment and Method: Stylet and Oral airway Placement Confirmation: ETT inserted through vocal cords under direct vision,  positive ETCO2 and breath sounds checked- equal and bilateral Tube secured with: Tape Dental Injury: Teeth and Oropharynx as per pre-operative assessment  Difficulty Due To: Difficult Airway- due to limited oral opening

## 2015-10-09 NOTE — Progress Notes (Signed)
Administered 5 mg Haldol IV at 0540 for pt's agitation; calmed down and finally went to sleep after an hour. Will continue to monitor.

## 2015-10-09 NOTE — Anesthesia Procedure Notes (Signed)
Procedure Name: Intubation Date/Time: 10/09/2015 8:54 AM Performed by: Carmela RimaMARTINELLI, Steffany Schoenfelder F Pre-anesthesia Checklist: Timeout performed, Patient being monitored, Suction available, Emergency Drugs available and Patient identified Patient Re-evaluated:Patient Re-evaluated prior to inductionOxygen Delivery Method: Circle system utilized Preoxygenation: Pre-oxygenation with 100% oxygen Intubation Type: IV induction Ventilation: Mask ventilation without difficulty and Oral airway inserted - appropriate to patient size Laryngoscope Size: Mac and 4 Grade View: Grade II Tube type: Oral Tube size: 7.5 mm Number of attempts: 2 Placement Confirmation: breath sounds checked- equal and bilateral,  positive ETCO2 and ETT inserted through vocal cords under direct vision Secured at: 23 cm Tube secured with: Tape Dental Injury: Teeth and Oropharynx as per pre-operative assessment and Injury to lip  Comments: Lips dry and cracked when mouth opened.

## 2015-10-09 NOTE — Progress Notes (Signed)
PT Cancellation Note  Patient Details Name: Damon Alexander MRN: [id number] DOB: 01-09-1986   Cancelled Treatment:    Reason Eval/Treat Not Completed: Patient at procedure or test/unavailable (Pt off unit for surgery will f/u tomorrow.  )   Florestine AversAimee J Theodore Virgin 10/09/2015, 11:09 AM Joycelyn RuaAimee Mccrae Speciale, PTA pager [phone]

## 2015-10-09 NOTE — Progress Notes (Signed)
Pt's hallucinations progressively grown worse; as per NTs, pt 'see and hear people by the window and around his room'; pt hops on one foot while moving around his room and down the hallways with a walker; Ativan 1 mg PO was given at 2305 as part of his CIWA protocol with a score of 10 but apparently non-effective; recent VS taken and were nonremarkable; Dr. Blackman paged and notified of recent events; ordered additional one time dose of 1 mg Ativan PO, safety sitter was requested, of which he agreed to order as well. Will continue to monitor.  
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
## 2015-10-09 NOTE — Anesthesia Preprocedure Evaluation (Addendum)
Anesthesia Evaluation  Patient identified by MRN, date of birth, ID band Patient awake    Reviewed: Allergy & Precautions, NPO status , Patient's Chart, lab work & pertinent test results  Airway Mallampati: II  TM Distance: >3 FB Neck ROM: Full   Comment: Patient is sedated, unable to examine fully Dental  (+) Dental Advidsory Given   Pulmonary Current Smoker,    Pulmonary exam normal        Cardiovascular Normal cardiovascular exam     Neuro/Psych Currently being treated for DT's    GI/Hepatic   Endo/Other    Renal/GU      Musculoskeletal   Abdominal   Peds  Hematology   Anesthesia Other Findings Spoke with significant other regarding history.  Patient very sedated.  Reproductive/Obstetrics                          Anesthesia Physical  Anesthesia Plan  ASA: II  Anesthesia Plan: General   Post-op Pain Management:    Induction: Intravenous  Airway Management Planned: LMA  Additional Equipment:   Intra-op Plan:   Post-operative Plan: Extubation in OR  Informed Consent: I have reviewed the patients History and Physical, chart, labs and discussed the procedure including the risks, benefits and alternatives for the proposed anesthesia with the patient or authorized representative who has indicated his/her understanding and acceptance.   Dental Advisory Given  Plan Discussed with: CRNA, Surgeon and Anesthesiologist  Anesthesia Plan Comments:        Anesthesia Quick Evaluation

## 2015-10-09 NOTE — Significant Event (Signed)
Patient's mother at the bedside and requested that patient not to have a sitter at bedside for tonight since she is spending the night. Current sitter Mohamen reported to RN that he called staffing office and requested them not to send sitter.

## 2015-10-10 ENCOUNTER — Encounter (HOSPITAL_COMMUNITY): Payer: Self-pay | Admitting: Orthopedic Surgery

## 2015-10-10 LAB — CBC
HCT: 26 % — ABNORMAL LOW (ref 39.0–52.0)
Hemoglobin: 8.5 g/dL — ABNORMAL LOW (ref 13.0–17.0)
MCH: 28.1 pg (ref 26.0–34.0)
MCHC: 32.7 g/dL (ref 30.0–36.0)
MCV: 85.8 fL (ref 78.0–100.0)
Platelets: 348 10*3/uL (ref 150–400)
RBC: 3.03 MIL/uL — ABNORMAL LOW (ref 4.22–5.81)
RDW: 14.5 % (ref 11.5–15.5)
WBC: 7 10*3/uL (ref 4.0–10.5)
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
## 2015-10-11 LAB — CBC
HCT: 26.2 % — ABNORMAL LOW (ref 39.0–52.0)
Hemoglobin: 8.5 g/dL — ABNORMAL LOW (ref 13.0–17.0)
MCH: 28.1 pg (ref 26.0–34.0)
MCHC: 32.4 g/dL (ref 30.0–36.0)
MCV: 86.8 fL (ref 78.0–100.0)
Platelets: 422 K/uL — ABNORMAL HIGH (ref 150–400)
RBC: 3.02 MIL/uL — ABNORMAL LOW (ref 4.22–5.81)
RDW: 14.2 % (ref 11.5–15.5)
WBC: 7.6 K/uL (ref 4.0–10.5)

## 2015-10-11 MED ORDER — POLYETHYLENE GLYCOL 3350 17 G PO PACK: 17.0000 g | PACK | Freq: Two times a day (BID) | ORAL | 0 refills | Status: DC

## 2015-10-11 MED ORDER — GABAPENTIN 600 MG PO TABS: 300.0000 mg | ORAL_TABLET | Freq: Two times a day (BID) | ORAL | 1 refills | Status: DC

## 2015-10-11 MED ORDER — GABAPENTIN 300 MG PO CAPS: 300.0000 mg | ORAL_CAPSULE | Freq: Two times a day (BID) | ORAL | 1 refills | Status: DC

## 2015-10-11 MED ORDER — DOCUSATE SODIUM 100 MG PO CAPS
100.0000 mg | ORAL_CAPSULE | Freq: Two times a day (BID) | ORAL | 0 refills | Status: DC
Start: 2015-10-11 — End: 2015-12-05

## 2015-10-11 MED ORDER — ASPIRIN EC 325 MG PO TBEC: 325.0000 mg | DELAYED_RELEASE_TABLET | Freq: Every day | ORAL | 0 refills | Status: DC

## 2015-10-11 MED ORDER — OXYCODONE-ACETAMINOPHEN 7.5-325 MG PO TABS: 1.0000 | ORAL_TABLET | Freq: Four times a day (QID) | ORAL | 0 refills | Status: DC | PRN

## 2015-10-11 NOTE — Clinical Social Work Note (Signed)
CSW received consult for "no PCP and no means to pay for meds"  RNCM consulted.  CSW signing off.  Vickii PennaGina Rinda Rollyson, LCSW [phone]  Licensed Clinical Social Worker

## 2015-10-11 NOTE — Discharge Planning (Addendum)
Patient IV removed.  Discharge papers given, explained and educated.  [facility] [place]  RN assessment and VS revealed stability for DC to home.  When ready, patient will be wheeled to front and family transporting home via car.    After clarification with patient.  Dr. Phoebe SharpsNote indicates Ds[facility]ge for tomorrow AM. Staff informed that patient will be staying tonight.
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
## 2015-10-11 NOTE — Progress Notes (Signed)
RN in to give give pt. scheduled meds and prn pain med and found pt. not in hospital bed, but on let out couch with girlfriend. Told pt. he needs to be careful with (L) leg- has fixator on it; pt. had disconnected himself from IV; told him he needs to call RN to disconnect for infection reasons.

## 2015-10-11 NOTE — Plan of Care (Signed)
Problem: Pain Managment: Goal: General experience of comfort will improve Outcome: Not Progressing Per pt. pain still intense and ? need more pain meds; when family/visitor here pt. takes less pain med.
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
## 2015-10-12 ENCOUNTER — Encounter (HOSPITAL_COMMUNITY): Payer: Self-pay | Admitting: Orthopedic Surgery

## 2015-10-12 ENCOUNTER — Encounter (HOSPITAL_COMMUNITY): Payer: Self-pay | Admitting: Emergency Medicine

## 2015-10-12 DIAGNOSIS — F17200 Nicotine dependence, unspecified, uncomplicated: Secondary | ICD-10-CM

## 2015-10-12 DIAGNOSIS — S82832C Other fracture of upper and lower end of left fibula, initial encounter for open fracture type IIIA, IIIB, or IIIC: Secondary | ICD-10-CM

## 2015-10-12 DIAGNOSIS — S82872C Displaced pilon fracture of left tibia, initial encounter for open fracture type IIIA, IIIB, or IIIC: Secondary | ICD-10-CM

## 2015-10-12 DIAGNOSIS — F1010 Alcohol abuse, uncomplicated: Secondary | ICD-10-CM | POA: Diagnosis present

## 2015-10-12 HISTORY — DX: Nicotine dependence, unspecified, uncomplicated: F17.200

## 2015-10-12 HISTORY — DX: Other fracture of upper and lower end of left fibula, initial encounter for open fracture type IIIA, IIIB, or IIIC: S82.832C

## 2015-10-12 HISTORY — DX: Alcohol abuse, uncomplicated: F10.10

## 2015-10-12 LAB — CBC
HCT: 27.4 % — ABNORMAL LOW (ref 39.0–52.0)
Hemoglobin: 8.6 g/dL — ABNORMAL LOW (ref 13.0–17.0)
MCH: 27.5 pg (ref 26.0–34.0)
MCHC: 31.4 g/dL (ref 30.0–36.0)
MCV: 87.5 fL (ref 78.0–100.0)
PLATELETS: 572 10*3/uL — AB (ref 150–400)
RBC: 3.13 MIL/uL — ABNORMAL LOW (ref 4.22–5.81)
RDW: 14.5 % (ref 11.5–15.5)
WBC: 7.4 10*3/uL (ref 4.0–10.5)

## 2015-10-12 LAB — CREATININE, SERUM
CREATININE: 0.86 mg/dL (ref 0.61–1.24)
GFR calc Af Amer: >60 mL/min (ref >60)
GFR calc non Af Amer: >60 mL/min (ref >60)
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
## 2015-10-12 NOTE — Care Management (Signed)
Case manager provided patient with information for [facility]. At this time they have no appointments. Advised patient that he should contact them at 9am on 10/15/15 to secure appointment for next week to establish medical care provider.

## 2015-10-12 NOTE — Progress Notes (Signed)
Reviewed discharge papers and medications with full understanding, reviewed the use of his crutches and reminded him several times to make dr appt on Monday with the clinic and Dr Carola FrostHandy

## 2015-10-12 NOTE — Discharge Instructions (Signed)
Orthopaedic Trauma Service Discharge Instructions   General Discharge Instructions  WEIGHT BEARING STATUS: Nonweightbearing Left lower extremity  RANGE OF MOTION/ACTIVITY: toe and knee range of motion as tolerated  Wound Care: see instructions below for external fixator pin care and wound care. Start wound care on 10/14/2015.    Discharge Pin Site Instructions  Dress pins daily with Kerlix roll starting on POD 2. Wrap the Kerlix so that it tamps the skin down around the pin-skin interface to prevent/limit motion of the skin relative to the pin.  (Pin-skin motion is the primary cause of pain and infection related to external fixator pin sites).  Remove any crust or coagulum that may obstruct drainage with a saline moistened gauze or soap and water.  After POD 3, if there is no discernable drainage on the pin site dressing, the interval for change can by increased to every other day.  You may shower with the fixator, cleaning all pin sites gently with soap and water.  If you have a surgical wound this needs to be completely dry and without drainage before showering.  The extremity can be lifted by the fixator to facilitate wound care and transfers.  Notify the office/Doctor if you experience increasing drainage, redness, or pain from a pin site, or if you notice purulent (thick, snot-like) drainage.  Discharge Wound Care Instructions  Do NOT apply any ointments, solutions or lotions to pin sites or surgical wounds.  These prevent needed drainage and even though solutions like hydrogen peroxide kill bacteria, they also damage cells lining the pin sites that help fight infection.  Applying lotions or ointments can keep the wounds moist and can cause them to breakdown and open up as well. This can increase the risk for infection. When in doubt call the office.  Surgical incisions should be dressed daily.  If any drainage is noted, use one layer of adaptic, then gauze, Kerlix, and an ace  wrap.  Once the incision is completely dry and without drainage, it may be left open to air out.  Showering may begin 36-48 hours later.  Cleaning gently with soap and water.  Traumatic wounds should be dressed daily as well.    One layer of adaptic, gauze, Kerlix, then ace wrap.  The adaptic can be discontinued once the draining has ceased    If you have a wet to dry dressing: wet the gauze with saline the squeeze as much saline out so the gauze is moist (not soaking wet), place moistened gauze over wound, then place a dry gauze over the moist one, followed by Kerlix wrap, then ace wrap.  PAIN MEDICATION USE AND EXPECTATIONS  You have likely been given narcotic medications to help control your pain.  After a traumatic event that results in an fracture (broken bone) with or without surgery, it is ok to use narcotic pain medications to help control one's pain.  We understand that everyone responds to pain differently and each individual patient will be evaluated on a regular basis for the continued need for narcotic medications. Ideally, narcotic medication use should last no more than 6-8 weeks (coinciding with fracture healing).   As a patient it is your responsibility as well to monitor narcotic medication use and report the amount and frequency you use these medications when you come to your office visit.   We would also advise that if you are using narcotic medications, you should take a dose prior to therapy to maximize you participation.  IF YOU ARE ON  NARCOTIC MEDICATIONS IT IS NOT PERMISSIBLE TO OPERATE A MOTOR VEHICLE (MOTORCYCLE/CAR/TRUCK/MOPED) OR HEAVY MACHINERY DO NOT MIX NARCOTICS WITH OTHER CNS (CENTRAL NERVOUS SYSTEM) DEPRESSANTS SUCH AS ALCOHOL  Diet: as you were eating previously.  Can use over the counter stool softeners and bowel preparations, such as Miralax, to help with bowel movements.  Narcotics can be constipating.  Be sure to drink plenty of fluids    STOP SMOKING OR  USING NICOTINE PRODUCTS!!!!  As discussed nicotine severely impairs your body's ability to heal surgical and traumatic wounds but also impairs bone healing.  Wounds and bone heal by forming microscopic blood vessels (angiogenesis) and nicotine is a vasoconstrictor (essentially, shrinks blood vessels).  Therefore, if vasoconstriction occurs to these microscopic blood vessels they essentially disappear and are unable to deliver necessary nutrients to the healing tissue.  This is one modifiable factor that you can do to dramatically increase your chances of healing your injury.    (This means no smoking, no nicotine gum, patches, etc)  DO NOT USE NONSTEROIDAL ANTI-INFLAMMATORY DRUGS (NSAID'S)  Using products such as Advil (ibuprofen), Aleve (naproxen), Motrin (ibuprofen) for additional pain control during fracture healing can delay and/or prevent the healing response.  If you would like to take over the counter (OTC) medication, Tylenol (acetaminophen) is ok.  However, some narcotic medications that are given for pain control contain acetaminophen as well. Therefore, you should not exceed more than 4000 mg of tylenol in a day if you do not have liver disease.  Also note that there are may OTC medicines, such as cold medicines and allergy medicines that my contain tylenol as well.  If you have any questions about medications and/or interactions please ask your doctor/PA or your pharmacist.      ICE AND ELEVATE INJURED/OPERATIVE EXTREMITY  Using ice and elevating the injured extremity above your heart can help with swelling and pain control.  Icing in a pulsatile fashion, such as 20 minutes on and 20 minutes off, can be followed.    Do not place ice directly on skin. Make sure there is a barrier between to skin and the ice pack.    Using frozen items such as frozen peas works well as the conform nicely to the are that needs to be iced.  USE AN ACE WRAP OR TED HOSE FOR SWELLING CONTROL  In addition to icing  and elevation, Ace wraps or TED hose are used to help limit and resolve swelling.  It is recommended to use Ace wraps or TED hose until you are informed to stop.    When using Ace Wraps start the wrapping distally (farthest away from the body) and wrap proximally (closer to the body)   Example: If you had surgery on your leg or thing and you do not have a splint on, start the ace wrap at the toes and work your way up to the thigh        If you had surgery on your upper extremity and do not have a splint on, start the ace wrap at your fingers and work your way up to the upper arm  IF YOU ARE IN A SPLINT OR CAST DO NOT REMOVE IT FOR ANY REASON   If your splint gets wet for any reason please contact the office immediately. You may shower in your splint or cast as long as you keep it dry.  This can be done by wrapping in a cast cover or garbage back (or similar)  Do Not  stick any thing down your splint or cast such as pencils, money, or hangers to try and scratch yourself with.  If you feel itchy take benadryl as prescribed on the bottle for itching  IF YOU ARE IN A CAM BOOT (BLACK BOOT)  You may remove boot periodically. Perform daily dressing changes as noted below.  Wash the liner of the boot regularly and wear a sock when wearing the boot. It is recommended that you sleep in the boot until told otherwise  CALL THE OFFICE WITH ANY QUESTIONS OR CONCERNS: [phone]
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
## 2015-10-12 NOTE — Progress Notes (Signed)
Orthopedic Tech Progress Note Patient Details:  Damon Alexander 03-Dec-1985 [id number]  Ortho Devices Type of Ortho Device: Crutches Ortho Device/Splint Interventions: Application   Nikki DomCrawford, Ashlay Altieri 10/12/2015, 11:01 AM Viewed order from doctor's order list

## 2015-10-15 ENCOUNTER — Encounter (HOSPITAL_COMMUNITY): Payer: Self-pay | Admitting: Orthopedic Surgery

## 2015-12-03 ENCOUNTER — Encounter (HOSPITAL_COMMUNITY): Payer: Self-pay | Admitting: *Deleted

## 2015-12-03 NOTE — Progress Notes (Signed)
Pt returned call for pre-op call. Pt denies cardiac history, chest pain or sob. Pt does smoke and he was instructed not to smoke as of now until after surgery.
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
## 2015-12-04 ENCOUNTER — Ambulatory Visit (HOSPITAL_COMMUNITY)
Admission: RE | Admit: 2015-12-04 | Discharge: 2015-12-05 | Disposition: A | Discharge status: Home / Self Care | Payer: Self-pay | Source: Ambulatory Visit | Attending: Orthopedic Surgery | Admitting: Orthopedic Surgery

## 2015-12-04 ENCOUNTER — Encounter (HOSPITAL_COMMUNITY): Payer: Self-pay | Admitting: Certified Registered"

## 2015-12-04 ENCOUNTER — Ambulatory Visit (HOSPITAL_COMMUNITY): Payer: Self-pay | Admitting: Anesthesiology

## 2015-12-04 ENCOUNTER — Ambulatory Visit (HOSPITAL_COMMUNITY): Payer: Self-pay

## 2015-12-04 ENCOUNTER — Encounter (HOSPITAL_COMMUNITY)
Admission: RE | Disposition: A | Discharge status: Home / Self Care | Payer: Self-pay | Source: Ambulatory Visit | Attending: Orthopedic Surgery

## 2015-12-04 DIAGNOSIS — M868X6 Other osteomyelitis, lower leg: Secondary | ICD-10-CM | POA: Insufficient documentation

## 2015-12-04 DIAGNOSIS — Y793 Surgical instruments, materials and orthopedic devices (including sutures) associated with adverse incidents: Secondary | ICD-10-CM | POA: Insufficient documentation

## 2015-12-04 DIAGNOSIS — F17210 Nicotine dependence, cigarettes, uncomplicated: Secondary | ICD-10-CM | POA: Insufficient documentation

## 2015-12-04 DIAGNOSIS — I10 Essential (primary) hypertension: Secondary | ICD-10-CM | POA: Insufficient documentation

## 2015-12-04 DIAGNOSIS — S82832C Other fracture of upper and lower end of left fibula, initial encounter for open fracture type IIIA, IIIB, or IIIC: Secondary | ICD-10-CM | POA: Diagnosis present

## 2015-12-04 DIAGNOSIS — T847XXA Infection and inflammatory reaction due to other internal orthopedic prosthetic devices, implants and grafts, initial encounter: Secondary | ICD-10-CM | POA: Insufficient documentation

## 2015-12-04 DIAGNOSIS — Z419 Encounter for procedure for purposes other than remedying health state, unspecified: Secondary | ICD-10-CM

## 2015-12-04 DIAGNOSIS — T8460XA Infection and inflammatory reaction due to internal fixation device of unspecified site, initial encounter: Secondary | ICD-10-CM

## 2015-12-04 DIAGNOSIS — S82873B Displaced pilon fracture of unspecified tibia, initial encounter for open fracture type I or II: Secondary | ICD-10-CM | POA: Insufficient documentation

## 2015-12-04 DIAGNOSIS — S82872C Displaced pilon fracture of left tibia, initial encounter for open fracture type IIIA, IIIB, or IIIC: Secondary | ICD-10-CM

## 2015-12-04 DIAGNOSIS — S82872F Displaced pilon fracture of left tibia, subsequent encounter for open fracture type IIIA, IIIB, or IIIC with routine healing: Secondary | ICD-10-CM | POA: Insufficient documentation

## 2015-12-04 DIAGNOSIS — F17200 Nicotine dependence, unspecified, uncomplicated: Secondary | ICD-10-CM | POA: Diagnosis present

## 2015-12-04 DIAGNOSIS — M85862 Other specified disorders of bone density and structure, left lower leg: Secondary | ICD-10-CM | POA: Insufficient documentation

## 2015-12-04 HISTORY — PX: EXTERNAL FIXATION REMOVAL: SHX5040

## 2015-12-04 HISTORY — DX: Essential (primary) hypertension: I10

## 2015-12-04 HISTORY — PX: SKIN SPLIT GRAFT: SHX444

## 2015-12-04 LAB — CBC
HEMATOCRIT: 34.7 % — AB (ref 39.0–52.0)
HEMATOCRIT: 33.4 % — AB (ref 39.0–52.0)
Hemoglobin: 11.4 g/dL — ABNORMAL LOW (ref 13.0–17.0)
Hemoglobin: 10.8 g/dL — ABNORMAL LOW (ref 13.0–17.0)
MCH: 26.6 pg (ref 26.0–34.0)
MCH: 26.4 pg (ref 26.0–34.0)
MCHC: 32.9 g/dL (ref 30.0–36.0)
MCHC: 32.3 g/dL (ref 30.0–36.0)
MCV: 81.1 fL (ref 78.0–100.0)
MCV: 81.7 fL (ref 78.0–100.0)
PLATELETS: 304 10*3/uL (ref 150–400)
PLATELETS: 271 10*3/uL (ref 150–400)
RBC: 4.28 MIL/uL (ref 4.22–5.81)
RBC: 4.09 MIL/uL — ABNORMAL LOW (ref 4.22–5.81)
RDW: 13.9 % (ref 11.5–15.5)
RDW: 13.9 % (ref 11.5–15.5)
WBC: 8.9 10*3/uL (ref 4.0–10.5)
WBC: 7.6 10*3/uL (ref 4.0–10.5)

## 2015-12-04 LAB — CREATININE, SERUM: Creatinine, Ser: 0.86 mg/dL (ref 0.61–1.24)

## 2015-12-04 SURGERY — REMOVAL, EXTERNAL FIXATION DEVICE, LOWER EXTREMITY
Anesthesia: General | Laterality: Left

## 2015-12-04 MED ORDER — LIDOCAINE 2% (20 MG/ML) 5 ML SYRINGE
INTRAMUSCULAR | Status: DC | PRN
  Administered 2015-12-04: 80 mg via INTRAVENOUS

## 2015-12-04 MED ORDER — PHENYLEPHRINE 40 MCG/ML (10ML) SYRINGE FOR IV PUSH (FOR BLOOD PRESSURE SUPPORT)
PREFILLED_SYRINGE | INTRAVENOUS | Status: AC
  Filled 2015-12-04: qty 10

## 2015-12-04 MED ORDER — POTASSIUM CHLORIDE IN NACL 20-0.9 MEQ/L-% IV SOLN
INTRAVENOUS | Status: DC
  Administered 2015-12-04: via INTRAVENOUS
  Filled 2015-12-04: qty 1000

## 2015-12-04 MED ORDER — ONDANSETRON HCL 4 MG PO TABS: 4.0000 mg | ORAL_TABLET | Freq: Four times a day (QID) | ORAL | Status: DC | PRN

## 2015-12-04 MED ORDER — FENTANYL CITRATE (PF) 100 MCG/2ML IJ SOLN
INTRAMUSCULAR | Status: AC
  Filled 2015-12-04: qty 2

## 2015-12-04 MED ORDER — HYDROMORPHONE HCL 2 MG/ML IJ SOLN
INTRAMUSCULAR | Status: AC
  Filled 2015-12-04: qty 1

## 2015-12-04 MED ORDER — MINERAL OIL LIGHT 100 % EX OIL
TOPICAL_OIL | CUTANEOUS | Status: DC | PRN
  Administered 2015-12-04: 1 via TOPICAL

## 2015-12-04 MED ORDER — ACETAMINOPHEN 650 MG RE SUPP: 650.0000 mg | Freq: Four times a day (QID) | RECTAL | Status: DC | PRN

## 2015-12-04 MED ORDER — PROPOFOL 10 MG/ML IV BOLUS
INTRAVENOUS | Status: AC
  Filled 2015-12-04: qty 40

## 2015-12-04 MED ORDER — HYDROMORPHONE HCL 1 MG/ML IJ SOLN
0.2500 mg | INTRAMUSCULAR | Status: DC | PRN
  Administered 2015-12-04 (×2): 0.5 mg via INTRAVENOUS

## 2015-12-04 MED ORDER — CEFAZOLIN IN D5W 1 GM/50ML IV SOLN
1.0000 g | Freq: Three times a day (TID) | INTRAVENOUS | Status: DC
  Administered 2015-12-04 – 2015-12-05 (×3): 1 g via INTRAVENOUS
  Filled 2015-12-04 (×7): qty 50

## 2015-12-04 MED ORDER — OXYCODONE HCL 5 MG PO TABS
5.0000 mg | ORAL_TABLET | Freq: Four times a day (QID) | ORAL | Status: DC | PRN
  Administered 2015-12-04 – 2015-12-05 (×4): 10 mg via ORAL
  Filled 2015-12-04 (×4): qty 2

## 2015-12-04 MED ORDER — OXYCODONE-ACETAMINOPHEN 5-325 MG PO TABS
1.0000 | ORAL_TABLET | Freq: Four times a day (QID) | ORAL | Status: DC | PRN
  Administered 2015-12-04 – 2015-12-05 (×3): 2 via ORAL
  Filled 2015-12-04 (×3): qty 2

## 2015-12-04 MED ORDER — KETOROLAC TROMETHAMINE 15 MG/ML IJ SOLN
15.0000 mg | Freq: Four times a day (QID) | INTRAMUSCULAR | Status: AC
  Administered 2015-12-04 – 2015-12-05 (×4): 15 mg via INTRAVENOUS
  Filled 2015-12-04 (×4): qty 1

## 2015-12-04 MED ORDER — ONDANSETRON HCL 4 MG/2ML IJ SOLN: 4.0000 mg | Freq: Four times a day (QID) | INTRAMUSCULAR | Status: DC | PRN

## 2015-12-04 MED ORDER — METOCLOPRAMIDE HCL 5 MG PO TABS: 5.0000 mg | ORAL_TABLET | Freq: Three times a day (TID) | ORAL | Status: DC | PRN

## 2015-12-04 MED ORDER — LACTATED RINGERS IV SOLN
INTRAVENOUS | Status: DC
  Administered 2015-12-04 (×2): via INTRAVENOUS

## 2015-12-04 MED ORDER — PROMETHAZINE HCL 25 MG/ML IJ SOLN: 6.2500 mg | INTRAMUSCULAR | Status: DC | PRN

## 2015-12-04 MED ORDER — CEFAZOLIN SODIUM-DEXTROSE 2-4 GM/100ML-% IV SOLN
INTRAVENOUS | Status: AC
  Filled 2015-12-04: qty 100

## 2015-12-04 MED ORDER — METOCLOPRAMIDE HCL 5 MG/ML IJ SOLN: 5.0000 mg | Freq: Three times a day (TID) | INTRAMUSCULAR | Status: DC | PRN

## 2015-12-04 MED ORDER — FENTANYL CITRATE (PF) 100 MCG/2ML IJ SOLN
INTRAMUSCULAR | Status: DC | PRN
  Administered 2015-12-04 (×4): 50 ug via INTRAVENOUS

## 2015-12-04 MED ORDER — MIDAZOLAM HCL 2 MG/2ML IJ SOLN
INTRAMUSCULAR | Status: AC
  Filled 2015-12-04: qty 2

## 2015-12-04 MED ORDER — ONDANSETRON HCL 4 MG/2ML IJ SOLN
INTRAMUSCULAR | Status: DC | PRN
  Administered 2015-12-04: 4 mg via INTRAVENOUS

## 2015-12-04 MED ORDER — SODIUM CHLORIDE 0.9 % IR SOLN
Status: DC | PRN
  Administered 2015-12-04: 1000 mL

## 2015-12-04 MED ORDER — ACETAMINOPHEN 325 MG PO TABS: 650.0000 mg | ORAL_TABLET | Freq: Four times a day (QID) | ORAL | Status: DC | PRN

## 2015-12-04 MED ORDER — HYDROMORPHONE HCL 2 MG/ML IJ SOLN
1.0000 mg | INTRAMUSCULAR | Status: DC | PRN
  Administered 2015-12-04 – 2015-12-05 (×2): 2 mg via INTRAVENOUS
  Filled 2015-12-04 (×2): qty 1

## 2015-12-04 MED ORDER — MINERAL OIL LIGHT 100 % EX OIL
TOPICAL_OIL | CUTANEOUS | Status: AC
  Filled 2015-12-04: qty 25

## 2015-12-04 MED ORDER — CHLORHEXIDINE GLUCONATE 4 % EX LIQD: 60.0000 mL | Freq: Once | CUTANEOUS | Status: DC

## 2015-12-04 MED ORDER — KETOROLAC TROMETHAMINE 15 MG/ML IJ SOLN
INTRAMUSCULAR | Status: AC
  Administered 2015-12-04: 15 mg
  Filled 2015-12-04: qty 1

## 2015-12-04 MED ORDER — BUPIVACAINE-EPINEPHRINE (PF) 0.5% -1:200000 IJ SOLN
INTRAMUSCULAR | Status: AC
  Filled 2015-12-04: qty 30

## 2015-12-04 MED ORDER — CEFAZOLIN SODIUM-DEXTROSE 2-4 GM/100ML-% IV SOLN
2.0000 g | INTRAVENOUS | Status: AC
  Administered 2015-12-04: 2 g via INTRAVENOUS

## 2015-12-04 MED ORDER — MIDAZOLAM HCL 5 MG/5ML IJ SOLN
INTRAMUSCULAR | Status: DC | PRN
  Administered 2015-12-04: 2 mg via INTRAVENOUS

## 2015-12-04 MED ORDER — OXYCODONE HCL 5 MG PO TABS
ORAL_TABLET | ORAL | Status: AC
  Filled 2015-12-04: qty 2

## 2015-12-04 MED ORDER — PROPOFOL 10 MG/ML IV BOLUS
INTRAVENOUS | Status: DC | PRN
  Administered 2015-12-04: 200 mg via INTRAVENOUS

## 2015-12-04 MED ORDER — ENOXAPARIN SODIUM 40 MG/0.4ML ~~LOC~~ SOLN
40.0000 mg | SUBCUTANEOUS | Status: DC
  Administered 2015-12-05: 40 mg via SUBCUTANEOUS
  Filled 2015-12-04: qty 0.4

## 2015-12-04 MED ORDER — PHENYLEPHRINE HCL 10 MG/ML IJ SOLN
INTRAMUSCULAR | Status: DC | PRN
  Administered 2015-12-04: 80 ug via INTRAVENOUS

## 2015-12-04 MED ORDER — BUPIVACAINE-EPINEPHRINE 0.5% -1:200000 IJ SOLN
INTRAMUSCULAR | Status: DC | PRN
  Administered 2015-12-04: 30 mL

## 2015-12-04 SURGICAL SUPPLY — 87 items
BANDAGE ACE 4X5 VEL STRL LF (GAUZE/BANDAGES/DRESSINGS) ×2 IMPLANT
BANDAGE ACE 6X5 VEL STRL LF (GAUZE/BANDAGES/DRESSINGS) ×2 IMPLANT
BLADE DERMATOME SS (BLADE) ×2 IMPLANT
BLADE SURG ROTATE 9660 (MISCELLANEOUS) IMPLANT
BNDG COHESIVE 4X5 TAN STRL (GAUZE/BANDAGES/DRESSINGS) IMPLANT
BNDG GAUZE ELAST 4 BULKY (GAUZE/BANDAGES/DRESSINGS) ×2 IMPLANT
BRUSH SCRUB DISP (MISCELLANEOUS) ×4 IMPLANT
CANISTER SUCTION 2500CC (MISCELLANEOUS) IMPLANT
COTTONBALL LRG STERILE PKG (GAUZE/BANDAGES/DRESSINGS) ×4 IMPLANT
COVER SURGICAL LIGHT HANDLE (MISCELLANEOUS) ×4 IMPLANT
CUFF TOURNIQUET SINGLE 18IN (TOURNIQUET CUFF) IMPLANT
CUFF TOURNIQUET SINGLE 24IN (TOURNIQUET CUFF) IMPLANT
CUFF TOURNIQUET SINGLE 34IN LL (TOURNIQUET CUFF) IMPLANT
DERMACARRIERS GRAFT 1 TO 1.5 (DISPOSABLE) ×2
DRAPE C-ARM 42X72 X-RAY (DRAPES) IMPLANT
DRAPE C-ARMOR (DRAPES) ×2 IMPLANT
DRAPE HALF SHEET 40X57 (DRAPES) IMPLANT
DRAPE PROXIMA HALF (DRAPES) ×6 IMPLANT
DRAPE U-SHAPE 47X51 STRL (DRAPES) ×2 IMPLANT
DRSG ADAPTIC 3X8 NADH LF (GAUZE/BANDAGES/DRESSINGS) ×2 IMPLANT
DRSG PAD ABDOMINAL 8X10 ST (GAUZE/BANDAGES/DRESSINGS) ×4 IMPLANT
DRSG VAC ATS SM SENSATRAC (GAUZE/BANDAGES/DRESSINGS) ×2 IMPLANT
ELECT REM PT RETURN 9FT ADLT (ELECTROSURGICAL) ×2
ELECTRODE REM PT RTRN 9FT ADLT (ELECTROSURGICAL) ×1 IMPLANT
EVACUATOR 1/8 PVC DRAIN (DRAIN) IMPLANT
GAUZE SPONGE 4X4 12PLY STRL (GAUZE/BANDAGES/DRESSINGS) ×4 IMPLANT
GAUZE SPONGE 4X4 16PLY XRAY LF (GAUZE/BANDAGES/DRESSINGS) ×2 IMPLANT
GAUZE XEROFORM 5X9 LF (GAUZE/BANDAGES/DRESSINGS) IMPLANT
GLOVE BIO SURGEON STRL SZ7.5 (GLOVE) ×2 IMPLANT
GLOVE BIO SURGEON STRL SZ8 (GLOVE) ×2 IMPLANT
GLOVE BIOGEL PI IND STRL 6.5 (GLOVE) ×1 IMPLANT
GLOVE BIOGEL PI IND STRL 7.5 (GLOVE) ×1 IMPLANT
GLOVE BIOGEL PI IND STRL 8 (GLOVE) ×1 IMPLANT
GLOVE BIOGEL PI INDICATOR 6.5 (GLOVE) ×1
GLOVE BIOGEL PI INDICATOR 7.5 (GLOVE) ×1
GLOVE BIOGEL PI INDICATOR 8 (GLOVE) ×1
GLOVE SURG SS PI 6.5 STRL IVOR (GLOVE) ×8 IMPLANT
GOWN STRL REUS W/ TWL LRG LVL3 (GOWN DISPOSABLE) ×2 IMPLANT
GOWN STRL REUS W/ TWL XL LVL3 (GOWN DISPOSABLE) ×1 IMPLANT
GOWN STRL REUS W/TWL 2XL LVL3 (GOWN DISPOSABLE) IMPLANT
GOWN STRL REUS W/TWL LRG LVL3 (GOWN DISPOSABLE) ×2
GOWN STRL REUS W/TWL XL LVL3 (GOWN DISPOSABLE) ×1
GRAFT DERMACARRIERS 1 TO 1.5 (DISPOSABLE) ×1 IMPLANT
HANDPIECE INTERPULSE COAX TIP (DISPOSABLE)
KIT BASIN OR (CUSTOM PROCEDURE TRAY) ×2 IMPLANT
KIT PREVENA INCISION MGT 13 (CANNISTER) ×2 IMPLANT
KIT ROOM TURNOVER OR (KITS) ×2 IMPLANT
MANIFOLD NEPTUNE II (INSTRUMENTS) ×2 IMPLANT
NEEDLE 22X1 1/2 (OR ONLY) (NEEDLE) IMPLANT
NS IRRIG 1000ML POUR BTL (IV SOLUTION) ×2 IMPLANT
PACK GENERAL/GYN (CUSTOM PROCEDURE TRAY) ×2 IMPLANT
PACK ORTHO EXTREMITY (CUSTOM PROCEDURE TRAY) ×2 IMPLANT
PAD ARMBOARD 7.5X6 YLW CONV (MISCELLANEOUS) ×4 IMPLANT
PAD CAST 4YDX4 CTTN HI CHSV (CAST SUPPLIES) ×1 IMPLANT
PADDING CAST COTTON 4X4 STRL (CAST SUPPLIES) ×1
PADDING CAST COTTON 6X4 STRL (CAST SUPPLIES) ×6 IMPLANT
PENCIL BUTTON HOLSTER BLD 10FT (ELECTRODE) ×2 IMPLANT
SET HNDPC FAN SPRY TIP SCT (DISPOSABLE) IMPLANT
SPLINT PLASTER CAST XFAST 5X30 (CAST SUPPLIES) ×1 IMPLANT
SPLINT PLASTER XFAST SET 5X30 (CAST SUPPLIES) ×1
SPONGE GAUZE 4X4 12PLY STER LF (GAUZE/BANDAGES/DRESSINGS) ×4 IMPLANT
SPONGE LAP 18X18 X RAY DECT (DISPOSABLE) ×6 IMPLANT
SPONGE SCRUB IODOPHOR (GAUZE/BANDAGES/DRESSINGS) ×2 IMPLANT
STAPLER VISISTAT (STAPLE) ×2 IMPLANT
STAPLER VISISTAT 35W (STAPLE) ×2 IMPLANT
STRIP CLOSURE SKIN 1/2X4 (GAUZE/BANDAGES/DRESSINGS) IMPLANT
SUCTION FRAZIER HANDLE 10FR (MISCELLANEOUS)
SUCTION TUBE FRAZIER 10FR DISP (MISCELLANEOUS) IMPLANT
SUT ETHILON 3 0 FSL (SUTURE) ×2 IMPLANT
SUT ETHILON 3 0 PS 1 (SUTURE) IMPLANT
SUT PDS AB 2-0 CT1 27 (SUTURE) IMPLANT
SUT PROLENE 5 0 PS 2 (SUTURE) ×4 IMPLANT
SUT VIC AB 0 CT1 27 (SUTURE)
SUT VIC AB 0 CT1 27XBRD ANBCTR (SUTURE) IMPLANT
SUT VIC AB 2-0 CT1 27 (SUTURE)
SUT VIC AB 2-0 CT1 TAPERPNT 27 (SUTURE) IMPLANT
SWAB COLLECTION DEVICE MRSA (MISCELLANEOUS) ×2 IMPLANT
SWAB CULTURE LIQ STUART DBL (MISCELLANEOUS) ×2 IMPLANT
SYR BULB IRRIGATION 50ML (SYRINGE) ×2 IMPLANT
SYR CONTROL 10ML LL (SYRINGE) IMPLANT
TAPE CLOTH SURG 6X10 WHT LF (GAUZE/BANDAGES/DRESSINGS) ×2 IMPLANT
TOWEL OR 17X24 6PK STRL BLUE (TOWEL DISPOSABLE) ×4 IMPLANT
TOWEL OR 17X26 10 PK STRL BLUE (TOWEL DISPOSABLE) ×4 IMPLANT
TUBE CONNECTING 12X1/4 (SUCTIONS) ×2 IMPLANT
UNDERPAD 30X30 (UNDERPADS AND DIAPERS) ×2 IMPLANT
WATER STERILE IRR 1000ML POUR (IV SOLUTION) ×4 IMPLANT
YANKAUER SUCT BULB TIP NO VENT (SUCTIONS) ×2 IMPLANT

## 2015-12-04 NOTE — Brief Op Note (Signed)
12/04/2015  10:12 AM  PATIENT:  Damon Alexander  30 y.o. male  PRE-OPERATIVE DIAGNOSIS:   1. GRADE 3B OPEN TIBIAL PILON, TIBIA AND FIBULA 2. RETAINED EXTERNAL FIXATOR RIGHT FOOT AND ANKLE 3. ULCERATED PIN SITES TIBIA AND FIBULA  POST-OPERATIVE DIAGNOSIS:   1. GRADE 3B OPEN TIBIAL PILON, TIBIA AND FIBULA 2. RETAINED EXTERNAL FIXATOR RIGHT FOOT AND ANKLE 3. ULCERATED PIN SITES TIBIA AND FIBULA 4. OSTEOMYELITIS TIBIA, PROXIMAL PIN TRACT  PROCEDURE:  Procedure(s): 1. REMOVAL EXTERNAL FIXATION LEFT LEG (Left) 2. SKIN GRAFT SPLIT THICKNESS LEFT LEG (Left) 5cm x 4cm (20cm2) 3. PARTIAL EXCISION TIBIA OSTEOMYELITIS TRACT 4. CURETTAGE ULCERATED PIN SITES 5. APPLICATION OF WOUND VAC, SMALL  SURGEON:  Surgeon(s) and Role:    * Myrene GalasMichael Anders Hohmann, MD - Primary  PHYSICIAN ASSISTANT: Montez MoritaKeith Paul, [facility]  ANESTHESIA:   general  EBL:  Total I/O In: 1500 [I.V.:1500] Out: 50 [Blood:50]  BLOOD ADMINISTERED:none  DRAINS: wound vac   LOCAL MEDICATIONS USED:  MARCAINE     SPECIMEN:  Source of Specimen:  tibial pin site  DISPOSITION OF SPECIMEN:  mciro  COUNTS:  YES  TOURNIQUET:  * No tourniquets in log *  DICTATION: .Other Dictation: Dictation Number [id number]  PLAN OF CARE: Admit for overnight observation  PATIENT DISPOSITION:  PACU - hemodynamically stable.   Delay start of Pharmacological VTE agent (>24hrs) due to surgical blood loss or risk of bleeding: yes

## 2015-12-04 NOTE — Anesthesia Procedure Notes (Signed)
Procedure Name: LMA Insertion Date/Time: 12/04/2015 8:13 AM Performed by: Arlice ColtMANESS, Shinita Mac B Pre-anesthesia Checklist: Patient identified, Emergency Drugs available, Suction available, Patient being monitored and Timeout performed Patient Re-evaluated:Patient Re-evaluated prior to inductionOxygen Delivery Method: Circle system utilized Preoxygenation: Pre-oxygenation with 100% oxygen Intubation Type: IV induction LMA: LMA inserted LMA Size: 4.0 Number of attempts: 1 Placement Confirmation: positive ETCO2 and breath sounds checked- equal and bilateral Tube secured with: Tape Dental Injury: Teeth and Oropharynx as per pre-operative assessment

## 2015-12-04 NOTE — Anesthesia Postprocedure Evaluation (Signed)
Anesthesia Post Note  Patient: Damon Alexander  Procedure(s) Performed: Procedure(s) (LRB): REMOVAL EXTERNAL FIXATION LEFT LEG (Left) SKIN GRAFT SPLIT THICKNESS LEFT LEG (Left)  Patient location during evaluation: PACU Anesthesia Type: General Level of consciousness: awake and alert Pain management: pain level controlled Vital Signs Assessment: post-procedure vital signs reviewed and stable Respiratory status: spontaneous breathing, nonlabored ventilation, respiratory function stable and patient connected to nasal cannula oxygen Cardiovascular status: blood pressure returned to baseline and stable Postop Assessment: no signs of nausea or vomiting Anesthetic complications: no    Last Vitals:  Vitals:   12/04/15 1054 12/04/15 1115  BP:  (!) 146/94  Pulse: 96 83  Resp:  16  Temp: 36.3 C 36.6 C    Last Pain:  Vitals:   12/04/15 1200  TempSrc:   PainSc: 4                  Qasim Diveley,JAMES TERRILL

## 2015-12-04 NOTE — Anesthesia Preprocedure Evaluation (Addendum)
Anesthesia Evaluation  Patient identified by MRN, date of birth, ID band Patient awake    Reviewed: Allergy & Precautions, NPO status , Patient's Chart, lab work & pertinent test results  Airway Mallampati: II  TM Distance: >3 FB Neck ROM: Full   Comment: Patient is sedated, unable to examine fully Dental  (+) Dental Advidsory Given, Teeth Intact, Poor Dentition   Pulmonary Current Smoker,    Pulmonary exam normal breath sounds clear to auscultation       Cardiovascular Normal cardiovascular exam Rhythm:Regular Rate:Normal     Neuro/Psych Currently being treated for DT's    GI/Hepatic   Endo/Other    Renal/GU      Musculoskeletal   Abdominal   Peds  Hematology   Anesthesia Other Findings Spoke with significant other regarding history.    Reproductive/Obstetrics                            Anesthesia Physical  Anesthesia Plan  ASA: II  Anesthesia Plan: General   Post-op Pain Management:    Induction: Intravenous  Airway Management Planned: LMA  Additional Equipment:   Intra-op Plan:   Post-operative Plan: Extubation in OR  Informed Consent: I have reviewed the patients History and Physical, chart, labs and discussed the procedure including the risks, benefits and alternatives for the proposed anesthesia with the patient or authorized representative who has indicated his/her understanding and acceptance.   Dental Advisory Given  Plan Discussed with: CRNA, Surgeon and Anesthesiologist  Anesthesia Plan Comments:         Anesthesia Quick Evaluation

## 2015-12-04 NOTE — Transfer of Care (Signed)
Immediate Anesthesia Transfer of Care Note  Patient: Damon Alexander  Procedure(s) Performed: Procedure(s): REMOVAL EXTERNAL FIXATION LEFT LEG (Left) SKIN GRAFT SPLIT THICKNESS LEFT LEG (Left)  Patient Location: PACU  Anesthesia Type:General  Level of Consciousness: responds to stimulation  Airway & Oxygen Therapy: Patient Spontanous Breathing  Post-op Assessment: Report given to RN and Post -op Vital signs reviewed and stable  Post vital signs: Reviewed and stable  Last Vitals:  Vitals:   12/04/15 0652  BP: 138/80  Pulse: 80  Resp: 20  Temp: 36.7 C    Last Pain:  Vitals:   12/04/15 0728  TempSrc:   PainSc: 5       Patients Stated Pain Goal: 5 (12/04/15 0728)  Complications: No apparent anesthesia complications

## 2015-12-04 NOTE — Progress Notes (Signed)
Pt has been resting quietly in bed since arrival to unit. No distress noted. Medicated for pain as requested. Call bell at side. Prevena wound vac in use to left leg surgical wound.  Pt voiding without difficulty.

## 2015-12-05 ENCOUNTER — Encounter (HOSPITAL_COMMUNITY): Payer: Self-pay | Admitting: Orthopedic Surgery

## 2015-12-05 DIAGNOSIS — T8460XA Infection and inflammatory reaction due to internal fixation device of unspecified site, initial encounter: Secondary | ICD-10-CM

## 2015-12-05 DIAGNOSIS — I10 Essential (primary) hypertension: Secondary | ICD-10-CM

## 2015-12-05 HISTORY — DX: Essential (primary) hypertension: I10

## 2015-12-05 HISTORY — DX: Infection and inflammatory reaction due to internal fixation device of unspecified site, initial encounter: T84.60XA

## 2015-12-05 MED ORDER — OXYCODONE-ACETAMINOPHEN 5-325 MG PO TABS: 1.0000 | ORAL_TABLET | Freq: Three times a day (TID) | ORAL | 0 refills | Status: DC | PRN

## 2015-12-05 MED ORDER — DOXYCYCLINE HYCLATE 100 MG PO CAPS: 100.0000 mg | ORAL_CAPSULE | Freq: Two times a day (BID) | ORAL | 0 refills | Status: DC

## 2015-12-05 MED ORDER — CIPROFLOXACIN HCL 500 MG PO TABS: 500.0000 mg | ORAL_TABLET | Freq: Two times a day (BID) | ORAL | 0 refills | Status: DC

## 2015-12-05 NOTE — Op Note (Signed)
NA[facility]:  Alexander, Damon              ACCOUNT NO.:  [id number]  MEDICAL RECORD NO.:  [id number]  LOCATION:  5N20C                        FACILITY:  [facility]  PHYSICIAN:  Doralee AlbinoMichael H. Carola FrostHandy, M.D. DATE OF BIRTH:  06-Nov-1985  DATE OF PROCEDURE:  12/04/2015 DATE OF DISCHARGE:                              OPERATIVE REPORT   PREOPERATIVE DIAGNOSES: 1. Grade 3B open left tibial pilon with comminuted fibular fracture     and articular bone loss. 2. Retained external fixator. 3. Ulcerated pin sites.  POSTOPERATIVE DIAGNOSES: 1. Grade 3B open left tibial pilon with comminuted fibular fracture     and articular bone loss. 2. Retained external fixator. 3. Ulcerated pin sites. 4. Osteomyelitis tibia, left  PROCEDURES: 1. Removal of external fixator under anesthesia. 2. Split-thickness skin grafting, left leg, 5 cm x 4 cm (20 cm2). 3. Curettage and debridement of ulcerated pin sites calcaneus,     and metatarsals. 4. Partial excision tibia. 5. Application of small wound VAC. 6. Stress flouro of fracture sites.  SURGEON:  Doralee AlbinoMichael H. Carola FrostHandy, M.D.  ASSISTANT:  Montez MoritaKeith Paul, PA-C.  ANESTHESIA:  General.  COMPLICATIONS:  None.  SPECIMENS:  Two tibial deep pin sites sent to Micro.  DISPOSITION:  PACU.  CONDITION:  Stable.  BRIEF SUMMARY OF INDICATIONS FOR PROCEDURE:  Damon Alexander is a 30 year old male status post grade 3B open tibial pilon and comminuted fibular fracture treated in prolonged external fixation.  The patient has weathered many wound issues and pin tract infections and now presents for split-thickness skin grafting at the same time as removal of his external fixator.  I did discuss with him the risks and benefits of surgery including the potential of deep infection that could result in a limb loss, nonunion, failure of the graft, and many others including graft site harvest complications.  The patient acknowledged these risks as did his significant other and they did strongly  wish to proceed.  BRIEF SUMMARY OF PROCEDURE:  The patient was taken to the operating room where general anesthesia was induced.  He did receive 2 g of Ancef preoperatively.  Began with removal of his external fixator and then aggressive chlorhexidine scrub of all of his pin sites and foot as well as a scrub of his wound.  Following this, we recognized spontaneous drainage from his tibial pin sites. The more proximal of the pins had purulent material as well as an even more proximal tibia area where a pin had been attempted by previous surgeon so this was cultured.  A standard prep and drape was then performed and aggressive curettage performed of the skin, subcutaneous tissue, fascia, and bone of the pin sites in the calcaneus and metatarsals.  Following this, we then used additional curettes and a more extensile effort and procedure to more aggressively excise the entire bone pin tracts within the tibia medullary canal and cortices to partial excise this area of infected bone.  These were intermittently irrigated and then following all of the debridement and excision, copiously irrigated with saline.  This was completed on the tibial pins.  In the calcaneus a thorough irrigation from medially to laterally and then both of the metatarsal pin sites was performed  as well.  Attention then turned to the wound where it was measured and a 2 inch dermatome used to harvest.  A graft was then applied to the recipient site and secured in place with a Prolene suture.  The harvest site was treated with Marcaine and epinephrine-soaked sponges followed by Xeroform gauze, an ABD, and then Ace wrap in on the recipient site Mepitel, followed by application of Prevena wound VAC to increase the odds of complete take.  Again, this area measured approximately 5 x 4 cm.  Sterile gently compressive dressing was then applied and a posterior and stirrup splint.    Prior to application of the splint, C arm was  brought in and a stress evaluation performed under live flouro showing no fracture site motion of the tibia or fibula. The patient was taken to PACU in stable condition.  PROGNOSIS:  The patient will be on touchdown weightbearing on the left lower extremity.  On return to the office, we anticipate transition to a Cam boot.  His harvest site dressing will be removed tomorrow and a fan applied to dry this out and maintained Xeroform layer.     Doralee AlbinoMichael H. Carola FrostHandy, M.D.     M[facility]/MEDQ  D:  12/04/2015  T:  12/05/2015  Job:  [id number]

## 2015-12-05 NOTE — Care Management Note (Signed)
Case Management Note  Patient Details  Name: Damon BeckSamuel D Alexander MRN: [id number] Date of Birth: 1985/10/28  Subjective/Objective:   30 yr old male s/p left leg skin grafting with wound vac application.                  Action/Plan: Case manager spoke with patient concerning need to arrange for wound vac for home. CM faxed orders to [employer], [employer]. VAC will not need to be changed by Home Health, per Charlyne PetrinKeith Paul,PA., Patient has an appointment on Monday 12/10/15 and VAC will be removed in the office.    Expected Discharge Date:    12/05/15              Expected Discharge Plan:  Home/Self Care  In-House Referral:     Discharge planning Services  CM Consult  Post Acute Care Choice:  Durable Medical Equipment Choice offered to:  NA  DME Arranged:  Vac DME Agency:  [facility]  HH Arranged:  NA HH Agency:  NA  Status of Service:  Completed, signed off  If discussed at Long Length of Stay Meetings, dates discussed:    Additional Comments:  Damon Alexander, Damon Halbleib Naomi, RN 12/05/2015, 2:55 PM

## 2015-12-05 NOTE — Evaluation (Signed)
Physical Therapy Evaluation Patient Details Name: Damon BeckSamuel D Alexander MRN: [id number] DOB: [birth year] Today's Date: 12/05/2015   History of Present Illness  30 y.o. male admitted for removal of L tibia external fixator, skin graft, wound VAC. PMH of L open tib fib fx with external fixator placement 10/05/15, RLE fx with IM nail 2014.   Clinical Impression  Pt is independent with mobility with RW. He ambulated 150' with RW, no loss of balance. Instructed pt in L ankle AROM exercises for HEP. From PT standpoint he is ready to DC home.     Follow Up Recommendations No PT follow up    Equipment Recommendations  None recommended by PT    Recommendations for Other Services       Precautions / Restrictions Precautions Precautions: Fall Restrictions Weight Bearing Restrictions: Yes LLE Weight Bearing: Non weight bearing      Mobility  Bed Mobility Overal bed mobility: Independent                Transfers Overall transfer level: Independent Equipment used: Rolling walker (2 wheeled)                Ambulation/Gait Ambulation/Gait assistance: Modified independent (Device/Increase time) Ambulation Distance (Feet): 150 Feet Assistive device: Rolling walker (2 wheeled) Gait Pattern/deviations: Step-to pattern   Gait velocity interpretation: at or above normal speed for age/gender General Gait Details: NWB LLE, steady with RW, no LOB  Stairs Stairs: Yes       General stair comments: pt declined stair training, he reports he's independent with stairs  Wheelchair Mobility    Modified Rankin (Stroke Patients Only)       Balance Overall balance assessment: Modified Independent                                           Pertinent Vitals/Pain Pain Assessment: 0-10 Pain Score: 6  Pain Location: LLE Pain Descriptors / Indicators: Sore Pain Intervention(s): Limited activity within patient's tolerance;Premedicated before session;Monitored during  session    Home Living Family/patient expects to be discharged to:: Private residence Living Arrangements: Spouse/significant other Available Help at Discharge: Family;Friend(s);Available PRN/intermittently Type of Home: House Home Access: Stairs to enter Entrance Stairs-Rails: None Entrance Stairs-Number of Steps: 5 Home Layout: One level Home Equipment: Walker - 2 wheels;Shower seat;Wheelchair - Careers advisermanual;Other (comment);Crutches (knee scooter)      Prior Function Level of Independence: Independent with assistive device(s)         Comments: used crutches      Hand Dominance        Extremity/Trunk Assessment   Upper Extremity Assessment: Overall WFL for tasks assessed           Lower Extremity Assessment: LLE deficits/detail   LLE Deficits / Details: ankle DF 5* AROM, PF 15* AROM, inversion/eversion limited by bulky dressing,  knee ext WNL  Cervical / Trunk Assessment: Normal  Communication   Communication: No difficulties  Cognition Arousal/Alertness: Awake/alert Behavior During Therapy: WFL for tasks assessed/performed Overall Cognitive Status: Within Functional Limits for tasks assessed                      General Comments      Exercises General Exercises - Lower Extremity Ankle Circles/Pumps: AROM;Left;10 reps;Seated   Assessment/Plan    PT Assessment Patent does not need any further PT services  PT Problem List  PT Treatment Interventions      PT Goals (Current goals can be found in the Care Plan section)  Acute Rehab PT Goals 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 PT Goal Formulation: All assessment and education complete, DC therapy    Frequency     Barriers to discharge        Co-evaluation               End of Session Equipment Utilized During Treatment: Gait belt Activity Tolerance: Patient tolerated treatment well Patient left: in bed;with call bell/phone within reach Nurse  Communication: Mobility status    Functional Assessment Tool Used: clinical judgement Functional Limitation: Mobility: Walking and moving around Mobility: Walking and Moving Around Current Status ([phone]): At least 1 percent but less than 20 percent impaired, limited or restricted Mobility: Walking and Moving Around Goal Status [phone]): At least 1 percent but less than 20 percent impaired, limited or restricted Mobility: Walking and Moving Around Discharge Status [phone]): At least 1 percent but less than 20 percent impaired, limited or restricted    Time: [id number] PT Time Calculation (min) (ACUTE ONLY): 16 min   Charges:   PT Evaluation $PT Eval Low Complexity: 1 Procedure     PT G Codes:   PT G-Codes **NOT FOR INPATIENT CLASS** Functional Assessment Tool Used: clinical judgement Functional Limitation: Mobility: Walking and moving around Mobility: Walking and Moving Around Current Status ([phone]): At least 1 percent but less than 20 percent impaired, limited or restricted Mobility: Walking and Moving Around Goal Status [phone]): At least 1 percent but less than 20 percent impaired, limited or restricted Mobility: Walking and Moving Around Discharge Status [phone]): At least 1 percent but less than 20 percent impaired, limited or restricted    Tamala SerUhlenberg, Joell Buerger Kistler 12/05/2015, 11:26 AM [phone]

## 2015-12-05 NOTE — Discharge Instructions (Signed)
Orthopaedic Trauma Service Discharge Instructions   General Discharge Instructions  WEIGHT BEARING STATUS: Nonweightbearing left leg  RANGE OF MOTION/ACTIVITY: Unrestricted range of motion left ankle and knee  Wound Care:  Donor site instructions (left thigh)  DO NOT remove yellow xeroform layer for any reason  Pt to dab blood droplets as they form  Fan to xeroform layer to dry out site/sites DO NOT cover donor sites with any dressings, allow them to stay  exposed to air  Gently trim edges as they roll up  If wound still has some wet spots it is okay to cover with a gauze overnight. Remove gauze first thing in the morning so it can air out. Once dressing/wound are completely dry and leave open to air and trim up Xeroform edges as needed  Do not remove dressing to left ankle. We will remove this at her first follow-up visit on 12/10/2015 which is scheduled for 10 AM  PAIN MEDICATION USE AND EXPECTATIONS  You have likely been given narcotic medications to help control your pain.  After a traumatic event that results in an fracture (broken bone) with or without surgery, it is ok to use narcotic pain medications to help control one's pain.  We understand that everyone responds to pain differently and each individual patient will be evaluated on a regular basis for the continued need for narcotic medications. Ideally, narcotic medication use should last no more than 6-8 weeks (coinciding with fracture healing).   As a patient it is your responsibility as well to monitor narcotic medication use and report the amount and frequency you use these medications when you come to your office visit.   We would also advise that if you are using narcotic medications, you should take a dose prior to therapy to maximize you participation.  IF YOU ARE ON NARCOTIC MEDICATIONS IT IS NOT PERMISSIBLE TO OPERATE A MOTOR VEHICLE (MOTORCYCLE/CAR/TRUCK/MOPED) OR HEAVY MACHINERY DO NOT MIX NARCOTICS WITH OTHER CNS  (CENTRAL NERVOUS SYSTEM) DEPRESSANTS SUCH AS ALCOHOL  Diet: as you were eating previously.  Can use over the counter stool softeners and bowel preparations, such as Miralax, to help with bowel movements.  Narcotics can be constipating.  Be sure to drink plenty of fluids    STOP SMOKING OR USING NICOTINE PRODUCTS!!!!  As discussed nicotine severely impairs your body's ability to heal surgical and traumatic wounds but also impairs bone healing.  Wounds and bone heal by forming microscopic blood vessels (angiogenesis) and nicotine is a vasoconstrictor (essentially, shrinks blood vessels).  Therefore, if vasoconstriction occurs to these microscopic blood vessels they essentially disappear and are unable to deliver necessary nutrients to the healing tissue.  This is one modifiable factor that you can do to dramatically increase your chances of healing your injury.    (This means no smoking, no nicotine gum, patches, etc)  DO NOT USE NONSTEROIDAL ANTI-INFLAMMATORY DRUGS (NSAID'S)  Using products such as Advil (ibuprofen), Aleve (naproxen), Motrin (ibuprofen) for additional pain control during fracture healing can delay and/or prevent the healing response.  If you would like to take over the counter (OTC) medication, Tylenol (acetaminophen) is ok.  However, some narcotic medications that are given for pain control contain acetaminophen as well. Therefore, you should not exceed more than 4000 mg of tylenol in a day if you do not have liver disease.  Also note that there are may OTC medicines, such as cold medicines and allergy medicines that my contain tylenol as well.  If you have any questions  about medications and/or interactions please ask your doctor/PA or your pharmacist.      ICE AND ELEVATE INJURED/OPERATIVE EXTREMITY  Using ice and elevating the injured extremity above your heart can help with swelling and pain control.  Icing in a pulsatile fashion, such as 20 minutes on and 20 minutes off, can be  followed.    Do not place ice directly on skin. Make sure there is a barrier between to skin and the ice pack.    Using frozen items such as frozen peas works well as the conform nicely to the are that needs to be iced.  USE AN ACE WRAP OR TED HOSE FOR SWELLING CONTROL  In addition to icing and elevation, Ace wraps or TED hose are used to help limit and resolve swelling.  It is recommended to use Ace wraps or TED hose until you are informed to stop.    When using Ace Wraps start the wrapping distally (farthest away from the body) and wrap proximally (closer to the body)   Example: If you had surgery on your leg or thing and you do not have a splint on, start the ace wrap at the toes and work your way up to the thigh        If you had surgery on your upper extremity and do not have a splint on, start the ace wrap at your fingers and work your way up to the upper arm  IF YOU ARE IN A SPLINT OR CAST DO NOT REMOVE IT FOR ANY REASON   If your splint gets wet for any reason please contact the office immediately. You may shower in your splint or cast as long as you keep it dry.  This can be done by wrapping in a cast cover or garbage back (or similar)  Do Not stick any thing down your splint or cast such as pencils, money, or hangers to try and scratch yourself with.  If you feel itchy take benadryl as prescribed on the bottle for itching  IF YOU ARE IN A CAM BOOT (BLACK BOOT)  You may remove boot periodically. Perform daily dressing changes as noted below.  Wash the liner of the boot regularly and wear a sock when wearing the boot. It is recommended that you sleep in the boot until told otherwise  CALL THE OFFICE WITH ANY QUESTIONS OR CONCERNS: [phone]

## 2015-12-05 NOTE — Evaluation (Signed)
Occupational Therapy Evaluation/Discharge Patient Details Name: Damon BeckSamuel D Alexander MRN: [id number] DOB: 07-27-1985 Today's Date: 12/05/2015    History of Present Illness 30 y.o. male admitted for removal of L tibia external fixator, skin graft, wound VAC. PMH of L open tib fib fx with external fixator placement 10/05/15, RLE fx with IM nail 2014.    Clinical Impression   PTA, pt was independent with assistive devices (crutches) for ADL and IADL. Pt currently requires supervision with all ADL and functional mobility to maintain safety. Completed education concerning safety with ADL post-acute D/C and pt verbalizes and demonstrates understanding. No OT follow-up necessary and recommend intermittent supervision from girlfriend post-acute D/C. Pt has no further acute OT needs. OT will sign off.    Follow Up Recommendations  No OT follow up;Supervision - Intermittent    Equipment Recommendations  None recommended by OT       Precautions / Restrictions Precautions Precautions: Fall Restrictions Weight Bearing Restrictions: Yes LLE Weight Bearing: Non weight bearing      Mobility Bed Mobility Overal bed mobility: Independent                Transfers Overall transfer level: Needs assistance Equipment used: Rolling walker (2 wheeled) Transfers: Sit to/from Stand Sit to Stand: Supervision              Balance Overall balance assessment: Modified Independent                                          ADL Overall ADL's : Needs assistance/impaired     Grooming: Oral care;Standing;Supervision/safety   Upper Body Bathing: Set up;Sitting   Lower Body Bathing: Sit to/from stand;Supervison/ safety   Upper Body Dressing : Set up;Sitting   Lower Body Dressing: Sit to/from stand;Supervision/safety   Toilet Transfer: Ambulation;RW;BSC;Supervision/safety   Toileting- Clothing Manipulation and Hygiene: Sit to/from stand;Supervision/safety   Tub/ Shower  Transfer: Ambulation;Shower seat;Rolling walker;Supervision/safety   Functional mobility during ADLs: Rolling walker;Supervision/safety General ADL Comments: Educated pt on energy conservation, safety with ADL while maintaining LLE NWB status, and safe use of DME.     Vision Vision Assessment?: No apparent visual deficits          Pertinent Vitals/Pain Pain Assessment: 0-10 Pain Score: 7  Pain Location: L LE Pain Descriptors / Indicators: Sore Pain Intervention(s): Limited activity within patient's tolerance;Monitored during session;Repositioned     Hand Dominance Right   Extremity/Trunk Assessment Upper Extremity Assessment Upper Extremity Assessment: Overall WFL for tasks assessed   Lower Extremity Assessment Lower Extremity Assessment: LLE deficits/detail LLE Deficits / Details: Decreased strength and ROM as expected post-operatively.   Cervical / Trunk Assessment Cervical / Trunk Assessment: Normal   Communication Communication Communication: No difficulties   Cognition Arousal/Alertness: Awake/alert Behavior During Therapy: WFL for tasks assessed/performed Overall Cognitive Status: Within Functional Limits for tasks assessed                                Home Living Family/patient expects to be discharged to:: Private residence Living Arrangements: Spouse/significant other Available Help at Discharge: Family;Friend(s);Available PRN/intermittently (Girlfriend works at night) Type of Home: House Home Access: Stairs to enter Entergy CorporationEntrance Stairs-Number of Steps: 5 Entrance Stairs-Rails: None Home Layout: One level     Bathroom Shower/Tub: Producer, television/film/videoWalk-in shower   Bathroom Toilet: Standard     Home Equipment:  Walker - 2 wheels;Shower seat;Wheelchair - Careers advisermanual;Other (comment);Crutches          Prior Functioning/Environment Level of Independence: Independent with assistive device(s)        Comments: used crutches         OT Problem List: Decreased  strength;Decreased range of motion;Decreased activity tolerance;Decreased safety awareness;Decreased knowledge of use of DME or AE;Pain;Decreased knowledge of precautions   OT Treatment/Interventions:      OT Goals(Current goals can be found in the care plan section) Acute Rehab OT Goals 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 OT Goal Formulation: With patient Time For Goal Achievement: 12/19/15 Potential to Achieve Goals: Good  OT Frequency:      End of Session Equipment Utilized During Treatment: Gait belt;Rolling walker Nurse Communication: Patient requests pain meds  Activity Tolerance: Patient tolerated treatment well Patient left: in bed;with call bell/phone within reach   Time: 1010-1024 OT Time Calculation (min): 14 min Charges:  OT General Charges $OT Visit: 1 Procedure OT Evaluation $OT Eval Moderate Complexity: 1 Procedure G-Codes: OT G-codes **NOT FOR INPATIENT CLASS** Functional Assessment Tool Used: clinical judgement Functional Limitation: Self care Self Care Current Status (R6045(G8987): At least 1 percent but less than 20 percent impaired, limited or restricted Self Care Goal Status (W0981(G8988): At least 1 percent but less than 20 percent impaired, limited or restricted Self Care Discharge Status [phone](G8989): At least 1 percent but less than 20 percent impaired, limited or restricted  Doristine SectionCharity A Addysen Louth, OTR/L [phone] 12/05/2015, 12:05 PM

## 2015-12-05 NOTE — Progress Notes (Signed)
Orthopaedic Trauma Service Progress Note  Subjective  Pain controlled No specific complaints  Error message on Prevena   ROS As above   Objective   BP (!) 152/75 (BP Location: Right Arm)   Pulse 79   Temp 98.5 F (36.9 C) (Oral)   Resp 16   Ht 5' 9.5" (1.765 m)   Wt 78.5 kg (173 lb)   SpO2 100%   BMI 25.18 kg/m   Intake/Output      10/31 0701 - 11/01 0700 11/01 0701 - 11/02 0700   P.O. 1280 240   I.V. (mL/kg) 1600 (20.4)    Total Intake(mL/kg) 2880 (36.7) 240 (3.1)   Urine (mL/kg/hr) 300 (0.2)    Drains 0 (0)    Blood 50 (0)    Total Output 350     Net +2530 +240        Urine Occurrence 3 x      Labs  Intra-op cultures from pinsites: gram + cocci   Exam  Gen: comfortable appearing, NAD  Ext:       Left Lower Extremity   Splint stable  Prevena unit with error message  Took splint and dressing down. Sponge is sucked all the way down. No apparent holes or leaks   Reinforced with Ioban, same error message  Distal motor and sensory functions unchanged   Dressing removed from donor site  Donor site looks excellent  Fan applied and reviewed care with pt    Assessment and Plan   POD/HD#: 1   -grade 3b open left pilon fracture treated with ex fix s/p ex fix removal  NWB for right now, will likely allow some WB in about 2-3 weeks  New dressing applied  Toe and ankle motion as tolerate  - chronic wound medial Left ankle s/p STSG  Currently with prevena sponge on   Looks good but will get home VAC to see if this alleviates error message   We will remove vac in office on 12/10/2015    Wound care to donor site as follows    Donor site instructions   DO NOT remove yellow xeroform layer for any reason  Pt to dab blood droplets as they form  Fan to xeroform layer to dry out site/sites DO NOT cover donor sites with any dressings, allow them to stay  exposed to air  Gently trim edges as they roll up  - Pain management:  Percocet at dc    - DVT/PE  prophylaxis:  No pharmacologics needed at dc   - pintract infection    Continue with ancef   Dc home with doxycycline 100 mg po BID x 4 weeks and cipro 500 mg po BID x 4 weeks    Will need match programs    Will adjust according to C&S   - Activity:  NWB Left leg    - Dispo:  DC home today once vac issues addressed      Mearl LatinKeith W. Yared Susan, PA-C [facility] [phone](P) [phone] (O) 12/05/2015 10:32 AM
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
## 2015-12-09 LAB — AEROBIC/ANAEROBIC CULTURE W GRAM STAIN (SURGICAL/DEEP WOUND)

## 2015-12-09 LAB — AEROBIC/ANAEROBIC CULTURE (SURGICAL/DEEP WOUND)

## 2016-02-11 ENCOUNTER — Encounter (HOSPITAL_COMMUNITY): Payer: Self-pay | Admitting: *Deleted

## 2016-02-11 NOTE — Anesthesia Preprocedure Evaluation (Addendum)
Anesthesia Evaluation  Patient identified by MRN, date of birth, ID band Patient awake    Reviewed: Allergy & Precautions, NPO status , Patient's Chart, lab work & pertinent test results  History of Anesthesia Complications Negative for: history of anesthetic complications  Airway Mallampati: II  TM Distance: >3 FB Neck ROM: Full   Comment: Patient is sedated, unable to examine fully Dental  (+) Dental Advidsory Given, Teeth Intact, Poor Dentition   Pulmonary Current Smoker,    Pulmonary exam normal        Cardiovascular hypertension, negative cardio ROS Normal cardiovascular exam Rhythm:regular     Neuro/Psych Seizures -,  Hx of DT's negative psych ROS   GI/Hepatic negative GI ROS, (+)     substance abuse  ,   Endo/Other  negative endocrine ROS  Renal/GU negative Renal ROS     Musculoskeletal negative musculoskeletal ROS (+)   Abdominal   Peds  Hematology negative hematology ROS (+)   Anesthesia Other Findings Day of surgery medications reviewed with the patient.  Reproductive/Obstetrics                           Anesthesia Physical  Anesthesia Plan  ASA: II  Anesthesia Plan: General   Post-op Pain Management:  Regional for Post-op pain   Induction: Intravenous  Airway Management Planned: LMA  Additional Equipment:   Intra-op Plan:   Post-operative Plan: Extubation in OR  Informed Consent: I have reviewed the patients History and Physical, chart, labs and discussed the procedure including the risks, benefits and alternatives for the proposed anesthesia with the patient or authorized representative who has indicated his/her understanding and acceptance.   Dental Advisory Given and Dental advisory given  Plan Discussed with: Surgeon, Anesthesiologist and CRNA  Anesthesia Plan Comments:       Anesthesia Quick Evaluation

## 2016-02-11 NOTE — Progress Notes (Signed)
Spoke with pt for pre-op call. Pt denies cardiac history, chest pain or sob. 

## 2016-02-12 ENCOUNTER — Inpatient Hospital Stay (HOSPITAL_COMMUNITY): Payer: Self-pay | Admitting: Anesthesiology

## 2016-02-12 ENCOUNTER — Encounter (HOSPITAL_COMMUNITY): Payer: Self-pay | Admitting: Anesthesiology

## 2016-02-12 ENCOUNTER — Encounter (HOSPITAL_COMMUNITY)
Admission: RE | Disposition: A | Discharge status: Home / Self Care | Payer: Self-pay | Source: Ambulatory Visit | Attending: Orthopedic Surgery

## 2016-02-12 ENCOUNTER — Inpatient Hospital Stay (HOSPITAL_COMMUNITY): Payer: Self-pay

## 2016-02-12 ENCOUNTER — Inpatient Hospital Stay (HOSPITAL_COMMUNITY)
Admission: RE | Admit: 2016-02-12 | Discharge: 2016-02-13 | DRG: 493 | Disposition: A | Discharge status: Home / Self Care | Payer: Self-pay | Source: Ambulatory Visit | Attending: Orthopedic Surgery | Admitting: Orthopedic Surgery

## 2016-02-12 DIAGNOSIS — S82872R Displaced pilon fracture of left tibia, subsequent encounter for open fracture type IIIA, IIIB, or IIIC with malunion: Secondary | ICD-10-CM | POA: Diagnosis present

## 2016-02-12 DIAGNOSIS — F17210 Nicotine dependence, cigarettes, uncomplicated: Secondary | ICD-10-CM | POA: Diagnosis present

## 2016-02-12 DIAGNOSIS — I10 Essential (primary) hypertension: Secondary | ICD-10-CM | POA: Diagnosis present

## 2016-02-12 DIAGNOSIS — S82872N Displaced pilon fracture of left tibia, subsequent encounter for open fracture type IIIA, IIIB, or IIIC with nonunion: Secondary | ICD-10-CM | POA: Diagnosis present

## 2016-02-12 DIAGNOSIS — Z419 Encounter for procedure for purposes other than remedying health state, unspecified: Secondary | ICD-10-CM

## 2016-02-12 DIAGNOSIS — S9302XA Subluxation of left ankle joint, initial encounter: Principal | ICD-10-CM | POA: Diagnosis present

## 2016-02-12 HISTORY — PX: ANKLE FUSION: SHX881

## 2016-02-12 HISTORY — DX: Subluxation of left ankle joint, initial encounter: S93.02XA

## 2016-02-12 HISTORY — DX: Displaced pilon fracture of left tibia, subsequent encounter for open fracture type IIIA, IIIB, or IIIC with malunion: S82.872R

## 2016-02-12 HISTORY — DX: Unspecified convulsions: R56.9

## 2016-02-12 LAB — CBC WITH DIFFERENTIAL/PLATELET
BASOS ABS: 0.1 10*3/uL (ref 0.0–0.1)
BASOS PCT: 1 %
EOS PCT: 6 %
Eosinophils Absolute: 0.5 10*3/uL (ref 0.0–0.7)
HCT: 36.9 % — ABNORMAL LOW (ref 39.0–52.0)
Hemoglobin: 12.1 g/dL — ABNORMAL LOW (ref 13.0–17.0)
Lymphocytes Relative: 35 %
Lymphs Abs: 2.7 10*3/uL (ref 0.7–4.0)
MCH: 25.4 pg — ABNORMAL LOW (ref 26.0–34.0)
MCHC: 32.8 g/dL (ref 30.0–36.0)
MCV: 77.4 fL — ABNORMAL LOW (ref 78.0–100.0)
MONO ABS: 0.6 10*3/uL (ref 0.1–1.0)
Monocytes Relative: 8 %
NEUTROS ABS: 3.8 10*3/uL (ref 1.7–7.7)
Neutrophils Relative %: 50 %
PLATELETS: 259 10*3/uL (ref 150–400)
RBC: 4.77 MIL/uL (ref 4.22–5.81)
RDW: 15.1 % (ref 11.5–15.5)
WBC: 7.7 10*3/uL (ref 4.0–10.5)

## 2016-02-12 LAB — COMPREHENSIVE METABOLIC PANEL
ALBUMIN: 3.8 g/dL (ref 3.5–5.0)
ALT: 12 U/L — ABNORMAL LOW (ref 17–63)
AST: 22 U/L (ref 15–41)
Alkaline Phosphatase: 71 U/L (ref 38–126)
Anion gap: 9 (ref 5–15)
CHLORIDE: 104 mmol/L (ref 101–111)
CO2: 26 mmol/L (ref 22–32)
Calcium: 9.6 mg/dL (ref 8.9–10.3)
Creatinine, Ser: 0.82 mg/dL (ref 0.61–1.24)
GFR calc Af Amer: >60 mL/min (ref >60)
GFR calc non Af Amer: >60 mL/min (ref >60)
GLUCOSE: 99 mg/dL (ref 65–99)
POTASSIUM: 3.6 mmol/L (ref 3.5–5.1)
SODIUM: 139 mmol/L (ref 135–145)
Total Bilirubin: 0.4 mg/dL (ref 0.3–1.2)
Total Protein: 6.4 g/dL — ABNORMAL LOW (ref 6.5–8.1)

## 2016-02-12 LAB — PROTIME-INR
INR: 1.21
PROTHROMBIN TIME: 15.4 s — AB (ref 11.4–15.2)

## 2016-02-12 LAB — C-REACTIVE PROTEIN

## 2016-02-12 LAB — SEDIMENTATION RATE: SED RATE: 1 mm/h (ref 0–16)

## 2016-02-12 LAB — APTT: aPTT: 30 seconds (ref 24–36)

## 2016-02-12 SURGERY — ARTHRODESIS ANKLE
Anesthesia: General | Laterality: Left

## 2016-02-12 MED ORDER — FENTANYL CITRATE (PF) 100 MCG/2ML IJ SOLN
INTRAMUSCULAR | Status: DC | PRN
  Administered 2016-02-12 (×3): 50 ug via INTRAVENOUS
  Administered 2016-02-12: 100 ug via INTRAVENOUS
  Administered 2016-02-12 (×3): 50 ug via INTRAVENOUS

## 2016-02-12 MED ORDER — NALOXONE HCL 0.4 MG/ML IJ SOLN: 0.4000 mg | INTRAMUSCULAR | Status: DC | PRN

## 2016-02-12 MED ORDER — POLYETHYLENE GLYCOL 3350 17 G PO PACK: 17.0000 g | PACK | Freq: Every day | ORAL | Status: DC

## 2016-02-12 MED ORDER — LACTATED RINGERS IV SOLN
INTRAVENOUS | Status: DC | PRN
  Administered 2016-02-12 (×2): via INTRAVENOUS

## 2016-02-12 MED ORDER — HYDROMORPHONE 1 MG/ML IV SOLN
INTRAVENOUS | Status: DC
  Administered 2016-02-12: 0.6 mg via INTRAVENOUS
  Administered 2016-02-12: 14:00:00 via INTRAVENOUS
  Administered 2016-02-13: 3.6 mg via INTRAVENOUS
  Administered 2016-02-13: 0.6 mg via INTRAVENOUS
  Administered 2016-02-13: 0.9 mg via INTRAVENOUS

## 2016-02-12 MED ORDER — ENOXAPARIN SODIUM 40 MG/0.4ML ~~LOC~~ SOLN
40.0000 mg | SUBCUTANEOUS | Status: DC
  Administered 2016-02-13: 40 mg via SUBCUTANEOUS
  Filled 2016-02-12: qty 0.4

## 2016-02-12 MED ORDER — DIPHENHYDRAMINE HCL 50 MG/ML IJ SOLN: 12.5000 mg | Freq: Four times a day (QID) | INTRAMUSCULAR | Status: DC | PRN

## 2016-02-12 MED ORDER — POTASSIUM CHLORIDE IN NACL 20-0.9 MEQ/L-% IV SOLN
INTRAVENOUS | Status: DC
  Administered 2016-02-12 – 2016-02-13 (×2): via INTRAVENOUS
  Filled 2016-02-12 (×2): qty 1000

## 2016-02-12 MED ORDER — DOCUSATE SODIUM 100 MG PO CAPS
100.0000 mg | ORAL_CAPSULE | Freq: Two times a day (BID) | ORAL | Status: DC
  Administered 2016-02-12 – 2016-02-13 (×2): 100 mg via ORAL
  Filled 2016-02-12 (×2): qty 1

## 2016-02-12 MED ORDER — HYDROMORPHONE 1 MG/ML IV SOLN
INTRAVENOUS | Status: AC
  Filled 2016-02-12: qty 25

## 2016-02-12 MED ORDER — BUPIVACAINE HCL (PF) 0.25 % IJ SOLN
INTRAMUSCULAR | Status: AC
  Filled 2016-02-12: qty 30

## 2016-02-12 MED ORDER — ONDANSETRON HCL 4 MG/2ML IJ SOLN: 4.0000 mg | Freq: Four times a day (QID) | INTRAMUSCULAR | Status: DC | PRN

## 2016-02-12 MED ORDER — MIDAZOLAM HCL 2 MG/2ML IJ SOLN
INTRAMUSCULAR | Status: AC
  Filled 2016-02-12: qty 2

## 2016-02-12 MED ORDER — CEFAZOLIN IN D5W 1 GM/50ML IV SOLN
1.0000 g | Freq: Four times a day (QID) | INTRAVENOUS | Status: AC
  Administered 2016-02-12 – 2016-02-13 (×3): 1 g via INTRAVENOUS
  Filled 2016-02-12 (×3): qty 50

## 2016-02-12 MED ORDER — ACETAMINOPHEN 500 MG PO TABS
1000.0000 mg | ORAL_TABLET | Freq: Four times a day (QID) | ORAL | Status: DC
  Administered 2016-02-12 – 2016-02-13 (×4): 1000 mg via ORAL
  Filled 2016-02-12 (×3): qty 2

## 2016-02-12 MED ORDER — CHLORHEXIDINE GLUCONATE 4 % EX LIQD: 60.0000 mL | Freq: Once | CUTANEOUS | Status: DC

## 2016-02-12 MED ORDER — ACETAMINOPHEN 500 MG PO TABS
1000.0000 mg | ORAL_TABLET | Freq: Once | ORAL | Status: AC
  Administered 2016-02-12: 1000 mg via ORAL
  Filled 2016-02-12: qty 2

## 2016-02-12 MED ORDER — METOCLOPRAMIDE HCL 5 MG/ML IJ SOLN: 5.0000 mg | Freq: Three times a day (TID) | INTRAMUSCULAR | Status: DC | PRN

## 2016-02-12 MED ORDER — DEXMEDETOMIDINE HCL 200 MCG/2ML IV SOLN
INTRAVENOUS | Status: DC | PRN
  Administered 2016-02-12: 8 ug via INTRAVENOUS
  Administered 2016-02-12 (×2): 4 ug via INTRAVENOUS

## 2016-02-12 MED ORDER — OXYCODONE HCL 5 MG PO TABS
10.0000 mg | ORAL_TABLET | ORAL | Status: DC | PRN
  Administered 2016-02-13: 10 mg via ORAL
  Administered 2016-02-13: 20 mg via ORAL
  Filled 2016-02-12: qty 4
  Filled 2016-02-12: qty 2

## 2016-02-12 MED ORDER — HYDROMORPHONE HCL 2 MG/ML IJ SOLN
1.0000 mg | INTRAMUSCULAR | Status: DC | PRN
  Administered 2016-02-13: 1 mg via INTRAVENOUS
  Filled 2016-02-12: qty 1

## 2016-02-12 MED ORDER — METOCLOPRAMIDE HCL 5 MG PO TABS: 5.0000 mg | ORAL_TABLET | Freq: Three times a day (TID) | ORAL | Status: DC | PRN

## 2016-02-12 MED ORDER — DEXAMETHASONE SODIUM PHOSPHATE 10 MG/ML IJ SOLN
INTRAMUSCULAR | Status: DC | PRN
  Administered 2016-02-12: 10 mg via INTRAVENOUS

## 2016-02-12 MED ORDER — MIDAZOLAM HCL 5 MG/5ML IJ SOLN
INTRAMUSCULAR | Status: DC | PRN
  Administered 2016-02-12: 2 mg via INTRAVENOUS

## 2016-02-12 MED ORDER — CEFAZOLIN SODIUM 1 G IJ SOLR
INTRAMUSCULAR | Status: AC
  Filled 2016-02-12: qty 20

## 2016-02-12 MED ORDER — DEXTROSE 5 % IV SOLN
500.0000 mg | Freq: Four times a day (QID) | INTRAVENOUS | Status: DC
  Filled 2016-02-12 (×6): qty 5

## 2016-02-12 MED ORDER — HYDROMORPHONE HCL 1 MG/ML IJ SOLN: 0.2500 mg | INTRAMUSCULAR | Status: DC | PRN

## 2016-02-12 MED ORDER — ONDANSETRON HCL 4 MG PO TABS: 4.0000 mg | ORAL_TABLET | Freq: Four times a day (QID) | ORAL | Status: DC | PRN

## 2016-02-12 MED ORDER — SODIUM CHLORIDE 0.9% FLUSH: 9.0000 mL | INTRAVENOUS | Status: DC | PRN

## 2016-02-12 MED ORDER — PROPOFOL 10 MG/ML IV BOLUS
INTRAVENOUS | Status: DC | PRN
  Administered 2016-02-12: 200 mg via INTRAVENOUS

## 2016-02-12 MED ORDER — ESMOLOL HCL 100 MG/10ML IV SOLN
INTRAVENOUS | Status: DC | PRN
  Administered 2016-02-12 (×9): 10 mg via INTRAVENOUS

## 2016-02-12 MED ORDER — ROCURONIUM BROMIDE 100 MG/10ML IV SOLN
INTRAVENOUS | Status: DC | PRN
  Administered 2016-02-12: 50 mg via INTRAVENOUS
  Administered 2016-02-12: 20 mg via INTRAVENOUS

## 2016-02-12 MED ORDER — FENTANYL CITRATE (PF) 100 MCG/2ML IJ SOLN
INTRAMUSCULAR | Status: AC
  Filled 2016-02-12: qty 4

## 2016-02-12 MED ORDER — PHENYLEPHRINE 40 MCG/ML (10ML) SYRINGE FOR IV PUSH (FOR BLOOD PRESSURE SUPPORT)
PREFILLED_SYRINGE | INTRAVENOUS | Status: AC
  Filled 2016-02-12: qty 10

## 2016-02-12 MED ORDER — ONDANSETRON HCL 4 MG/2ML IJ SOLN
INTRAMUSCULAR | Status: DC | PRN
  Administered 2016-02-12: 4 mg via INTRAVENOUS

## 2016-02-12 MED ORDER — BUPIVACAINE-EPINEPHRINE (PF) 0.5% -1:200000 IJ SOLN
INTRAMUSCULAR | Status: DC | PRN
  Administered 2016-02-12: 30 mL via PERINEURAL

## 2016-02-12 MED ORDER — ONDANSETRON HCL 4 MG/2ML IJ SOLN
INTRAMUSCULAR | Status: AC
  Filled 2016-02-12: qty 2

## 2016-02-12 MED ORDER — SUGAMMADEX SODIUM 200 MG/2ML IV SOLN
INTRAVENOUS | Status: DC | PRN
  Administered 2016-02-12: 300 mg via INTRAVENOUS

## 2016-02-12 MED ORDER — PHENYLEPHRINE HCL 10 MG/ML IJ SOLN
INTRAMUSCULAR | Status: DC | PRN
  Administered 2016-02-12 (×2): 80 ug via INTRAVENOUS

## 2016-02-12 MED ORDER — DEXAMETHASONE SODIUM PHOSPHATE 10 MG/ML IJ SOLN
INTRAMUSCULAR | Status: AC
  Filled 2016-02-12: qty 1

## 2016-02-12 MED ORDER — FENTANYL CITRATE (PF) 100 MCG/2ML IJ SOLN
INTRAMUSCULAR | Status: AC
  Filled 2016-02-12: qty 2

## 2016-02-12 MED ORDER — PROPOFOL 10 MG/ML IV BOLUS
INTRAVENOUS | Status: AC
  Filled 2016-02-12: qty 20

## 2016-02-12 MED ORDER — DIPHENHYDRAMINE HCL 12.5 MG/5ML PO ELIX: 12.5000 mg | ORAL_SOLUTION | Freq: Four times a day (QID) | ORAL | Status: DC | PRN

## 2016-02-12 MED ORDER — PROMETHAZINE HCL 25 MG/ML IJ SOLN: 6.2500 mg | INTRAMUSCULAR | Status: DC | PRN

## 2016-02-12 MED ORDER — ROCURONIUM BROMIDE 50 MG/5ML IV SOSY
PREFILLED_SYRINGE | INTRAVENOUS | Status: AC
  Filled 2016-02-12: qty 10

## 2016-02-12 MED ORDER — LABETALOL HCL 5 MG/ML IV SOLN
INTRAVENOUS | Status: DC | PRN
  Administered 2016-02-12: 2.5 mg via INTRAVENOUS
  Administered 2016-02-12: 7.5 mg via INTRAVENOUS

## 2016-02-12 MED ORDER — ACETAMINOPHEN 500 MG PO TABS
ORAL_TABLET | ORAL | Status: AC
  Filled 2016-02-12: qty 2

## 2016-02-12 MED ORDER — 0.9 % SODIUM CHLORIDE (POUR BTL) OPTIME
TOPICAL | Status: DC | PRN
  Administered 2016-02-12: 1000 mL

## 2016-02-12 MED ORDER — ACETAMINOPHEN 650 MG RE SUPP: 650.0000 mg | Freq: Four times a day (QID) | RECTAL | Status: DC

## 2016-02-12 MED ORDER — CEFAZOLIN SODIUM-DEXTROSE 2-4 GM/100ML-% IV SOLN
2.0000 g | INTRAVENOUS | Status: AC
  Administered 2016-02-12 (×2): 2 g via INTRAVENOUS
  Filled 2016-02-12: qty 100

## 2016-02-12 MED ORDER — METHOCARBAMOL 500 MG PO TABS
1000.0000 mg | ORAL_TABLET | Freq: Four times a day (QID) | ORAL | Status: DC
  Administered 2016-02-12 (×2): 1000 mg via ORAL
  Filled 2016-02-12 (×3): qty 2

## 2016-02-12 SURGICAL SUPPLY — 79 items
BANDAGE ACE 4X5 VEL STRL LF (GAUZE/BANDAGES/DRESSINGS) IMPLANT
BANDAGE ACE 6X5 VEL STRL LF (GAUZE/BANDAGES/DRESSINGS) IMPLANT
BANDAGE ESMARK 6X9 LF (GAUZE/BANDAGES/DRESSINGS) ×1 IMPLANT
BIT DRILL AO CONN 2.7X125 (BIT) ×2 IMPLANT
BIT DRILL TWIST 3.2X145 THRD (BIT) ×2 IMPLANT
BLADE LONG MED 31X9 (MISCELLANEOUS) ×2 IMPLANT
BLADE SURG 10 STRL SS (BLADE) ×4 IMPLANT
BLADE SURG 15 STRL LF DISP TIS (BLADE) ×1 IMPLANT
BLADE SURG 15 STRL SS (BLADE) ×1
BNDG COHESIVE 4X5 TAN STRL (GAUZE/BANDAGES/DRESSINGS) IMPLANT
BNDG ESMARK 6X9 LF (GAUZE/BANDAGES/DRESSINGS) ×2
BNDG GAUZE ELAST 4 BULKY (GAUZE/BANDAGES/DRESSINGS) IMPLANT
BRUSH SCRUB DISP (MISCELLANEOUS) ×4 IMPLANT
COVER MAYO STAND STRL (DRAPES) ×2 IMPLANT
COVER SURGICAL LIGHT HANDLE (MISCELLANEOUS) ×2 IMPLANT
CUFF TOURNIQUET SINGLE 34IN LL (TOURNIQUET CUFF) ×2 IMPLANT
DRAPE C-ARM 42X72 X-RAY (DRAPES) ×2 IMPLANT
DRAPE C-ARMOR (DRAPES) ×2 IMPLANT
DRAPE INCISE IOBAN 66X45 STRL (DRAPES) ×2 IMPLANT
DRAPE ORTHO SPLIT 77X108 STRL (DRAPES) ×1
DRAPE SURG ORHT 6 SPLT 77X108 (DRAPES) ×1 IMPLANT
DRAPE U-SHAPE 47X51 STRL (DRAPES) ×2 IMPLANT
DRSG ADAPTIC 3X8 NADH LF (GAUZE/BANDAGES/DRESSINGS) ×2 IMPLANT
DRSG EMULSION OIL 3X3 NADH (GAUZE/BANDAGES/DRESSINGS) IMPLANT
DRSG PAD ABDOMINAL 8X10 ST (GAUZE/BANDAGES/DRESSINGS) ×2 IMPLANT
ELECT REM PT RETURN 9FT ADLT (ELECTROSURGICAL) ×2
ELECTRODE REM PT RTRN 9FT ADLT (ELECTROSURGICAL) ×1 IMPLANT
GAUZE SPONGE 4X4 12PLY STRL (GAUZE/BANDAGES/DRESSINGS) IMPLANT
GLOVE BIO SURGEON STRL SZ7.5 (GLOVE) ×2 IMPLANT
GLOVE BIO SURGEON STRL SZ8 (GLOVE) ×4 IMPLANT
GLOVE BIOGEL PI IND STRL 7.5 (GLOVE) ×1 IMPLANT
GLOVE BIOGEL PI INDICATOR 7.5 (GLOVE) ×1
GOWN STRL REUS W/ TWL LRG LVL3 (GOWN DISPOSABLE) ×2 IMPLANT
GOWN STRL REUS W/ TWL XL LVL3 (GOWN DISPOSABLE) ×1 IMPLANT
GOWN STRL REUS W/TWL LRG LVL3 (GOWN DISPOSABLE) ×2
GOWN STRL REUS W/TWL XL LVL3 (GOWN DISPOSABLE) ×1
KIT BASIN OR (CUSTOM PROCEDURE TRAY) ×2 IMPLANT
KIT ROOM TURNOVER OR (KITS) ×2 IMPLANT
MANIFOLD NEPTUNE II (INSTRUMENTS) ×2 IMPLANT
NS IRRIG 1000ML POUR BTL (IV SOLUTION) ×2 IMPLANT
PACK ORTHO EXTREMITY (CUSTOM PROCEDURE TRAY) ×2 IMPLANT
PAD ARMBOARD 7.5X6 YLW CONV (MISCELLANEOUS) ×4 IMPLANT
PAD CAST 4YDX4 CTTN HI CHSV (CAST SUPPLIES) IMPLANT
PADDING CAST ABS 4INX4YD NS (CAST SUPPLIES) ×1
PADDING CAST ABS COTTON 4X4 ST (CAST SUPPLIES) ×1 IMPLANT
PADDING CAST COTTON 4X4 STRL (CAST SUPPLIES)
PADDING CAST COTTON 6X4 STRL (CAST SUPPLIES) IMPLANT
PENCIL BUTTON HOLSTER BLD 10FT (ELECTRODE) IMPLANT
PIN W-SCR THD 4.0 AO 140 (PIN) ×2 IMPLANT
PLATE ANKLE FIX TI LG LT (Plate) ×2 IMPLANT
SCREW TI LOCK 40X26 TX10 (Screw) ×4 IMPLANT
SCREW TI LOCK 40X34 TX10 (Screw) ×6 IMPLANT
SCREW TI LOCK 40X38 TX10 (Screw) ×2 IMPLANT
SCREW TI LOCK 40X44 TX10 (Screw) ×2 IMPLANT
SCREW TI STD 40X32 TX10 (Screw) ×2 IMPLANT
SCREW TI STD 40X34 TX10 (Screw) ×2 IMPLANT
SCREW TI STD 40X36 TX10 (Screw) ×2 IMPLANT
SCREW TI STD 40X38 TX10 (Screw) ×2 IMPLANT
SCREW TI STD 40X40 TX10 (Screw) ×2 IMPLANT
SCREW TI STD 40X42 TX10 (Screw) ×2 IMPLANT
SCREW TI STD 40X44 TX10 (Screw) ×4 IMPLANT
SCREW TI STD 40X46 TX10 (Screw) ×2 IMPLANT
SPONGE LAP 18X18 X RAY DECT (DISPOSABLE) ×2 IMPLANT
SPONGE SCRUB IODOPHOR (GAUZE/BANDAGES/DRESSINGS) ×4 IMPLANT
SUCTION FRAZIER HANDLE 10FR (MISCELLANEOUS) ×2
SUCTION TUBE FRAZIER 10FR DISP (MISCELLANEOUS) ×2 IMPLANT
SUT ETHILON 3 0 PS 1 (SUTURE) ×4 IMPLANT
SUT VIC AB 0 CT1 27 (SUTURE) ×1
SUT VIC AB 0 CT1 27XBRD ANBCTR (SUTURE) ×1 IMPLANT
SUT VIC AB 2-0 CT3 27 (SUTURE) IMPLANT
SUT VIC AB 2-0 SH 18 (SUTURE) ×2 IMPLANT
SUT VIC AB 3-0 FS2 27 (SUTURE) ×2 IMPLANT
SYSTEM CHEST DRAIN TLS 7FR (DRAIN) IMPLANT
TOWEL OR 17X24 6PK STRL BLUE (TOWEL DISPOSABLE) ×4 IMPLANT
TOWEL OR 17X26 10 PK STRL BLUE (TOWEL DISPOSABLE) ×4 IMPLANT
TUBE CONNECTING 12X1/4 (SUCTIONS) ×4 IMPLANT
UNDERPAD 30X30 (UNDERPADS AND DIAPERS) ×2 IMPLANT
WATER STERILE IRR 1000ML POUR (IV SOLUTION) IMPLANT
WIRE K 1.6MM 144256 (MISCELLANEOUS) ×4 IMPLANT

## 2016-02-12 NOTE — Brief Op Note (Signed)
02/12/2016  12:45 PM  PATIENT:  Damon FolksSamuel D Alexander  31 y.o. male  PRE-OPERATIVE DIAGNOSIS:   1. 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 LEFT ANKLE CHRONIC SUBLUXATION  POST-OPERATIVE DIAGNOSIS:   1. 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 LEFT ANKLE CHRONIC SUBLUXATION  PROCEDURE:  Procedure(s): 1. ARTHRODESIS ANKLE (Left) 2. FIBULAR OSTEOTOMY 3. AUTOGRAFTING  SURGEON:  Surgeon(s) and Role:    * Myrene GalasMichael Tanysha Quant, MD - Primary  ASSISTANTS: April Green, RN-FA   ANESTHESIA:   regional and general  EBL:  Total I/O In: 1700 [I.V.:1700] Out: 200 [Blood:200]  BLOOD ADMINISTERED:none  DRAINS: none   LOCAL MEDICATIONS USED:  NONE  SPECIMEN:  No Specimen  DISPOSITION OF SPECIMEN:  N/A  COUNTS:  YES  TOURNIQUET:   Total Tourniquet Time Documented: Thigh (Left) - 123 minutes Total: Thigh (Left) - 123 minutes   DICTATION: .Other Dictation: Dictation Number [id number]  PLAN OF CARE: Admit to inpatient   PATIENT DISPOSITION:  PACU - hemodynamically stable.   Delay start of Pharmacological VTE agent (>24hrs) due to surgical blood loss or risk of bleeding: no

## 2016-02-12 NOTE — Transfer of Care (Signed)
Immediate Anesthesia Transfer of Care Note  Patient: Matthew FolksSamuel D Habenicht  Procedure(s) Performed: Procedure(s): ARTHRODESIS ANKLE (Left)  Patient Location: PACU  Anesthesia Type:General  Level of Consciousness: awake, alert  and oriented  Airway & Oxygen Therapy: Patient Spontanous Breathing and Patient connected to nasal cannula oxygen  Post-op Assessment: Report given to RN  Post vital signs: Reviewed and stable  Last Vitals:  Vitals:   02/12/16 0707 02/12/16 1240  BP: (!) 149/82 (!) 143/75  Pulse: 70 99  Resp: 18 17  Temp: 36.8 C 36.6 C    Last Pain:  Vitals:   02/12/16 0707  TempSrc: Oral      Patients Stated Pain Goal: 3 (02/12/16 0725)  Complications: No apparent anesthesia complications

## 2016-02-12 NOTE — Anesthesia Procedure Notes (Addendum)
Anesthesia Regional Block:  Popliteal block  Pre-Anesthetic Checklist: ,, timeout performed, Correct Patient, Correct Site, Correct Laterality, Correct Procedure, Correct Position, site marked, Risks and benefits discussed,  Surgical consent,  Pre-op evaluation,  At surgeon's request and post-op pain management  Laterality: Left  Prep: chloraprep       Needles:  Injection technique: Single-shot  Needle Type: Echogenic Stimulator Needle     Needle Length:cm 9 cm Needle Gauge: 21 G    Additional Needles:  Procedures: ultrasound guided (picture in chart) and nerve stimulator Popliteal block  Nerve Stimulator or Paresthesia:  Response: plantar flexion, 0.45 mA,   Additional Responses:   Narrative:  Start time: 02/12/2016 7:48 AM End time: 02/12/2016 7:58 AM Injection made incrementally with aspirations every 5 mL.  Performed by: Personally  Anesthesiologist: Heather RobertsSINGER, Timon Geissinger  Additional Notes: A functioning IV was confirmed and monitors were applied.  Sterile prep and drape, hand hygiene and sterile gloves were used.  Negative aspiration and test dose prior to incremental administration of local anesthetic. The patient tolerated the procedure well.Ultrasound  guidance: relevant anatomy identified, needle position confirmed, local anesthetic spread visualized around nerve(s), vascular puncture avoided.  Image printed for medical record.

## 2016-02-12 NOTE — Progress Notes (Signed)
Spoke to CorazinKeith, [place] regarding patient unable to urinate. Per Mellody DanceKeith, [place] ok to proceed if unable to collect.

## 2016-02-12 NOTE — Progress Notes (Signed)
Patient states unable to urinate.
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
## 2016-02-12 NOTE — Anesthesia Procedure Notes (Signed)
Procedure Name: Intubation Date/Time: 02/12/2016 8:22 AM Performed by: Neldon Newport Pre-anesthesia Checklist: Timeout performed, Patient being monitored, Suction available, Emergency Drugs available and Patient identified Patient Re-evaluated:Patient Re-evaluated prior to inductionOxygen Delivery Method: Circle system utilized Preoxygenation: Pre-oxygenation with 100% oxygen Intubation Type: IV induction Ventilation: Mask ventilation without difficulty Laryngoscope Size: Mac, 3 and 4 (tried with a 4 first.  Prominant teeth made using this blade difficult.  Finished with a Mac 3) Grade View: Grade II Tube type: Oral Tube size: 7.5 mm Number of attempts: 2 Placement Confirmation: breath sounds checked- equal and bilateral,  positive ETCO2 and ETT inserted through vocal cords under direct vision Secured at: 23 cm Tube secured with: Tape Dental Injury: Teeth and Oropharynx as per pre-operative assessment

## 2016-02-13 LAB — BASIC METABOLIC PANEL WITH GFR
Anion gap: 10 (ref 5–15)
BUN: 5 mg/dL — ABNORMAL LOW (ref 6–20)
CO2: 25 mmol/L (ref 22–32)
Calcium: 9.1 mg/dL (ref 8.9–10.3)
Chloride: 104 mmol/L (ref 101–111)
Creatinine, Ser: 0.83 mg/dL (ref 0.61–1.24)
GFR calc Af Amer: >60 mL/min
GFR calc non Af Amer: >60 mL/min
Glucose, Bld: 122 mg/dL — ABNORMAL HIGH (ref 65–99)
Potassium: 3.8 mmol/L (ref 3.5–5.1)
Sodium: 139 mmol/L (ref 135–145)

## 2016-02-13 LAB — CBC
HCT: 33.7 % — ABNORMAL LOW (ref 39.0–52.0)
Hemoglobin: 10.8 g/dL — ABNORMAL LOW (ref 13.0–17.0)
MCH: 24.9 pg — ABNORMAL LOW (ref 26.0–34.0)
MCHC: 32 g/dL (ref 30.0–36.0)
MCV: 77.6 fL — ABNORMAL LOW (ref 78.0–100.0)
Platelets: 265 10*3/uL (ref 150–400)
RBC: 4.34 MIL/uL (ref 4.22–5.81)
RDW: 15 % (ref 11.5–15.5)
WBC: 13.7 10*3/uL — ABNORMAL HIGH (ref 4.0–10.5)

## 2016-02-13 LAB — MRSA PCR SCREENING: MRSA by PCR: NEGATIVE

## 2016-02-13 MED ORDER — ASPIRIN EC 325 MG PO TBEC: 325.0000 mg | DELAYED_RELEASE_TABLET | Freq: Two times a day (BID) | ORAL | 0 refills | Status: DC

## 2016-02-13 MED ORDER — DOCUSATE SODIUM 100 MG PO CAPS: 100.0000 mg | ORAL_CAPSULE | Freq: Two times a day (BID) | ORAL | 0 refills | Status: DC

## 2016-02-13 MED ORDER — METHOCARBAMOL 500 MG PO TABS: 1000.0000 mg | ORAL_TABLET | Freq: Four times a day (QID) | ORAL | 0 refills | Status: DC

## 2016-02-13 MED ORDER — METHOCARBAMOL 500 MG PO TABS
500.0000 mg | ORAL_TABLET | Freq: Four times a day (QID) | ORAL | Status: DC | PRN
  Administered 2016-02-13: 500 mg via ORAL
  Filled 2016-02-13: qty 1

## 2016-02-13 MED ORDER — OXYCODONE HCL 10 MG PO TABS: 10.0000 mg | ORAL_TABLET | Freq: Four times a day (QID) | ORAL | 0 refills | Status: DC | PRN

## 2016-02-13 MED ORDER — OXYCODONE-ACETAMINOPHEN 5-325 MG PO TABS: 1.0000 | ORAL_TABLET | Freq: Four times a day (QID) | ORAL | 0 refills | Status: DC | PRN

## 2016-02-13 MED ORDER — POLYETHYLENE GLYCOL 3350 17 G PO PACK: 17.0000 g | PACK | Freq: Every day | ORAL | 0 refills | Status: DC
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
## 2016-02-13 NOTE — Progress Notes (Signed)
Orthopaedic Trauma Service (OTS)  Subjective: 1 Day Post-Op Procedure(s) (LRB): ARTHRODESIS ANKLE (Left) Patient reports pain as moderate.   Block wore off with noticeable effect!  Hopeful to go home today. Has already cleared PT eval this am.  Objective: Current Vitals Blood pressure 136/60, pulse 84, temperature 98.1 F (36.7 C), temperature source Oral, resp. rate 18, height 5\' 10"  (1.778 m), weight 97.5 kg (215 lb), SpO2 100 %. Vital signs in last 24 hours: Temp:  [97.5 F (36.4 C)-99 F (37.2 C)] 98.1 F (36.7 C) (01/10 0531) Pulse Rate:  [79-99] 84 (01/10 0531) Resp:  [15-20] 18 (01/10 0800) BP: (122-144)/(60-87) 136/60 (01/10 0531) SpO2:  [97 %-100 %] 100 % (01/10 0800)  Intake/Output from previous day: 01/09 0701 - 01/10 0700 In: 3180 [P.O.:480; I.V.:2600; IV Piggyback:100] Out: 4492 [Urine:4291; Stool:1; Blood:200]  LABS  Recent Labs  02/12/16 0724 02/13/16 0538  HGB 12.1* 10.8*    Recent Labs  02/12/16 0724 02/13/16 0538  WBC 7.7 13.7*  RBC 4.77 4.34  HCT 36.9* 33.7*  PLT 259 265    Recent Labs  02/12/16 0724 02/13/16 0538  NA 139 139  K 3.6 3.8  CL 104 104  CO2 26 25  BUN <5* 5*  CREATININE 0.82 0.83  GLUCOSE 99 122*  CALCIUM 9.6 9.1    Recent Labs  02/12/16 0724  INR 1.21    Physical Exam LLE Dressing intact, clean, dry  Edema/ swelling controlled  Sens: DPN, SPN, TN intact  Motor: EHL, FHL, and lessor toe ext and flex all intact grossly  Brisk cap refill, warm to touch  Imaging Dg Ankle Complete Left  Result Date: 02/12/2016 CLINICAL DATA:  Status post tibiotalar fusion EXAM: LEFT ANKLE COMPLETE - 3+ VIEW COMPARISON:  Intraoperative films from earlier in the same day FINDINGS: Fixation sideplate is again noted along the lateral aspect of the distal tibia with fixation screws extending into the tibia and talus. The overall appearance is similar to that seen on the intraoperative exam. IMPRESSION: Tibiotalar fusion stable from  the intraoperative study. Electronically Signed   By: Alcide CleverMark  Lukens M.D.   On: 02/12/2016 14:07   Dg Ankle Complete Left  Result Date: 02/12/2016 CLINICAL DATA:  Known distal tibial and fibular fractures EXAM: LEFT ANKLE COMPLETE - 3+ VIEW; DG C-ARM 61-120 MIN COMPARISON:  12/04/2015 FLUOROSCOPY TIME:  Fluoroscopy Time:  1 minutes Radiation Exposure Index (if provided by the fluoroscopic device): Not available Number of Acquired Spot Images: 6 FINDINGS: Previously seen distal fibular fragments have been removed in the interval from the prior exam. The medial malleolar fracture is somewhat displaced from the distal tibia. Fixation sideplate along the distal tibia laterally is noted with multiple fixation screws extending into the tibia and into the talus. IMPRESSION: Tibiotalar fusion as described. Removal of multiple fibular fracture fragments are noted. Electronically Signed   By: Alcide CleverMark  Lukens M.D.   On: 02/12/2016 12:25   Dg C-arm 61-120 Min  Result Date: 02/12/2016 CLINICAL DATA:  Known distal tibial and fibular fractures EXAM: LEFT ANKLE COMPLETE - 3+ VIEW; DG C-ARM 61-120 MIN COMPARISON:  12/04/2015 FLUOROSCOPY TIME:  Fluoroscopy Time:  1 minutes Radiation Exposure Index (if provided by the fluoroscopic device): Not available Number of Acquired Spot Images: 6 FINDINGS: Previously seen distal fibular fragments have been removed in the interval from the prior exam. The medial malleolar fracture is somewhat displaced from the distal tibia. Fixation sideplate along the distal tibia laterally is noted with multiple fixation screws extending into  the tibia and into the talus. IMPRESSION: Tibiotalar fusion as described. Removal of multiple fibular fracture fragments are noted. Electronically Signed   By: Alcide Clever M.D.   On: 02/12/2016 12:25    Assessment/Plan: 1 Day Post-Op Procedure(s) (LRB): ARTHRODESIS ANKLE (Left) 1. NWB; splint in place until f/u appt in 10-14 days when converts into a cast for 2-3  more weeks 2. Lovenox if feasible, o/w ECASA for DVT proph 3. Ice, elevate 4. D/c to home today if pain allows  Myrene Galas, MD [facility] [phone] (p)  02/13/2016, 9:18 AM

## 2016-02-13 NOTE — Discharge Instructions (Signed)
Orthopaedic Trauma Service Discharge Instructions   General Discharge Instructions  WEIGHT BEARING STATUS: Nonweightbearing Left leg  RANGE OF MOTION/ACTIVITY: do not remove splint  Wound Care:do not remove splint. Keep splint clean and dry. We will remove at first follow up appointment   PAIN MEDICATION USE AND EXPECTATIONS  You have likely been given narcotic medications to help control your pain.  After a traumatic event that results in an fracture (broken bone) with or without surgery, it is ok to use narcotic pain medications to help control one's pain.  We understand that everyone responds to pain differently and each individual patient will be evaluated on a regular basis for the continued need for narcotic medications. Ideally, narcotic medication use should last no more than 6-8 weeks (coinciding with fracture healing).   As a patient it is your responsibility as well to monitor narcotic medication use and report the amount and frequency you use these medications when you come to your office visit.   We would also advise that if you are using narcotic medications, you should take a dose prior to therapy to maximize you participation.  IF YOU ARE ON NARCOTIC MEDICATIONS IT IS NOT PERMISSIBLE TO OPERATE A MOTOR VEHICLE (MOTORCYCLE/CAR/TRUCK/MOPED) OR HEAVY MACHINERY DO NOT MIX NARCOTICS WITH OTHER CNS (CENTRAL NERVOUS SYSTEM) DEPRESSANTS SUCH AS ALCOHOL  Diet: as you were eating previously.  Can use over the counter stool softeners and bowel preparations, such as Miralax, to help with bowel movements.  Narcotics can be constipating.  Be sure to drink plenty of fluids    STOP SMOKING OR USING NICOTINE PRODUCTS!!!!  As discussed nicotine severely impairs your body's ability to heal surgical and traumatic wounds but also impairs bone healing.  Wounds and bone heal by forming microscopic blood vessels (angiogenesis) and nicotine is a vasoconstrictor (essentially, shrinks blood vessels).   Therefore, if vasoconstriction occurs to these microscopic blood vessels they essentially disappear and are unable to deliver necessary nutrients to the healing tissue.  This is one modifiable factor that you can do to dramatically increase your chances of healing your injury.    (This means no smoking, no nicotine gum, patches, etc)  DO NOT USE NONSTEROIDAL ANTI-INFLAMMATORY DRUGS (NSAID'S)  Using products such as Advil (ibuprofen), Aleve (naproxen), Motrin (ibuprofen) for additional pain control during fracture healing can delay and/or prevent the healing response.  If you would like to take over the counter (OTC) medication, Tylenol (acetaminophen) is ok.  However, some narcotic medications that are given for pain control contain acetaminophen as well. Therefore, you should not exceed more than 4000 mg of tylenol in a day if you do not have liver disease.  Also note that there are may OTC medicines, such as cold medicines and allergy medicines that my contain tylenol as well.  If you have any questions about medications and/or interactions please ask your doctor/PA or your pharmacist.      ICE AND ELEVATE INJURED/OPERATIVE EXTREMITY  Using ice and elevating the injured extremity above your heart can help with swelling and pain control.  Icing in a pulsatile fashion, such as 20 minutes on and 20 minutes off, can be followed.    Do not place ice directly on skin. Make sure there is a barrier between to skin and the ice pack.    Using frozen items such as frozen peas works well as the conform nicely to the are that needs to be iced.  USE AN ACE WRAP OR TED HOSE FOR SWELLING CONTROL  In addition to icing and elevation, Ace wraps or TED hose are used to help limit and resolve swelling.  It is recommended to use Ace wraps or TED hose until you are informed to stop.    When using Ace Wraps start the wrapping distally (farthest away from the body) and wrap proximally (closer to the body)   Example: If you  had surgery on your leg or thing and you do not have a splint on, start the ace wrap at the toes and work your way up to the thigh        If you had surgery on your upper extremity and do not have a splint on, start the ace wrap at your fingers and work your way up to the upper arm  IF YOU ARE IN A SPLINT OR CAST DO NOT REMOVE IT FOR ANY REASON   If your splint gets wet for any reason please contact the office immediately. You may shower in your splint or cast as long as you keep it dry.  This can be done by wrapping in a cast cover or garbage back (or similar)  Do Not stick any thing down your splint or cast such as pencils, money, or hangers to try and scratch yourself with.  If you feel itchy take benadryl as prescribed on the bottle for itching  IF YOU ARE IN A CAM BOOT (BLACK BOOT)  You may remove boot periodically. Perform daily dressing changes as noted below.  Wash the liner of the boot regularly and wear a sock when wearing the boot. It is recommended that you sleep in the boot until told otherwise  CALL THE OFFICE WITH ANY QUESTIONS OR CONCERNS: [phone]
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
## 2016-02-13 NOTE — Anesthesia Postprocedure Evaluation (Signed)
Anesthesia Post Note  Patient: Damon Alexander  Procedure(s) Performed: Procedure(s) (LRB): ARTHRODESIS ANKLE (Left)  Patient location during evaluation: PACU Anesthesia Type: General Level of consciousness: awake and alert Pain management: pain level controlled Vital Signs Assessment: post-procedure vital signs reviewed and stable Respiratory status: spontaneous breathing, nonlabored ventilation, respiratory function stable and patient connected to nasal cannula oxygen Cardiovascular status: blood pressure returned to baseline and stable Postop Assessment: no signs of nausea or vomiting Anesthetic complications: no        Last Vitals:  Vitals:   02/13/16 0053 02/13/16 0531  BP:  136/60  Pulse:  84  Resp: 18 18  Temp:  36.7 C    Last Pain:  Vitals:   02/13/16 0531  TempSrc: Oral  PainSc:    Pain Goal: Patients Stated Pain Goal: 3 (02/12/16 0725)               Damon Alexander

## 2016-02-13 NOTE — Progress Notes (Signed)
RN wasted 15 mL of dilaudid from PCA pump into sink. This was witnessed by Laureen OchsKorie, RCharity fundraiser

## 2016-02-15 ENCOUNTER — Encounter (HOSPITAL_COMMUNITY): Payer: Self-pay | Admitting: Orthopedic Surgery

## 2016-03-06 ENCOUNTER — Encounter (HOSPITAL_COMMUNITY): Payer: Self-pay | Admitting: Orthopedic Surgery

## 2016-03-06 DIAGNOSIS — S82872R Displaced pilon fracture of left tibia, subsequent encounter for open fracture type IIIA, IIIB, or IIIC with malunion: Secondary | ICD-10-CM

## 2016-03-06 DIAGNOSIS — S9302XA Subluxation of left ankle joint, initial encounter: Secondary | ICD-10-CM

## 2016-03-06 HISTORY — DX: Subluxation of left ankle joint, initial encounter: S93.02XA

## 2016-03-06 HISTORY — DX: Displaced pilon fracture of left tibia, subsequent encounter for open fracture type IIIA, IIIB, or IIIC with malunion: S82.872R

## 2016-07-26 ENCOUNTER — Inpatient Hospital Stay (HOSPITAL_COMMUNITY)
Admission: EM | Admit: 2016-07-26 | Discharge: 2016-07-28 | DRG: 857 | Disposition: A | Discharge status: Home / Self Care | Payer: Self-pay | Source: Other Acute Inpatient Hospital | Attending: Orthopedic Surgery | Admitting: Orthopedic Surgery

## 2016-07-26 ENCOUNTER — Encounter (HOSPITAL_COMMUNITY): Payer: Self-pay | Admitting: Internal Medicine

## 2016-07-26 DIAGNOSIS — F17200 Nicotine dependence, unspecified, uncomplicated: Secondary | ICD-10-CM | POA: Diagnosis present

## 2016-07-26 DIAGNOSIS — L02416 Cutaneous abscess of left lower limb: Secondary | ICD-10-CM

## 2016-07-26 DIAGNOSIS — I10 Essential (primary) hypertension: Secondary | ICD-10-CM | POA: Diagnosis present

## 2016-07-26 DIAGNOSIS — Z981 Arthrodesis status: Secondary | ICD-10-CM

## 2016-07-26 DIAGNOSIS — L03116 Cellulitis of left lower limb: Secondary | ICD-10-CM

## 2016-07-26 DIAGNOSIS — Z79899 Other long term (current) drug therapy: Secondary | ICD-10-CM

## 2016-07-26 DIAGNOSIS — L0390 Cellulitis, unspecified: Secondary | ICD-10-CM

## 2016-07-26 DIAGNOSIS — Z9114 Patient's other noncompliance with medication regimen: Secondary | ICD-10-CM

## 2016-07-26 DIAGNOSIS — T814XXA Infection following a procedure, initial encounter: Principal | ICD-10-CM | POA: Diagnosis present

## 2016-07-26 DIAGNOSIS — E876 Hypokalemia: Secondary | ICD-10-CM | POA: Insufficient documentation

## 2016-07-26 DIAGNOSIS — Z881 Allergy status to other antibiotic agents status: Secondary | ICD-10-CM

## 2016-07-26 DIAGNOSIS — D62 Acute posthemorrhagic anemia: Secondary | ICD-10-CM | POA: Diagnosis present

## 2016-07-26 DIAGNOSIS — Z7982 Long term (current) use of aspirin: Secondary | ICD-10-CM

## 2016-07-26 DIAGNOSIS — Z79891 Long term (current) use of opiate analgesic: Secondary | ICD-10-CM

## 2016-07-26 HISTORY — DX: Cellulitis of left lower limb: L03.116

## 2016-07-26 LAB — COMPREHENSIVE METABOLIC PANEL
ALK PHOS: 101 U/L (ref 38–126)
ALT: 11 U/L — AB (ref 17–63)
AST: 18 U/L (ref 15–41)
Albumin: 3.6 g/dL (ref 3.5–5.0)
Anion gap: 7 (ref 5–15)
BILIRUBIN TOTAL: 0.5 mg/dL (ref 0.3–1.2)
BUN: 10 mg/dL (ref 6–20)
CALCIUM: 8.5 mg/dL — AB (ref 8.9–10.3)
CO2: 27 mmol/L (ref 22–32)
CREATININE: 1.03 mg/dL (ref 0.61–1.24)
Chloride: 104 mmol/L (ref 101–111)
GFR calc Af Amer: >60 mL/min (ref >60)
Glucose, Bld: 114 mg/dL — ABNORMAL HIGH (ref 65–99)
Potassium: 3.2 mmol/L — ABNORMAL LOW (ref 3.5–5.1)
Sodium: 138 mmol/L (ref 135–145)
Total Protein: 6.2 g/dL — ABNORMAL LOW (ref 6.5–8.1)

## 2016-07-26 LAB — URINALYSIS, ROUTINE W REFLEX MICROSCOPIC
BILIRUBIN URINE: NEGATIVE
Glucose, UA: NEGATIVE mg/dL
Hgb urine dipstick: NEGATIVE
KETONES UR: NEGATIVE mg/dL
Leukocytes, UA: NEGATIVE
Nitrite: NEGATIVE
Protein, ur: NEGATIVE mg/dL
Specific Gravity, Urine: 1.014 (ref 1.005–1.030)
pH: 6 (ref 5.0–8.0)

## 2016-07-26 LAB — CBC WITH DIFFERENTIAL/PLATELET
Basophils Absolute: 0.1 10*3/uL (ref 0.0–0.1)
Basophils Relative: 0 %
Eosinophils Absolute: 0.4 10*3/uL (ref 0.0–0.7)
Eosinophils Relative: 4 %
HCT: 40.6 % (ref 39.0–52.0)
HEMOGLOBIN: 13.3 g/dL (ref 13.0–17.0)
LYMPHS ABS: 4.3 10*3/uL — AB (ref 0.7–4.0)
LYMPHS PCT: 36 %
MCH: 26 pg (ref 26.0–34.0)
MCHC: 32.8 g/dL (ref 30.0–36.0)
MCV: 79.5 fL (ref 78.0–100.0)
Monocytes Absolute: 1 10*3/uL (ref 0.1–1.0)
Monocytes Relative: 8 %
NEUTROS PCT: 52 %
Neutro Abs: 6.4 10*3/uL (ref 1.7–7.7)
Platelets: 271 10*3/uL (ref 150–400)
RBC: 5.11 MIL/uL (ref 4.22–5.81)
RDW: 16.1 % — ABNORMAL HIGH (ref 11.5–15.5)
WBC: 12.2 10*3/uL — AB (ref 4.0–10.5)

## 2016-07-26 LAB — LACTIC ACID, PLASMA
LACTIC ACID, VENOUS: 0.6 mmol/L (ref 0.5–1.9)
Lactic Acid, Venous: 0.9 mmol/L (ref 0.5–1.9)

## 2016-07-26 LAB — PROTIME-INR
INR: 1.22
PROTHROMBIN TIME: 15.5 s — AB (ref 11.4–15.2)

## 2016-07-26 LAB — TROPONIN I: Troponin I: <0.03 ng/mL (ref <0.03)

## 2016-07-26 LAB — SURGICAL PCR SCREEN
MRSA, PCR: NEGATIVE
Staphylococcus aureus: NEGATIVE

## 2016-07-26 MED ORDER — ONDANSETRON HCL 4 MG PO TABS: 4.0000 mg | ORAL_TABLET | Freq: Four times a day (QID) | ORAL | Status: DC | PRN

## 2016-07-26 MED ORDER — SODIUM CHLORIDE 0.9 % IV SOLN
INTRAVENOUS | Status: DC
  Administered 2016-07-27: 02:00:00 via INTRAVENOUS

## 2016-07-26 MED ORDER — HYDROMORPHONE HCL 1 MG/ML IJ SOLN
0.5000 mg | INTRAMUSCULAR | Status: DC | PRN
  Administered 2016-07-26 (×3): 0.5 mg via INTRAVENOUS
  Filled 2016-07-26 (×4): qty 1

## 2016-07-26 MED ORDER — HYDROCODONE-ACETAMINOPHEN 5-325 MG PO TABS
1.0000 | ORAL_TABLET | ORAL | Status: DC | PRN
  Administered 2016-07-26 – 2016-07-27 (×6): 2 via ORAL
  Filled 2016-07-26 (×6): qty 2

## 2016-07-26 MED ORDER — POTASSIUM CHLORIDE CRYS ER 20 MEQ PO TBCR
40.0000 meq | EXTENDED_RELEASE_TABLET | Freq: Once | ORAL | Status: AC
  Administered 2016-07-26: 40 meq via ORAL
  Filled 2016-07-26: qty 2

## 2016-07-26 MED ORDER — ACETAMINOPHEN 325 MG PO TABS: 650.0000 mg | ORAL_TABLET | Freq: Four times a day (QID) | ORAL | Status: DC | PRN

## 2016-07-26 MED ORDER — DOCUSATE SODIUM 100 MG PO CAPS
100.0000 mg | ORAL_CAPSULE | Freq: Two times a day (BID) | ORAL | Status: DC
  Administered 2016-07-26 – 2016-07-27 (×2): 100 mg via ORAL
  Filled 2016-07-26 (×4): qty 1

## 2016-07-26 MED ORDER — POLYETHYLENE GLYCOL 3350 17 G PO PACK
17.0000 g | PACK | Freq: Every day | ORAL | Status: DC
  Administered 2016-07-26 – 2016-07-27 (×2): 17 g via ORAL
  Filled 2016-07-26 (×2): qty 1

## 2016-07-26 MED ORDER — LORATADINE 10 MG PO TABS: 10.0000 mg | ORAL_TABLET | Freq: Every day | ORAL | Status: DC

## 2016-07-26 MED ORDER — PIPERACILLIN-TAZOBACTAM 3.375 G IVPB
3.3750 g | Freq: Three times a day (TID) | INTRAVENOUS | Status: DC
  Administered 2016-07-26 – 2016-07-28 (×6): 3.375 g via INTRAVENOUS
  Filled 2016-07-26 (×7): qty 50

## 2016-07-26 MED ORDER — ONDANSETRON HCL 4 MG/2ML IJ SOLN: 4.0000 mg | Freq: Four times a day (QID) | INTRAMUSCULAR | Status: DC | PRN

## 2016-07-26 MED ORDER — ASPIRIN EC 325 MG PO TBEC
325.0000 mg | DELAYED_RELEASE_TABLET | Freq: Two times a day (BID) | ORAL | Status: DC
  Administered 2016-07-26 – 2016-07-28 (×5): 325 mg via ORAL
  Filled 2016-07-26 (×5): qty 1

## 2016-07-26 MED ORDER — SODIUM CHLORIDE 0.9 % IV SOLN
INTRAVENOUS | Status: AC
  Administered 2016-07-26: 08:00:00 via INTRAVENOUS

## 2016-07-26 MED ORDER — VANCOMYCIN HCL IN DEXTROSE 1-5 GM/200ML-% IV SOLN
1000.0000 mg | Freq: Three times a day (TID) | INTRAVENOUS | Status: DC
  Administered 2016-07-26 – 2016-07-28 (×7): 1000 mg via INTRAVENOUS
  Filled 2016-07-26 (×8): qty 200

## 2016-07-26 MED ORDER — ACETAMINOPHEN 650 MG RE SUPP: 650.0000 mg | Freq: Four times a day (QID) | RECTAL | Status: DC | PRN

## 2016-07-26 MED ORDER — LORATADINE 10 MG PO TABS
10.0000 mg | ORAL_TABLET | Freq: Every day | ORAL | Status: DC | PRN
  Administered 2016-07-26: 10 mg via ORAL
  Filled 2016-07-26: qty 1

## 2016-07-26 MED ORDER — METHOCARBAMOL 500 MG PO TABS
1000.0000 mg | ORAL_TABLET | Freq: Four times a day (QID) | ORAL | Status: DC
  Administered 2016-07-26 (×4): 1000 mg via ORAL
  Filled 2016-07-26 (×4): qty 2

## 2016-07-26 NOTE — Anesthesia Preprocedure Evaluation (Addendum)
Anesthesia Evaluation  Patient identified by MRN, date of birth, ID band Patient awake    Reviewed: Allergy & Precautions, NPO status , Patient's Chart, lab work & pertinent test results  Airway Mallampati: III  TM Distance: >3 FB Neck ROM: Full    Dental  (+) Teeth Intact, Dental Advisory Given   Pulmonary Current Smoker,    breath sounds clear to auscultation       Cardiovascular hypertension,  Rhythm:Regular Rate:Normal     Neuro/Psych Seizures -,  PSYCHIATRIC DISORDERS    GI/Hepatic negative GI ROS, Neg liver ROS,   Endo/Other  negative endocrine ROS  Renal/GU negative Renal ROS  negative genitourinary   Musculoskeletal negative musculoskeletal ROS (+)   Abdominal   Peds negative pediatric ROS (+)  Hematology negative hematology ROS (+)   Anesthesia Other Findings Day of surgery medications reviewed with the patient.  Reproductive/Obstetrics negative OB ROS                            Lab Results  Component Value Date   WBC 8.9 07/27/2016   HGB 13.9 07/27/2016   HCT 42.8 07/27/2016   MCV 79.7 07/27/2016   PLT 285 07/27/2016   Lab Results  Component Value Date   CREATININE 0.99 07/27/2016   BUN 6 07/27/2016   NA 139 07/27/2016   K 3.8 07/27/2016   CL 103 07/27/2016   CO2 31 07/27/2016   Lab Results  Component Value Date   INR 1.22 07/26/2016   INR 1.21 02/12/2016   INR 1.17 10/05/2015   07/2016 EKG: normal sinus rhythm.   Anesthesia Physical Anesthesia Plan  ASA: II  Anesthesia Plan: General   Post-op Pain Management:    Induction: Intravenous  PONV Risk Score and Plan: 2 and Ondansetron and Dexamethasone  Airway Management Planned: LMA  Additional Equipment:   Intra-op Plan:   Post-operative Plan: Extubation in OR  Informed Consent: I have reviewed the patients History and Physical, chart, labs and discussed the procedure including the risks, benefits  and alternatives for the proposed anesthesia with the patient or authorized representative who has indicated his/her understanding and acceptance.   Dental advisory given  Plan Discussed with: CRNA  Anesthesia Plan Comments:         Anesthesia Quick Evaluation

## 2016-07-26 NOTE — Progress Notes (Signed)
Pharmacy Antibiotic Note  Emilia BeckSamuel D Stumpe is a 31 y.o. male admitted on 07/26/2016 with cellulitis.  Pharmacy has been consulted for Vancomycin/Zosyn dosing. Recent hx of orthopedic surgery. [facility] with LLE redness. WBC 12.2. Scr 0.99 at outside hospital. Vancomycin 1500 mg IV x 1 and Zosyn 3.375g IV x 1 given at outside hospital.   Plan: Vancomycin 1000 mg IV q8h Zosyn 3.375G IV q8h to be infused over 4 hours Trend WBC, temp, renal function  F/U infectious work-up Drug levels as indicated  Temp (24hrs), Avg:98.3 F (36.8 C), Min:98.3 F (36.8 C), Max:98.3 F (36.8 C)   Recent Labs Lab 07/26/16 0611  WBC 12.2*    CrCl cannot be calculated (Patient's most recent lab result is older than the maximum 21 days allowed.).    Allergies  Allergen Reactions  . Ciprofloxacin Other (See Comments)    Seizure     [name], Navea Woodrow 07/26/2016 7:03 AM
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
## 2016-07-27 ENCOUNTER — Inpatient Hospital Stay (HOSPITAL_COMMUNITY): Payer: Self-pay | Admitting: Anesthesiology

## 2016-07-27 ENCOUNTER — Encounter (HOSPITAL_COMMUNITY)
Admission: EM | Disposition: A | Discharge status: Home / Self Care | Payer: Self-pay | Source: Other Acute Inpatient Hospital | Attending: Family Medicine

## 2016-07-27 ENCOUNTER — Encounter (HOSPITAL_COMMUNITY): Payer: Self-pay | Admitting: Anesthesiology

## 2016-07-27 DIAGNOSIS — T847XXS Infection and inflammatory reaction due to other internal orthopedic prosthetic devices, implants and grafts, sequela: Secondary | ICD-10-CM

## 2016-07-27 DIAGNOSIS — I10 Essential (primary) hypertension: Secondary | ICD-10-CM

## 2016-07-27 HISTORY — PX: I & D EXTREMITY: SHX5045

## 2016-07-27 LAB — BASIC METABOLIC PANEL
ANION GAP: 5 (ref 5–15)
BUN: 6 mg/dL (ref 6–20)
CHLORIDE: 103 mmol/L (ref 101–111)
CO2: 31 mmol/L (ref 22–32)
Calcium: 9.1 mg/dL (ref 8.9–10.3)
Creatinine, Ser: 0.99 mg/dL (ref 0.61–1.24)
GFR calc Af Amer: >60 mL/min (ref >60)
GFR calc non Af Amer: >60 mL/min (ref >60)
GLUCOSE: 76 mg/dL (ref 65–99)
POTASSIUM: 3.8 mmol/L (ref 3.5–5.1)
Sodium: 139 mmol/L (ref 135–145)

## 2016-07-27 LAB — CBC
HEMATOCRIT: 42.8 % (ref 39.0–52.0)
HEMOGLOBIN: 13.9 g/dL (ref 13.0–17.0)
MCH: 25.9 pg — AB (ref 26.0–34.0)
MCHC: 32.5 g/dL (ref 30.0–36.0)
MCV: 79.7 fL (ref 78.0–100.0)
Platelets: 285 10*3/uL (ref 150–400)
RBC: 5.37 MIL/uL (ref 4.22–5.81)
RDW: 16 % — ABNORMAL HIGH (ref 11.5–15.5)
WBC: 8.9 10*3/uL (ref 4.0–10.5)

## 2016-07-27 LAB — HIV ANTIBODY (ROUTINE TESTING W REFLEX): HIV Screen 4th Generation wRfx: NONREACTIVE

## 2016-07-27 SURGERY — IRRIGATION AND DEBRIDEMENT EXTREMITY
Anesthesia: General | Site: Leg Lower | Laterality: Left

## 2016-07-27 MED ORDER — PROPOFOL 10 MG/ML IV BOLUS
INTRAVENOUS | Status: DC | PRN
Start: 2016-07-27 — End: 2016-07-27
  Administered 2016-07-27: 200 mg via INTRAVENOUS

## 2016-07-27 MED ORDER — METOCLOPRAMIDE HCL 5 MG/ML IJ SOLN: 5.0000 mg | Freq: Three times a day (TID) | INTRAMUSCULAR | Status: DC | PRN

## 2016-07-27 MED ORDER — FENTANYL CITRATE (PF) 250 MCG/5ML IJ SOLN
INTRAMUSCULAR | Status: AC
  Filled 2016-07-27: qty 5

## 2016-07-27 MED ORDER — CHLORHEXIDINE GLUCONATE 4 % EX LIQD
60.0000 mL | Freq: Once | CUTANEOUS | Status: AC
  Administered 2016-07-27: 4 via TOPICAL
  Filled 2016-07-27: qty 60

## 2016-07-27 MED ORDER — PROPOFOL 10 MG/ML IV BOLUS
INTRAVENOUS | Status: AC
  Filled 2016-07-27: qty 40

## 2016-07-27 MED ORDER — HYDROMORPHONE HCL 1 MG/ML IJ SOLN
0.2500 mg | INTRAMUSCULAR | Status: DC | PRN
  Administered 2016-07-27 (×2): 0.5 mg via INTRAVENOUS

## 2016-07-27 MED ORDER — MIDAZOLAM HCL 5 MG/5ML IJ SOLN
INTRAMUSCULAR | Status: DC | PRN
Start: 2016-07-27 — End: 2016-07-27
  Administered 2016-07-27: 2 mg via INTRAVENOUS

## 2016-07-27 MED ORDER — METOCLOPRAMIDE HCL 5 MG PO TABS: 5.0000 mg | ORAL_TABLET | Freq: Three times a day (TID) | ORAL | Status: DC | PRN

## 2016-07-27 MED ORDER — LACTATED RINGERS IV SOLN: INTRAVENOUS | Status: DC

## 2016-07-27 MED ORDER — LACTATED RINGERS IV SOLN
INTRAVENOUS | Status: DC | PRN
  Administered 2016-07-27: 07:00:00 via INTRAVENOUS

## 2016-07-27 MED ORDER — ONDANSETRON HCL 4 MG/2ML IJ SOLN
INTRAMUSCULAR | Status: DC | PRN
  Administered 2016-07-27: 4 mg via INTRAVENOUS

## 2016-07-27 MED ORDER — MEPERIDINE HCL 25 MG/ML IJ SOLN: 6.2500 mg | INTRAMUSCULAR | Status: DC | PRN

## 2016-07-27 MED ORDER — OXYCODONE HCL 5 MG PO TABS
5.0000 mg | ORAL_TABLET | ORAL | Status: DC | PRN
  Administered 2016-07-27 – 2016-07-28 (×4): 10 mg via ORAL
  Filled 2016-07-27 (×5): qty 2

## 2016-07-27 MED ORDER — DEXAMETHASONE SODIUM PHOSPHATE 10 MG/ML IJ SOLN
INTRAMUSCULAR | Status: DC | PRN
  Administered 2016-07-27: 4 mg via INTRAVENOUS

## 2016-07-27 MED ORDER — MIDAZOLAM HCL 2 MG/2ML IJ SOLN
INTRAMUSCULAR | Status: AC
  Filled 2016-07-27: qty 2

## 2016-07-27 MED ORDER — SODIUM CHLORIDE 0.9 % IR SOLN
Status: DC | PRN
  Administered 2016-07-27: 3000 mL

## 2016-07-27 MED ORDER — 0.9 % SODIUM CHLORIDE (POUR BTL) OPTIME
TOPICAL | Status: DC | PRN
  Administered 2016-07-27: 1000 mL

## 2016-07-27 MED ORDER — LIDOCAINE 2% (20 MG/ML) 5 ML SYRINGE
INTRAMUSCULAR | Status: AC
  Filled 2016-07-27: qty 5

## 2016-07-27 MED ORDER — METHOCARBAMOL 500 MG PO TABS
500.0000 mg | ORAL_TABLET | Freq: Four times a day (QID) | ORAL | Status: DC | PRN
  Administered 2016-07-27 – 2016-07-28 (×4): 500 mg via ORAL
  Filled 2016-07-27 (×4): qty 1

## 2016-07-27 MED ORDER — ONDANSETRON HCL 4 MG/2ML IJ SOLN
INTRAMUSCULAR | Status: AC
  Filled 2016-07-27: qty 2

## 2016-07-27 MED ORDER — LIDOCAINE HCL (CARDIAC) 20 MG/ML IV SOLN
INTRAVENOUS | Status: DC | PRN
  Administered 2016-07-27: 60 mg via INTRAVENOUS

## 2016-07-27 MED ORDER — ACETAMINOPHEN 325 MG PO TABS: 650.0000 mg | ORAL_TABLET | Freq: Four times a day (QID) | ORAL | Status: DC | PRN

## 2016-07-27 MED ORDER — ACETAMINOPHEN 650 MG RE SUPP: 650.0000 mg | Freq: Four times a day (QID) | RECTAL | Status: DC | PRN

## 2016-07-27 MED ORDER — HYDROMORPHONE HCL 1 MG/ML IJ SOLN
0.5000 mg | INTRAMUSCULAR | Status: DC | PRN
  Administered 2016-07-27 – 2016-07-28 (×6): 1 mg via INTRAVENOUS
  Filled 2016-07-27 (×6): qty 1

## 2016-07-27 MED ORDER — HYDROMORPHONE HCL 1 MG/ML IJ SOLN
INTRAMUSCULAR | Status: AC
  Administered 2016-07-27: 0.5 mg via INTRAVENOUS
  Filled 2016-07-27: qty 1

## 2016-07-27 MED ORDER — DEXAMETHASONE SODIUM PHOSPHATE 10 MG/ML IJ SOLN
INTRAMUSCULAR | Status: AC
  Filled 2016-07-27: qty 1

## 2016-07-27 MED ORDER — FENTANYL CITRATE (PF) 100 MCG/2ML IJ SOLN
INTRAMUSCULAR | Status: DC | PRN
  Administered 2016-07-27 (×2): 50 ug via INTRAVENOUS

## 2016-07-27 MED ORDER — ACETAMINOPHEN 500 MG PO TABS
1000.0000 mg | ORAL_TABLET | Freq: Three times a day (TID) | ORAL | Status: DC
  Administered 2016-07-27 – 2016-07-28 (×4): 1000 mg via ORAL
  Filled 2016-07-27 (×4): qty 2

## 2016-07-27 MED ORDER — PROMETHAZINE HCL 25 MG/ML IJ SOLN: 6.2500 mg | INTRAMUSCULAR | Status: DC | PRN

## 2016-07-27 SURGICAL SUPPLY — 52 items
BANDAGE ACE 4X5 VEL STRL LF (GAUZE/BANDAGES/DRESSINGS) ×2 IMPLANT
BANDAGE ACE 6X5 VEL STRL LF (GAUZE/BANDAGES/DRESSINGS) ×2 IMPLANT
BANDAGE ELASTIC 6 VELCRO ST LF (GAUZE/BANDAGES/DRESSINGS) ×2 IMPLANT
BANDAGE ESMARK 6X9 LF (GAUZE/BANDAGES/DRESSINGS) IMPLANT
BLADE SURG 10 STRL SS (BLADE) ×2 IMPLANT
BNDG COHESIVE 4X5 TAN STRL (GAUZE/BANDAGES/DRESSINGS) ×2 IMPLANT
BNDG ESMARK 4X9 LF (GAUZE/BANDAGES/DRESSINGS) IMPLANT
BNDG ESMARK 6X9 LF (GAUZE/BANDAGES/DRESSINGS)
BNDG GAUZE ELAST 4 BULKY (GAUZE/BANDAGES/DRESSINGS) ×2 IMPLANT
CONT SPEC 4OZ CLIKSEAL STRL BL (MISCELLANEOUS) IMPLANT
COVER SURGICAL LIGHT HANDLE (MISCELLANEOUS) ×2 IMPLANT
CUFF TOURN SGL LL 12 NO SLV (MISCELLANEOUS) IMPLANT
CUFF TOURNIQUET SINGLE 34IN LL (TOURNIQUET CUFF) IMPLANT
DRAPE SURG 17X23 STRL (DRAPES) IMPLANT
DRAPE U-SHAPE 47X51 STRL (DRAPES) IMPLANT
DRESSING ADAPTIC 1/2  N-ADH (PACKING) ×2 IMPLANT
DRSG PAD ABDOMINAL 8X10 ST (GAUZE/BANDAGES/DRESSINGS) ×2 IMPLANT
DURAPREP 26ML APPLICATOR (WOUND CARE) ×2 IMPLANT
ELECT REM PT RETURN 9FT ADLT (ELECTROSURGICAL)
ELECTRODE REM PT RTRN 9FT ADLT (ELECTROSURGICAL) IMPLANT
EVACUATOR 1/8 PVC DRAIN (DRAIN) IMPLANT
FACESHIELD WRAPAROUND (MASK) IMPLANT
GAUZE PACKING IODOFORM 1/4X15 (GAUZE/BANDAGES/DRESSINGS) ×2 IMPLANT
GAUZE SPONGE 4X4 12PLY STRL (GAUZE/BANDAGES/DRESSINGS) ×2 IMPLANT
GAUZE SPONGE 4X4 12PLY STRL LF (GAUZE/BANDAGES/DRESSINGS) ×2 IMPLANT
GAUZE XEROFORM 1X8 LF (GAUZE/BANDAGES/DRESSINGS) ×2 IMPLANT
GLOVE BIO SURGEON STRL SZ7.5 (GLOVE) ×4 IMPLANT
GLOVE BIOGEL PI IND STRL 8 (GLOVE) ×2 IMPLANT
GLOVE BIOGEL PI INDICATOR 8 (GLOVE) ×2
GOWN STRL REUS W/ TWL LRG LVL3 (GOWN DISPOSABLE) ×3 IMPLANT
GOWN STRL REUS W/TWL LRG LVL3 (GOWN DISPOSABLE) ×3
HANDPIECE INTERPULSE COAX TIP (DISPOSABLE)
KIT BASIN OR (CUSTOM PROCEDURE TRAY) ×2 IMPLANT
KIT ROOM TURNOVER OR (KITS) ×2 IMPLANT
MANIFOLD NEPTUNE II (INSTRUMENTS) ×2 IMPLANT
NEEDLE 25GAX1.5 (MISCELLANEOUS) IMPLANT
NS IRRIG 1000ML POUR BTL (IV SOLUTION) ×2 IMPLANT
PACK ORTHO EXTREMITY (CUSTOM PROCEDURE TRAY) ×2 IMPLANT
PAD ARMBOARD 7.5X6 YLW CONV (MISCELLANEOUS) ×4 IMPLANT
SET HNDPC FAN SPRY TIP SCT (DISPOSABLE) IMPLANT
SPONGE LAP 18X18 X RAY DECT (DISPOSABLE) IMPLANT
STOCKINETTE IMPERVIOUS 9X36 MD (GAUZE/BANDAGES/DRESSINGS) ×2 IMPLANT
SUT ETHILON 3 0 PS 1 (SUTURE) IMPLANT
SUT PDS AB 2-0 CT1 27 (SUTURE) IMPLANT
SWAB CULTURE ESWAB REG 1ML (MISCELLANEOUS) IMPLANT
SYR CONTROL 10ML LL (SYRINGE) IMPLANT
TOWEL OR 17X24 6PK STRL BLUE (TOWEL DISPOSABLE) ×2 IMPLANT
TOWEL OR 17X26 10 PK STRL BLUE (TOWEL DISPOSABLE) ×2 IMPLANT
TUBE CONNECTING 12X1/4 (SUCTIONS) ×2 IMPLANT
TUBING CYSTO DISP (UROLOGICAL SUPPLIES) IMPLANT
UNDERPAD 30X30 (UNDERPADS AND DIAPERS) ×2 IMPLANT
YANKAUER SUCT BULB TIP NO VENT (SUCTIONS) ×2 IMPLANT
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
## 2016-07-27 NOTE — Transfer of Care (Signed)
Immediate Anesthesia Transfer of Care Note  Patient: Damon FolksSamuel D Younis  Procedure(s) Performed: Procedure(s): IRRIGATION AND DEBRIDEMENT EXTREMITY (Left)  Patient Location: PACU  Anesthesia Type:General  Level of Consciousness: awake, oriented and patient cooperative  Airway & Oxygen Therapy: Patient Spontanous Breathing  Post-op Assessment: Report given to RN  Post vital signs: Reviewed and stable  Last Vitals:  Vitals:   07/26/16 2014 07/27/16 0457  BP: 138/75 140/74  Pulse: 72 66  Resp:    Temp: 36.9 C 36.8 C    Last Pain:  Vitals:   07/27/16 0559  TempSrc:   PainSc: 9       Patients Stated Pain Goal: 0 (07/26/16 [id number])  Complications: No apparent anesthesia complications

## 2016-07-27 NOTE — Anesthesia Procedure Notes (Signed)
Procedure Name: LMA Insertion Date/Time: 07/27/2016 7:37 AM Performed by: Romie MinusOCK, Jourdyn Hasler K Pre-anesthesia Checklist: Patient identified, Emergency Drugs available, Suction available and Patient being monitored Patient Re-evaluated:Patient Re-evaluated prior to inductionOxygen Delivery Method: Circle System Utilized Preoxygenation: Pre-oxygenation with 100% oxygen Intubation Type: IV induction Ventilation: Mask ventilation without difficulty LMA: LMA inserted LMA Size: 4.0 Number of attempts: 1 Airway Equipment and Method: Bite block Placement Confirmation: positive ETCO2 and breath sounds checked- equal and bilateral Tube secured with: Tape Dental Injury: Teeth and Oropharynx as per pre-operative assessment

## 2016-07-27 NOTE — Op Note (Signed)
07/26/2016 - 07/27/2016  9:43 AM  PATIENT:  Damon Alexander    PRE-OPERATIVE DIAGNOSIS:  left leg abscess  POST-OPERATIVE DIAGNOSIS:  Same  PROCEDURE:  IRRIGATION AND DEBRIDEMENT EXTREMITY  SURGEON:  Aragorn Recker, Jewel BaizeIMOTHY D, MD  ASSISTANT: Aquilla HackerHenry Martensen, PA-C, he was present and scrubbed throughout the case, critical for completion in a timely fashion, and for retraction, instrumentation, and closure.   ANESTHESIA:   gen  PREOPERATIVE INDICATIONS:  Damon Alexander is a  31 y.o. male with a diagnosis of left leg abscess who failed conservative measures and elected for surgical management.    The risks benefits and alternatives were discussed with the patient preoperatively including but not limited to the risks of infection, bleeding, nerve injury, cardiopulmonary complications, the need for revision surgery, among others, and the patient was willing to proceed.  OPERATIVE IMPLANTS: none  OPERATIVE FINDINGS: purulent fluid, not intra-canal communication  BLOOD LOSS: 50  COMPLICATIONS: none  TOURNIQUET TIME: none  OPERATIVE PROCEDURE:  Patient was identified in the preoperative holding area and site was marked by me He was transported to the operating theater and placed on the table in supine position taking care to pad all bony prominences. After a preincinduction time out anesthesia was induced. The left lower extremity was prepped and draped in normal sterile fashion and a pre-incision timeout was performed. He received scheduled abx for preoperative antibiotics.   I made a longitudinal incision over his palpable abscess immediate purulent fluid was expressed and collected for culture.  This communicated with a small abscess in his anterior pretibial region and down to his palpable pin site with there was still a defect in his tibia.  I curetted out this area it did not communicate and interosseous space I probed it with the curet as well as a smaller Adson and did not feel any  communication to the interosseous space.  I debrided any necrotic tissue this was an excisional debridement with a curette I then irrigated it with 3 L of saline I explored the wound again there was no additional necrotic tissue I placed a packing and loosely closed it. Sterile dressing was applied he was awoken and taken the PACU in stable condition  POST OPERATIVE PLAN: Weightbearing as tolerated dressing change in the next few days to remove packing will turned over care to Dr. handy and his team on Monday

## 2016-07-27 NOTE — Anesthesia Postprocedure Evaluation (Signed)
Anesthesia Post Note  Patient: Damon FolksSamuel D Alexander  Procedure(s) Performed: Procedure(s) (LRB): IRRIGATION AND DEBRIDEMENT EXTREMITY (Left)     Patient location during evaluation: PACU Anesthesia Type: General Level of consciousness: awake and alert Pain management: pain level controlled Vital Signs Assessment: post-procedure vital signs reviewed and stable Respiratory status: spontaneous breathing, nonlabored ventilation, respiratory function stable and patient connected to nasal cannula oxygen Cardiovascular status: blood pressure returned to baseline and stable Postop Assessment: no signs of nausea or vomiting Anesthetic complications: no    Last Vitals:  Vitals:   07/27/16 0845 07/27/16 0857  BP:  (!) 144/94  Pulse: 68 66  Resp: 12 13  Temp: 36.6 C     Last Pain:  Vitals:   07/27/16 0845  TempSrc:   PainSc: 3                  Shelton SilvasKevin D Nikol Lemar

## 2016-07-27 NOTE — Interval H&P Note (Signed)
History and Physical Interval Note:  07/27/2016 7:23 AM  Damon FolksSamuel D Zheng  has presented today for surgery, with the diagnosis of left leg abscess  The various methods of treatment have been discussed with the patient and family. After consideration of risks, benefits and other options for treatment, the patient has consented to  Procedure(s): IRRIGATION AND DEBRIDEMENT EXTREMITY (Left) as a surgical intervention .  The patient's history has been reviewed, patient examined, no change in status, stable for surgery.  I have reviewed the patient's chart and labs.  Questions were answered to the patient's satisfaction.     [name]

## 2016-07-28 ENCOUNTER — Encounter (HOSPITAL_COMMUNITY): Payer: Self-pay | Admitting: Orthopedic Surgery

## 2016-07-28 DIAGNOSIS — L02416 Cutaneous abscess of left lower limb: Secondary | ICD-10-CM | POA: Diagnosis present

## 2016-07-28 MED ORDER — TETRACYCLINE HCL 500 MG PO CAPS: 500.0000 mg | ORAL_CAPSULE | Freq: Two times a day (BID) | ORAL | 0 refills | Status: AC

## 2016-07-28 MED ORDER — CEFADROXIL 500 MG PO CAPS: 500.0000 mg | ORAL_CAPSULE | Freq: Two times a day (BID) | ORAL | 0 refills | Status: AC

## 2016-07-28 MED ORDER — TETRACYCLINE HCL 250 MG PO CAPS
500.0000 mg | ORAL_CAPSULE | Freq: Two times a day (BID) | ORAL | Status: DC
  Filled 2016-07-28: qty 2

## 2016-07-28 MED ORDER — DOXYCYCLINE HYCLATE 100 MG PO TABS
100.0000 mg | ORAL_TABLET | Freq: Two times a day (BID) | ORAL | Status: DC
  Administered 2016-07-28: 100 mg via ORAL
  Filled 2016-07-28: qty 1

## 2016-07-28 MED ORDER — CEFADROXIL 500 MG PO CAPS
500.0000 mg | ORAL_CAPSULE | Freq: Two times a day (BID) | ORAL | Status: DC
  Filled 2016-07-28: qty 1

## 2016-07-28 MED ORDER — CEPHALEXIN 500 MG PO CAPS
500.0000 mg | ORAL_CAPSULE | Freq: Two times a day (BID) | ORAL | Status: DC
  Administered 2016-07-28: 500 mg via ORAL
  Filled 2016-07-28: qty 1

## 2016-07-28 MED ORDER — HYDROCODONE-ACETAMINOPHEN 5-325 MG PO TABS: 1.0000 | ORAL_TABLET | Freq: Three times a day (TID) | ORAL | 0 refills | Status: DC | PRN

## 2016-07-28 NOTE — Discharge Instructions (Signed)
[facility] Discharge Instructions   General Discharge Instructions  WEIGHT BEARING STATUS: weightbearing as tolerated Left leg   RANGE OF MOTION/ACTIVITY: as tolerated   Wound Care: daily wound care. Daily dressing changes, daily soapy soaks of L leg in warm water.     Discharge Wound Care Instructions  Do NOT apply any ointments, solutions or lotions to pin sites or surgical wounds.  These prevent needed drainage and even though solutions like hydrogen peroxide kill bacteria, they also damage cells lining the pin sites that help fight infection.  Applying lotions or ointments can keep the wounds moist and can cause them to breakdown and open up as well. This can increase the risk for infection. When in doubt call the office.  Surgical incisions should be dressed daily.  If any drainage is noted, use one layer of adaptic, then gauze, Kerlix, and an ace wrap.  Once the incision is completely dry and without drainage, it may be left open to air out.  Showering may begin 36-48 hours later.  Cleaning gently with soap and water.  Traumatic wounds should be dressed daily as well.    One layer of adaptic, gauze, Kerlix, then ace wrap.  The adaptic can be discontinued once the draining has ceased    If you have a wet to dry dressing: wet the gauze with saline the squeeze as much saline out so the gauze is moist (not soaking wet), place moistened gauze over wound, then place a dry gauze over the moist one, followed by Kerlix wrap, then ace wrap.  PAIN MEDICATION USE AND EXPECTATIONS  You have likely been given narcotic medications to help control your pain.  After a traumatic event that results in an fracture (broken bone) with or without surgery, it is ok to use narcotic pain medications to help control one's pain.  We understand that everyone responds to pain differently and each individual patient will be evaluated on a regular basis for the continued need for narcotic  medications. Ideally, narcotic medication use should last no more than 6-8 weeks (coinciding with fracture healing).   As a patient it is your responsibility as well to monitor narcotic medication use and report the amount and frequency you use these medications when you come to your office visit.   We would also advise that if you are using narcotic medications, you should take a dose prior to therapy to maximize you participation.  IF YOU ARE ON NARCOTIC MEDICATIONS IT IS NOT PERMISSIBLE TO OPERATE A MOTOR VEHICLE (MOTORCYCLE/CAR/TRUCK/MOPED) OR HEAVY MACHINERY DO NOT MIX NARCOTICS WITH OTHER CNS (CENTRAL NERVOUS SYSTEM) DEPRESSANTS SUCH AS ALCOHOL  Diet: as you were eating previously.  Can use over the counter stool softeners and bowel preparations, such as Miralax, to help with bowel movements.  Narcotics can be constipating.  Be sure to drink plenty of fluids    STOP SMOKING OR USING NICOTINE PRODUCTS!!!!  As discussed nicotine severely impairs your body's ability to heal surgical and traumatic wounds but also impairs bone healing.  Wounds and bone heal by forming microscopic blood vessels (angiogenesis) and nicotine is a vasoconstrictor (essentially, shrinks blood vessels).  Therefore, if vasoconstriction occurs to these microscopic blood vessels they essentially disappear and are unable to deliver necessary nutrients to the healing tissue.  This is one modifiable factor that you can do to dramatically increase your chances of healing your injury.    (This means no smoking, no nicotine gum, patches, etc)  DO NOT USE NONSTEROIDAL ANTI-INFLAMMATORY  DRUGS (NSAID'S)  Using products such as Advil (ibuprofen), Aleve (naproxen), Motrin (ibuprofen) for additional pain control during fracture healing can delay and/or prevent the healing response.  If you would like to take over the counter (OTC) medication, Tylenol (acetaminophen) is ok.  However, some narcotic medications that are given for pain  control contain acetaminophen as well. Therefore, you should not exceed more than 4000 mg of tylenol in a day if you do not have liver disease.  Also note that there are may OTC medicines, such as cold medicines and allergy medicines that my contain tylenol as well.  If you have any questions about medications and/or interactions please ask your doctor/PA or your pharmacist.      ICE AND ELEVATE INJURED/OPERATIVE EXTREMITY  Using ice and elevating the injured extremity above your heart can help with swelling and pain control.  Icing in a pulsatile fashion, such as 20 minutes on and 20 minutes off, can be followed.    Do not place ice directly on skin. Make sure there is a barrier between to skin and the ice pack.    Using frozen items such as frozen peas works well as the conform nicely to the are that needs to be iced.  USE AN ACE WRAP OR TED HOSE FOR SWELLING CONTROL  In addition to icing and elevation, Ace wraps or TED hose are used to help limit and resolve swelling.  It is recommended to use Ace wraps or TED hose until you are informed to stop.    When using Ace Wraps start the wrapping distally (farthest away from the body) and wrap proximally (closer to the body)   Example: If you had surgery on your leg or thing and you do not have a splint on, start the ace wrap at the toes and work your way up to the thigh        If you had surgery on your upper extremity and do not have a splint on, start the ace wrap at your fingers and work your way up to the upper arm  IF YOU ARE IN A SPLINT OR CAST DO NOT REMOVE IT FOR ANY REASON   If your splint gets wet for any reason please contact the office immediately. You may shower in your splint or cast as long as you keep it dry.  This can be done by wrapping in a cast cover or garbage back (or similar)  Do Not stick any thing down your splint or cast such as pencils, money, or hangers to try and scratch yourself with.  If you feel itchy take benadryl as  prescribed on the bottle for itching  IF YOU ARE IN A CAM BOOT (BLACK BOOT)  You may remove boot periodically. Perform daily dressing changes as noted below.  Wash the liner of the boot regularly and wear a sock when wearing the boot. It is recommended that you sleep in the boot until told otherwise  CALL THE OFFICE WITH ANY QUESTIONS OR CONCERNS: [phone]

## 2016-07-28 NOTE — Progress Notes (Signed)
[facility] Trauma Service Progress Note    Subjective:  Doing well No complaints Up walking in room  States leg feels much better    ROS As above    Objective:   VITALS:   Vitals:   07/27/16 1500 07/27/16 2114 07/28/16 0705 07/28/16 0846  BP: (!) 146/84 (!) 147/84 (!) 145/82 (!) 146/77  Pulse: 74 68 72 71  Resp: 16 16 16 18   Temp: 98.2 F (36.8 C) 97.9 F (36.6 C) 98 F (36.7 C) 98.4 F (36.9 C)  TempSrc: Oral Oral Oral Oral  SpO2: 95% 100%  98%  Weight:      Height:        Intake/Output      06/24 0701 - 06/25 0700 06/25 0701 - 06/26 0700   P.O. 960    I.V. (mL/kg) 600 (5.5)    IV Piggyback 750    Total Intake(mL/kg) 2310 (21.3)    Urine (mL/kg/hr) 350 (0.1)    Stool 0 (0)    Blood 5 (0)    Total Output 355     Net +1955          Urine Occurrence 5 x    Stool Occurrence 5 x      LABS  Aerobic/Anaerobic Culture (surgical/deep wound)  Order: [id number]  Status:  Preliminary result   Visible to patient:  No (Not Released) Next appt:  None    1d ago   Specimen Description ABSCESS LEFT LEG   Special Requests NONE   Gram Stain ABUNDANT WBC PRESENT, PREDOMINANTLY PMN  FEW GRAM POSITIVE COCCI IN CLUSTERS  Gram Stain Report Called to,Read Back By and Verified With: K Laneka Mcgrory,MD AT 1041 07/28/16 BY L BENFIELD      Culture PENDING   Report Status PENDING   Resulting Agency [facility]    Specimen Collected: 07/27/16 08:00 Last Resulted: 07/28/16 10:36                  PHYSICAL EXAM:   Gen: awake and alert, NAD, appears well Ext:       Left Lower Extremity   Dressing removed  Incision over previous proximal tibial pinsite looks good  Packing removed as well  No active purulence appreciated  Ext warm  + DP pulse  Distal motor and sensory functions intact   Swelling well controlled   Assessment/Plan: 1 Day Post-Op   Principal Problem:   Abscess of left leg Active Problems:   Nicotine dependence   HTN (hypertension)  Cellulitis   Anti-infectives    Start     Dose/Rate Route Frequency Ordered Stop   07/26/16 1200  vancomycin (VANCOCIN) IVPB 1000 mg/200 mL premix     1,000 mg 200 mL/hr over 60 Minutes Intravenous Every 8 hours 07/26/16 0707     07/26/16 1000  piperacillin-tazobactam (ZOSYN) IVPB 3.375 g     3.375 g 12.5 mL/hr over 240 Minutes Intravenous Every 8 hours 07/26/16 0707      .  POD/HD#: 1  31 y/o male with L leg abscess s/p I&D   -L leg abscess with communication to bone but not intramedullary canal s/p I&D  Dressing changed today  Daily dressing changes along with soapy water soaks x 10-15 min  PO abx to include duricef and tetracycline   Will see if we can get medication assistance to ensure pt will get appropriate quantity of meds  Anticipate 4-6 weeks of therapy   - Pain management:  Continue with current regimen   - ABL anemia/Hemodynamics  Stable  Chronic htn, noncompliant with meds  - ID:   Dc IV abx  Po abx    Tetracycline 500 mg po q12h    duricef 500 mg po q12h   Follow up on finalized cultures  - Activity:  WBAT L leg   - FEN/GI prophylaxis/Foley/Lines:  Reg diet  - Dispo:  Dc home today   Follow up in 10 days    Mearl Latin, PA-C [facility] [phone] (P) [phone] (O) 07/28/2016, 12:04 PM

## 2016-07-28 NOTE — Progress Notes (Signed)
Pt ready for d/c home today per MD. Reviewed discharge instructions and prescriptions with pt, he denied questions. He also did not want to wait to see CM for medication assistance. [facility].   [place], Latricia HeftKorie G
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
## 2016-07-31 LAB — CULTURE, BLOOD (ROUTINE X 2)
Culture: NO GROWTH
Culture: NO GROWTH
Special Requests: ADEQUATE

## 2016-08-02 LAB — AEROBIC/ANAEROBIC CULTURE W GRAM STAIN (SURGICAL/DEEP WOUND)

## 2016-08-02 LAB — AEROBIC/ANAEROBIC CULTURE (SURGICAL/DEEP WOUND)

## 2016-08-18 ENCOUNTER — Other Ambulatory Visit (HOSPITAL_COMMUNITY): Payer: Self-pay | Admitting: Orthopedic Surgery

## 2016-08-18 DIAGNOSIS — S82872N Displaced pilon fracture of left tibia, subsequent encounter for open fracture type IIIA, IIIB, or IIIC with nonunion: Secondary | ICD-10-CM

## 2016-08-22 ENCOUNTER — Ambulatory Visit (HOSPITAL_COMMUNITY): Payer: Self-pay | Attending: Orthopedic Surgery

## 2016-08-31 ENCOUNTER — Encounter (HOSPITAL_COMMUNITY): Payer: Self-pay | Admitting: Emergency Medicine

## 2016-08-31 ENCOUNTER — Emergency Department (HOSPITAL_COMMUNITY)
Admission: EM | Admit: 2016-08-31 | Discharge: 2016-08-31 | Disposition: A | Discharge status: Home / Self Care | Payer: Self-pay | Attending: Emergency Medicine | Admitting: Emergency Medicine

## 2016-08-31 ENCOUNTER — Emergency Department (HOSPITAL_COMMUNITY): Payer: Self-pay

## 2016-08-31 DIAGNOSIS — G8929 Other chronic pain: Secondary | ICD-10-CM | POA: Insufficient documentation

## 2016-08-31 DIAGNOSIS — I10 Essential (primary) hypertension: Secondary | ICD-10-CM | POA: Insufficient documentation

## 2016-08-31 DIAGNOSIS — M25572 Pain in left ankle and joints of left foot: Secondary | ICD-10-CM | POA: Insufficient documentation

## 2016-08-31 DIAGNOSIS — Z79899 Other long term (current) drug therapy: Secondary | ICD-10-CM | POA: Insufficient documentation

## 2016-08-31 DIAGNOSIS — F10920 Alcohol use, unspecified with intoxication, uncomplicated: Secondary | ICD-10-CM | POA: Insufficient documentation

## 2016-08-31 DIAGNOSIS — F17210 Nicotine dependence, cigarettes, uncomplicated: Secondary | ICD-10-CM | POA: Insufficient documentation

## 2016-08-31 LAB — ETHANOL: ALCOHOL ETHYL (B): 365 mg/dL — AB (ref <5)

## 2016-08-31 LAB — CBC WITH DIFFERENTIAL/PLATELET
Basophils Absolute: 0.1 10*3/uL (ref 0.0–0.1)
Basophils Relative: 1 %
EOS ABS: 0.2 10*3/uL (ref 0.0–0.7)
EOS PCT: 2 %
HCT: 41.5 % (ref 39.0–52.0)
Hemoglobin: 14.1 g/dL (ref 13.0–17.0)
LYMPHS ABS: 3.8 10*3/uL (ref 0.7–4.0)
LYMPHS PCT: 48 %
MCH: 26.5 pg (ref 26.0–34.0)
MCHC: 34 g/dL (ref 30.0–36.0)
MCV: 77.9 fL — AB (ref 78.0–100.0)
MONO ABS: 0.6 10*3/uL (ref 0.1–1.0)
MONOS PCT: 7 %
Neutro Abs: 3.3 10*3/uL (ref 1.7–7.7)
Neutrophils Relative %: 42 %
PLATELETS: 142 10*3/uL — AB (ref 150–400)
RBC: 5.33 MIL/uL (ref 4.22–5.81)
RDW: 16.4 % — AB (ref 11.5–15.5)
WBC: 7.8 10*3/uL (ref 4.0–10.5)

## 2016-08-31 LAB — BASIC METABOLIC PANEL
Anion gap: 14 (ref 5–15)
BUN: 9 mg/dL (ref 6–20)
CO2: 26 mmol/L (ref 22–32)
CREATININE: 0.85 mg/dL (ref 0.61–1.24)
Calcium: 8.4 mg/dL — ABNORMAL LOW (ref 8.9–10.3)
Chloride: 100 mmol/L — ABNORMAL LOW (ref 101–111)
GFR calc Af Amer: >60 mL/min (ref >60)
GLUCOSE: 108 mg/dL — AB (ref 65–99)
POTASSIUM: 3.5 mmol/L (ref 3.5–5.1)
SODIUM: 140 mmol/L (ref 135–145)

## 2016-08-31 LAB — SEDIMENTATION RATE: SED RATE: 3 mm/h (ref 0–16)

## 2016-08-31 LAB — C-REACTIVE PROTEIN

## 2016-08-31 MED ORDER — CHLORHEXIDINE GLUCONATE CLOTH 2 % EX PADS: 6.0000 | MEDICATED_PAD | Freq: Every day | CUTANEOUS | Status: DC

## 2016-08-31 MED ORDER — MUPIROCIN 2 % EX OINT: 1.0000 "application " | TOPICAL_OINTMENT | Freq: Two times a day (BID) | CUTANEOUS | Status: DC

## 2016-08-31 NOTE — ED Notes (Signed)
Pt returned from xray,  
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
## 2016-08-31 NOTE — ED Notes (Signed)
Date and time results received: 08/31/16 0633   Test: alcohol Critical Value: 365  Name of Provider Notified: EDP, Dr. Manus Gunningancour  Orders Received? Or Actions Taken?: N/A

## 2016-08-31 NOTE — ED Provider Notes (Signed)
[facility]-EMERGENCY DEPT Provider Note   CSN: [id number] Arrival date & time: 08/31/16  0400     History   Chief Complaint Chief Complaint  Patient presents with  . Leg Pain    left foot/lower leg    HPI Damon Alexander is a 31 y.o. male.  Patient with history of multiple surgeries to his left leg and ankle. Presenting with worsening left ankle pain since 8 AM yesterday morning. Denies any new trauma. History limited due to intoxication. States he is not taking any pain medication at home. Denies any fever or vomiting. Has pain diffusely in his left ankle. There is no been no bleeding or drainage. Most recent surgery was one month ago when he had an abscess drained to his left anterior leg. States he normally does not have pain in his ankle like this.   The history is provided by the patient.  Leg Pain      Past Medical History:  Diagnosis Date  . 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 Displaced pilon fracture of left tibia, subsequent encounter for open fracture type IIIA, IIIB, or IIIC with malunion 03/06/2016  . HTN (hypertension) 12/05/2015   pt denies this  . Medical history non-contributory   . Nicotine dependence 10/12/2015  . 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  . Type III open fracture of left tibial plafond with involvement of fibula 10/12/2015    Patient Active Problem List   Diagnosis Date Noted  . Abscess of left leg 07/28/2016  . Cellulitis 07/26/2016  . Hypokalemia 07/26/2016  . 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 Displaced pilon fracture of left tibia, subsequent encounter for open fracture type IIIA, IIIB, or IIIC with malunion 03/06/2016  . 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 HTN (hypertension) 12/05/2015  . 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 Open fracture of tibial plafond 12/04/2015  . Type III open fracture of left tibial plafond with involvement of fibula 10/12/2015  .  Alcohol abuse 10/12/2015  . Nicotine dependence 10/12/2015    Past Surgical History:  Procedure Laterality Date  . ANKLE FUSION Left 02/12/2016   Procedure: ARTHRODESIS ANKLE;  Surgeon: Myrene GalasMichael Handy, MD;  [facility];  Service: Orthopedics;  Laterality: Left;  . APPLICATION OF WOUND VAC Left 10/05/2015   Procedure: APPLICATION OF WOUND VAC;  Surgeon: Cammy CopaScott Gregory Dean, MD;  [facility];  Service: Orthopedics;  Laterality: Left;  . APPLICATION OF WOUND VAC Left 10/09/2015   Procedure: APPLICATION OF WOUND VAC;  Surgeon: Myrene GalasMichael Handy, MD;  [facility];  Service: Orthopedics;  Laterality: Left;  . EXTERNAL FIXATION LEG Left 10/05/2015   Procedure: EXTERNAL FIXATION LEG LEFT ANKLE & LEFT LOWER LEG;  Surgeon: Cammy CopaScott Gregory Dean, MD;  Location: [facility] OR;  Service: Orthopedics;  Laterality: Left;  . EXTERNAL FIXATION REMOVAL Left 12/04/2015   Procedure: REMOVAL EXTERNAL FIXATION LEFT LEG;  Surgeon: Myrene GalasMichael Handy, MD;  [facility];  Service: Orthopedics;  Laterality: Left;  . I&D EXTREMITY Left 10/05/2015   Procedure: IRRIGATION AND DEBRIDEMENT EXTREMITY LEFT ANKLE & LEFT  LOWER LEG,PLACEMENT OF ANTIBIOTIC BEADS;  Surgeon: Cammy CopaScott Gregory Dean, MD;  Location: [facility] OR;  Service: Orthopedics;  Laterality: Left;  . I&D EXTREMITY Left 10/09/2015   Procedure: IRRIGATION AND DEBRIDEMENT LEFT ANKLE POSSIBLE EX-FIX ADJUSTMENT;  Surgeon: Myrene GalasMichael Handy, MD;  [facility];  Service: Orthopedics;  Laterality: Left;  . I&D EXTREMITY Left  07/27/2016   Procedure: IRRIGATION AND DEBRIDEMENT EXTREMITY;  Surgeon: Sheral ApleyMurphy, Timothy D, MD;  [facility];  Service: Orthopedics;  Laterality: Left;  . LEG SURGERY     "rod in my right leg"  . MANDIBLE FRACTURE SURGERY    . NO PAST SURGERIES    . SKIN SPLIT GRAFT Left 12/04/2015   Procedure: SKIN GRAFT SPLIT THICKNESS LEFT LEG;  Surgeon: Myrene GalasMichael Handy, MD;  [facility];  Service: Orthopedics;  Laterality: Left;  . TIBIA IM NAIL INSERTION Right 07/31/2012    Procedure: INTRAMEDULLARY (IM) NAIL TIBIAL;  Surgeon: Nadara MustardMarcus V Duda, MD;  Location: [facility] OR;  Service: Orthopedics;  Laterality: Right;  Intramedullary Nail right tib/fib       Home Medications    Prior to Admission medications   Medication Sig Start Date End Date Taking? Authorizing Provider  docusate sodium (COLACE) 100 MG capsule Take 1 capsule (100 mg total) by mouth 2 (two) times daily. 02/13/16  Yes Montez MoritaPaul, Keith, PA-C  aspirin EC 325 MG tablet Take 1 tablet (325 mg total) by mouth every 12 (twelve) hours. Patient not taking: Reported on 07/26/2016 02/13/16   Montez MoritaPaul, Keith, PA-C  cefadroxil (DURICEF) 500 MG capsule Take 1 capsule (500 mg total) by mouth every 12 (twelve) hours. 07/28/16 09/11/16  Montez MoritaPaul, Keith, PA-C  HYDROcodone-acetaminophen (NORCO) 5-325 MG tablet Take 1-2 tablets by mouth every 8 (eight) hours as needed for moderate pain. 07/28/16   Montez MoritaPaul, Keith, PA-C  tetracycline (ACHROMYCIN,SUMYCIN) 500 MG capsule Take 1 capsule (500 mg total) by mouth 2 (two) times daily before a meal. 07/28/16 09/11/16  Montez MoritaPaul, Keith, PA-C    Family History Family History  Problem Relation Age of Onset  . Hypertension Mother     Social History Social History  Substance Use Topics  . Smoking status: Current Every Day Smoker    Packs/day: 1.00    Types: Cigarettes  . Smokeless tobacco: Never Used  . Alcohol use Yes     Allergies   Ciprofloxacin   Review of Systems Review of Systems  Constitutional: Negative for activity change, appetite change and fever.  HENT: Negative for congestion.   Respiratory: Negative for cough, chest tightness and shortness of breath.   Cardiovascular: Negative for chest pain.  Gastrointestinal: Negative for abdominal pain, nausea and vomiting.  Genitourinary: Negative for dysuria and hematuria.  Musculoskeletal: Positive for arthralgias and myalgias. Negative for back pain and neck pain.  Skin: Negative for rash.  Neurological: Negative for dizziness, syncope,  light-headedness and headaches.    all other systems are negative except as noted in the HPI and PMH.    Physical Exam Updated Vital Signs BP 119/76   Pulse 86   Temp 97.9 F (36.6 C) (Oral)   Resp 18   Ht 5' 9.5" (1.765 m)   Wt 104.3 kg (230 lb)   SpO2 92%   BMI 33.48 kg/m   Physical Exam  Constitutional: He is oriented to person, place, and time. He appears well-developed and well-nourished. No distress.  Sleeping on initial assessment intoxicated  HENT:  Head: Normocephalic and atraumatic.  Mouth/Throat: Oropharynx is clear and moist. No oropharyngeal exudate.  Eyes: Pupils are equal, round, and reactive to light. Conjunctivae and EOM are normal.  Neck: Normal range of motion. Neck supple.  No meningismus.  Cardiovascular: Normal rate, regular rhythm, normal heart sounds and intact distal pulses.   No murmur heard. Pulmonary/Chest: Effort normal and breath sounds normal. No respiratory distress.  Abdominal: Soft. There is no tenderness.  There is no rebound and no guarding.  Musculoskeletal: Normal range of motion. He exhibits edema and tenderness.  Edema to L ankle diffusely. No significant warmth or erythema. Intact DP and PT pulses.  ROM intact. Compartments soft.  Multiple well healed scars and abrasions. No erythema or fluctuance  Neurological: He is alert and oriented to person, place, and time. No cranial nerve deficit. He exhibits normal muscle tone. Coordination normal.  No ataxia on finger to nose bilaterally. No pronator drift. 5/5 strength throughout. CN 2-12 intact.Equal grip strength. Sensation intact.   Skin: Skin is warm.  Psychiatric: He has a normal mood and affect. His behavior is normal.  Nursing note and vitals reviewed.    ED Treatments / Results  Labs (all labs ordered are listed, but only abnormal results are displayed) Labs Reviewed  CBC WITH DIFFERENTIAL/PLATELET - Abnormal; Notable for the following:       Result Value   MCV 77.9 (*)     RDW 16.4 (*)    Platelets 142 (*)    All other components within normal limits  BASIC METABOLIC PANEL - Abnormal; Notable for the following:    Chloride 100 (*)    Glucose, Bld 108 (*)    Calcium 8.4 (*)    All other components within normal limits  ETHANOL - Abnormal; Notable for the following:    Alcohol, Ethyl (B) 365 (*)    All other components within normal limits  SEDIMENTATION RATE  C-REACTIVE PROTEIN    EKG  EKG Interpretation None       Radiology Dg Ankle Complete Left  Result Date: 08/31/2016 CLINICAL DATA:  Chronic leg pain and is worse since last night. EXAM: LEFT ANKLE COMPLETE - 3+ VIEW COMPARISON:  02/12/2016 FINDINGS: Instrumented tibiotalar fusion with plate screw fixation hardware. Distal fibular resection. One of the talar screws has backed out compare to the prior position. There is nonunion at the arthrodesis, with sclerosis. No acute fracture is evident. 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 IMPRESSION: 1. One of the talar screws has backed out. 2. 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 3. No evidence of acute fracture. Electronically Signed   By: Ellery Plunk M.D.   On: 08/31/2016 06:00    Procedures Procedures (including critical care time)  Medications Ordered in ED Medications - No data to display   Initial Impression / Assessment and Plan / ED Course  I have reviewed the triage vital signs and the nursing notes.  Pertinent labs & imaging results that were available during my care of the patient were reviewed by me and considered in my medical decision making (see chart for details).    Acute on chronic ankle pain after significant trauma. No new trauma. No fever. Neurovascularly intact.  No evidence of cellulitis or abscess.  X-ray shows no acute fracture or hardware failure. Inflammatory markers are negative.  Patient remains significantly intoxicated and somnolent. We'll need to allow to sober. No pain  medication will be provided  Patient to follow-up with his orthopedic surgeon Dr. Carola Frost. We'll await sobriety before discharge.  Final Clinical Impressions(s) / ED Diagnoses   Final diagnoses:  Chronic pain of left ankle  Alcoholic intoxication without complication [facility])    New Prescriptions New Prescriptions   No medications on file     Glynn Octave, MD 08/31/16 [phone]

## 2016-08-31 NOTE — Discharge Instructions (Addendum)
Follow up with Dr. Carola FrostHandy this week. Return to the ED if you develop new or worsening symptoms.

## 2016-08-31 NOTE — ED Notes (Signed)
Patient transported to X-ray 

## 2016-09-17 ENCOUNTER — Ambulatory Visit (HOSPITAL_COMMUNITY)
Admission: RE | Admit: 2016-09-17 | Discharge: 2016-09-17 | Disposition: A | Discharge status: Home / Self Care | Payer: Self-pay | Source: Ambulatory Visit | Attending: Orthopedic Surgery | Admitting: Orthopedic Surgery

## 2016-09-17 DIAGNOSIS — Z9689 Presence of other specified functional implants: Secondary | ICD-10-CM | POA: Insufficient documentation

## 2016-09-17 DIAGNOSIS — S82872N Displaced pilon fracture of left tibia, subsequent encounter for open fracture type IIIA, IIIB, or IIIC with nonunion: Secondary | ICD-10-CM | POA: Insufficient documentation

## 2017-07-30 ENCOUNTER — Other Ambulatory Visit: Payer: Self-pay

## 2017-07-30 ENCOUNTER — Emergency Department (HOSPITAL_COMMUNITY): Payer: Medicaid Other

## 2017-07-30 ENCOUNTER — Inpatient Hospital Stay (HOSPITAL_COMMUNITY): Payer: Medicaid Other

## 2017-07-30 ENCOUNTER — Encounter (HOSPITAL_COMMUNITY): Payer: Self-pay | Admitting: Emergency Medicine

## 2017-07-30 ENCOUNTER — Inpatient Hospital Stay (HOSPITAL_COMMUNITY)
Admission: EM | Admit: 2017-07-30 | Discharge: 2017-08-04 | DRG: 475 | Disposition: A | Discharge status: Home / Self Care | Payer: Medicaid Other | Attending: Internal Medicine | Admitting: Internal Medicine

## 2017-07-30 DIAGNOSIS — L02416 Cutaneous abscess of left lower limb: Secondary | ICD-10-CM | POA: Diagnosis present

## 2017-07-30 DIAGNOSIS — I10 Essential (primary) hypertension: Secondary | ICD-10-CM | POA: Diagnosis present

## 2017-07-30 DIAGNOSIS — D6489 Other specified anemias: Secondary | ICD-10-CM | POA: Diagnosis present

## 2017-07-30 DIAGNOSIS — T8142XA Infection following a procedure, deep incisional surgical site, initial encounter: Secondary | ICD-10-CM | POA: Diagnosis present

## 2017-07-30 DIAGNOSIS — M86462 Chronic osteomyelitis with draining sinus, left tibia and fibula: Secondary | ICD-10-CM | POA: Diagnosis present

## 2017-07-30 DIAGNOSIS — L03116 Cellulitis of left lower limb: Secondary | ICD-10-CM | POA: Diagnosis present

## 2017-07-30 DIAGNOSIS — F1010 Alcohol abuse, uncomplicated: Secondary | ICD-10-CM | POA: Diagnosis present

## 2017-07-30 DIAGNOSIS — E876 Hypokalemia: Secondary | ICD-10-CM | POA: Diagnosis present

## 2017-07-30 DIAGNOSIS — F17200 Nicotine dependence, unspecified, uncomplicated: Secondary | ICD-10-CM | POA: Diagnosis present

## 2017-07-30 DIAGNOSIS — Z981 Arthrodesis status: Secondary | ICD-10-CM | POA: Diagnosis not present

## 2017-07-30 DIAGNOSIS — S8253XK Displaced fracture of medial malleolus of unspecified tibia, subsequent encounter for closed fracture with nonunion: Secondary | ICD-10-CM | POA: Diagnosis not present

## 2017-07-30 DIAGNOSIS — M7989 Other specified soft tissue disorders: Secondary | ICD-10-CM

## 2017-07-30 DIAGNOSIS — F17210 Nicotine dependence, cigarettes, uncomplicated: Secondary | ICD-10-CM | POA: Diagnosis present

## 2017-07-30 DIAGNOSIS — Z7982 Long term (current) use of aspirin: Secondary | ICD-10-CM | POA: Diagnosis not present

## 2017-07-30 DIAGNOSIS — B9562 Methicillin resistant Staphylococcus aureus infection as the cause of diseases classified elsewhere: Secondary | ICD-10-CM | POA: Diagnosis present

## 2017-07-30 DIAGNOSIS — Z79899 Other long term (current) drug therapy: Secondary | ICD-10-CM | POA: Diagnosis not present

## 2017-07-30 DIAGNOSIS — R7881 Bacteremia: Secondary | ICD-10-CM | POA: Diagnosis present

## 2017-07-30 DIAGNOSIS — Z89512 Acquired absence of left leg below knee: Secondary | ICD-10-CM

## 2017-07-30 LAB — CBC WITH DIFFERENTIAL/PLATELET
Abs Immature Granulocytes: 0 10*3/uL (ref 0.0–0.1)
BASOS ABS: 0.1 10*3/uL (ref 0.0–0.1)
Basophils Relative: 1 %
Eosinophils Absolute: 0.2 10*3/uL (ref 0.0–0.7)
Eosinophils Relative: 2 %
HEMATOCRIT: 41.7 % (ref 39.0–52.0)
HEMOGLOBIN: 13.3 g/dL (ref 13.0–17.0)
Immature Granulocytes: 0 %
LYMPHS ABS: 2.9 10*3/uL (ref 0.7–4.0)
LYMPHS PCT: 22 %
MCH: 26.6 pg (ref 26.0–34.0)
MCHC: 31.9 g/dL (ref 30.0–36.0)
MCV: 83.4 fL (ref 78.0–100.0)
Monocytes Absolute: 1.5 10*3/uL — ABNORMAL HIGH (ref 0.1–1.0)
Monocytes Relative: 12 %
NEUTROS ABS: 8.1 10*3/uL — AB (ref 1.7–7.7)
Neutrophils Relative %: 63 %
Platelets: 300 10*3/uL (ref 150–400)
RBC: 5 MIL/uL (ref 4.22–5.81)
RDW: 13.6 % (ref 11.5–15.5)
WBC: 12.8 10*3/uL — AB (ref 4.0–10.5)

## 2017-07-30 LAB — BASIC METABOLIC PANEL
ANION GAP: 6 (ref 5–15)
Anion gap: 10 (ref 5–15)
BUN: 8 mg/dL (ref 6–20)
BUN: 9 mg/dL (ref 6–20)
CHLORIDE: 102 mmol/L (ref 98–111)
CHLORIDE: 103 mmol/L (ref 98–111)
CO2: 27 mmol/L (ref 22–32)
CO2: 27 mmol/L (ref 22–32)
CREATININE: 0.96 mg/dL (ref 0.61–1.24)
CREATININE: 1.04 mg/dL (ref 0.61–1.24)
Calcium: 9.1 mg/dL (ref 8.9–10.3)
Calcium: 8.3 mg/dL — ABNORMAL LOW (ref 8.9–10.3)
GFR calc Af Amer: >60 mL/min (ref >60)
GFR calc non Af Amer: >60 mL/min (ref >60)
GFR calc non Af Amer: >60 mL/min (ref >60)
GLUCOSE: 130 mg/dL — AB (ref 70–99)
Glucose, Bld: 98 mg/dL (ref 70–99)
Potassium: 3.3 mmol/L — ABNORMAL LOW (ref 3.5–5.1)
Potassium: 3.2 mmol/L — ABNORMAL LOW (ref 3.5–5.1)
SODIUM: 136 mmol/L (ref 135–145)
Sodium: 139 mmol/L (ref 135–145)

## 2017-07-30 LAB — SURGICAL PCR SCREEN
MRSA, PCR: NEGATIVE
Staphylococcus aureus: NEGATIVE

## 2017-07-30 LAB — CBC
HEMATOCRIT: 37.9 % — AB (ref 39.0–52.0)
Hemoglobin: 12.2 g/dL — ABNORMAL LOW (ref 13.0–17.0)
MCH: 26.6 pg (ref 26.0–34.0)
MCHC: 32.2 g/dL (ref 30.0–36.0)
MCV: 82.8 fL (ref 78.0–100.0)
PLATELETS: 266 10*3/uL (ref 150–400)
RBC: 4.58 MIL/uL (ref 4.22–5.81)
RDW: 13.7 % (ref 11.5–15.5)
WBC: 12.9 10*3/uL — ABNORMAL HIGH (ref 4.0–10.5)

## 2017-07-30 LAB — MAGNESIUM: Magnesium: 1.8 mg/dL (ref 1.7–2.4)

## 2017-07-30 LAB — I-STAT CG4 LACTIC ACID, ED: Lactic Acid, Venous: 1.18 mmol/L (ref 0.5–1.9)

## 2017-07-30 LAB — HIV ANTIBODY (ROUTINE TESTING W REFLEX): HIV Screen 4th Generation wRfx: NONREACTIVE

## 2017-07-30 LAB — C-REACTIVE PROTEIN: CRP: 7.8 mg/dL — AB (ref <1.0)

## 2017-07-30 LAB — SEDIMENTATION RATE: Sed Rate: 15 mm/hr (ref 0–16)

## 2017-07-30 MED ORDER — POTASSIUM CHLORIDE CRYS ER 20 MEQ PO TBCR
40.0000 meq | EXTENDED_RELEASE_TABLET | Freq: Once | ORAL | Status: AC
  Administered 2017-07-30: 40 meq via ORAL
  Filled 2017-07-30: qty 2

## 2017-07-30 MED ORDER — VANCOMYCIN HCL IN DEXTROSE 1-5 GM/200ML-% IV SOLN
1000.0000 mg | Freq: Three times a day (TID) | INTRAVENOUS | Status: DC
Start: 2017-07-30 — End: 2017-08-02
  Administered 2017-07-30 – 2017-08-02 (×9): 1000 mg via INTRAVENOUS
  Filled 2017-07-30 (×10): qty 200

## 2017-07-30 MED ORDER — NICOTINE 21 MG/24HR TD PT24
21.0000 mg | MEDICATED_PATCH | Freq: Every day | TRANSDERMAL | Status: DC
  Administered 2017-07-30 – 2017-08-01 (×3): 21 mg via TRANSDERMAL
  Filled 2017-07-30 (×5): qty 1

## 2017-07-30 MED ORDER — DOCUSATE SODIUM 100 MG PO CAPS
100.0000 mg | ORAL_CAPSULE | Freq: Two times a day (BID) | ORAL | Status: DC
  Administered 2017-07-30 – 2017-08-04 (×10): 100 mg via ORAL
  Filled 2017-07-30 (×11): qty 1

## 2017-07-30 MED ORDER — POLYETHYLENE GLYCOL 3350 17 G PO PACK: 17.0000 g | PACK | Freq: Every day | ORAL | Status: DC | PRN

## 2017-07-30 MED ORDER — FOLIC ACID 1 MG PO TABS
1.0000 mg | ORAL_TABLET | Freq: Every day | ORAL | Status: DC
  Administered 2017-07-30 – 2017-08-04 (×6): 1 mg via ORAL
  Filled 2017-07-30 (×6): qty 1

## 2017-07-30 MED ORDER — HYDROMORPHONE HCL 1 MG/ML IJ SOLN
0.5000 mg | Freq: Once | INTRAMUSCULAR | Status: AC
  Administered 2017-07-30: 0.5 mg via INTRAVENOUS
  Filled 2017-07-30: qty 1

## 2017-07-30 MED ORDER — VITAMIN B-1 100 MG PO TABS
100.0000 mg | ORAL_TABLET | Freq: Every day | ORAL | Status: DC
  Administered 2017-07-30 – 2017-08-04 (×6): 100 mg via ORAL
  Filled 2017-07-30 (×6): qty 1

## 2017-07-30 MED ORDER — ZOLPIDEM TARTRATE 5 MG PO TABS: 5.0000 mg | ORAL_TABLET | Freq: Every evening | ORAL | Status: DC | PRN

## 2017-07-30 MED ORDER — ONDANSETRON HCL 4 MG/2ML IJ SOLN: 4.0000 mg | Freq: Three times a day (TID) | INTRAMUSCULAR | Status: DC | PRN

## 2017-07-30 MED ORDER — THIAMINE HCL 100 MG/ML IJ SOLN: 100.0000 mg | Freq: Every day | INTRAMUSCULAR | Status: DC

## 2017-07-30 MED ORDER — LORAZEPAM 2 MG/ML IJ SOLN: 0.0000 mg | Freq: Two times a day (BID) | INTRAMUSCULAR | Status: AC

## 2017-07-30 MED ORDER — ENOXAPARIN SODIUM 40 MG/0.4ML ~~LOC~~ SOLN
40.0000 mg | SUBCUTANEOUS | Status: DC
  Administered 2017-07-30 – 2017-08-03 (×4): 40 mg via SUBCUTANEOUS
  Filled 2017-07-30 (×5): qty 0.4

## 2017-07-30 MED ORDER — LORAZEPAM 2 MG/ML IJ SOLN: 1.0000 mg | Freq: Four times a day (QID) | INTRAMUSCULAR | Status: DC | PRN

## 2017-07-30 MED ORDER — VANCOMYCIN HCL 10 G IV SOLR
2000.0000 mg | Freq: Once | INTRAVENOUS | Status: AC
  Administered 2017-07-30: 2000 mg via INTRAVENOUS
  Filled 2017-07-30: qty 2000

## 2017-07-30 MED ORDER — HYDRALAZINE HCL 20 MG/ML IJ SOLN: 5.0000 mg | INTRAMUSCULAR | Status: DC | PRN

## 2017-07-30 MED ORDER — ACETAMINOPHEN 325 MG PO TABS
650.0000 mg | ORAL_TABLET | Freq: Four times a day (QID) | ORAL | Status: DC | PRN
  Administered 2017-07-30 – 2017-07-31 (×3): 650 mg via ORAL
  Filled 2017-07-30 (×3): qty 2

## 2017-07-30 MED ORDER — LORAZEPAM 1 MG PO TABS: 1.0000 mg | ORAL_TABLET | Freq: Four times a day (QID) | ORAL | Status: DC | PRN

## 2017-07-30 MED ORDER — LORAZEPAM 2 MG/ML IJ SOLN
0.0000 mg | Freq: Four times a day (QID) | INTRAMUSCULAR | Status: AC
  Administered 2017-07-30 (×2): 2 mg via INTRAVENOUS
  Filled 2017-07-30 (×2): qty 1

## 2017-07-30 MED ORDER — ADULT MULTIVITAMIN W/MINERALS CH
1.0000 | ORAL_TABLET | Freq: Every day | ORAL | Status: DC
  Administered 2017-07-30 – 2017-08-04 (×6): 1 via ORAL
  Filled 2017-07-30 (×6): qty 1

## 2017-07-30 MED ORDER — HYDROMORPHONE HCL 1 MG/ML IJ SOLN
1.0000 mg | INTRAMUSCULAR | Status: DC | PRN
  Administered 2017-07-30 – 2017-07-31 (×6): 1 mg via INTRAVENOUS
  Filled 2017-07-30 (×6): qty 1

## 2017-07-30 MED ORDER — LIDOCAINE-EPINEPHRINE 1 %-1:100000 IJ SOLN
10.0000 mL | Freq: Once | INTRAMUSCULAR | Status: DC
  Filled 2017-07-30: qty 10

## 2017-07-30 MED ORDER — POTASSIUM CHLORIDE 20 MEQ/15ML (10%) PO SOLN
20.0000 meq | Freq: Once | ORAL | Status: AC
  Administered 2017-07-30: 20 meq via ORAL
  Filled 2017-07-30: qty 15

## 2017-07-30 MED ORDER — HYDROMORPHONE HCL 1 MG/ML IJ SOLN
1.0000 mg | Freq: Once | INTRAMUSCULAR | Status: AC
  Administered 2017-07-30: 1 mg via INTRAVENOUS
  Filled 2017-07-30: qty 1

## 2017-07-30 MED ORDER — ONDANSETRON HCL 4 MG/2ML IJ SOLN
4.0000 mg | Freq: Once | INTRAMUSCULAR | Status: AC
  Administered 2017-07-30: 4 mg via INTRAVENOUS
  Filled 2017-07-30: qty 2

## 2017-07-30 MED ORDER — HYDROCODONE-ACETAMINOPHEN 5-325 MG PO TABS
2.0000 | ORAL_TABLET | Freq: Three times a day (TID) | ORAL | Status: DC | PRN
  Administered 2017-07-30 – 2017-07-31 (×4): 2 via ORAL
  Filled 2017-07-30 (×4): qty 2

## 2017-07-30 NOTE — Progress Notes (Signed)
Pharmacy Antibiotic Note  Damon BeckSamuel D Alexander is a 32 y.o. male admitted on 07/30/2017 with cellulitis.  Pharmacy has been consulted for vancomycin dosing.  Plan: Vancomycin 2gm IV x 1 then 1gm IV q8 hours F/u renal function, cultures and clinical course  Height: 5\' 9"  (175.3 cm) Weight: 230 lb (104.3 kg) IBW/kg (Calculated) : 70.7  Temp (24hrs), Avg:98.6 F (37 C), Min:98.6 F (37 C), Max:98.6 F (37 C)  Recent Labs  Lab 07/30/17 0045 07/30/17 0053  WBC 12.8*  --   CREATININE 1.04  --   LATICACIDVEN  --  1.18    Estimated Creatinine Clearance: 122.4 mL/min (by C-G formula based on SCr of 1.04 mg/dL).    Allergies  Allergen Reactions  . Ciprofloxacin Other (See Comments)    Seizure     Thank you for allowing pharmacy to be a part of this patient's care.  Talbert CageSeay, Marvelene Stoneberg Poteet 07/30/2017 1:58 AM

## 2017-07-30 NOTE — Progress Notes (Signed)
Called ER RN for report. Room ready.  

## 2017-07-30 NOTE — Progress Notes (Addendum)
PROGRESS NOTE   Damon Alexander  WUJ:[id number]    DOB: 31-Mar-1985    DOA: 07/30/2017  PCP: Patient, No Pcp Per   I [facility] briefly [facility].  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  Admitted for left leg cellulitis, possible abscess on top of nonunion fracture.  ID consulted.  MRI pending.   Assessment & Plan:   Principal Problem:   Left leg cellulitis Active Problems:   Alcohol abuse   Nicotine dependence   HTN (hypertension)   Hypokalemia   Left leg swelling   Left leg cellulitis, suspected superficial abscess/draining sinus from deep tissue infection, complicating left nonunion tibia-fibula fracture: ID was consulted.  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  Continue empirically started IV Zosyn.  Lower extremity venous Doppler negative for DVT.  X-rays do not show osteomyelitis.  Follow-up MRI of left leg and consider orthopedics consultation based on results.  (Dr. Carola FrostHandy).  Addendum: It appears that orthopedics have already consulted.  Please refer to their notes for details.  Tobacco abuse: Cessation counseled.  Continue nicotine patch.  Alcohol abuse Continue CIWA protocol.  No overt withdrawal at this time.  Possible hypertension:  Patient denies.  Continue PRN IV hydralazine.  Pain control.  Hypokalemia Replace and follow.  Magnesium 1.8.   DVT prophylaxis: Lovenox Code Status: Full Family Communication: None at bed Disposition: To be determined pending clinical improvement   Consultants:  Infectious disease  Procedures:  None  Antimicrobials:  IV vancomycin   Subjective: Ongoing left leg pain, reports tan-colored/bloody drainage intermittently from small left shin wound.  Swelling mostly on the lateral  aspect of the left ankle.  Denies any other complaints.  ROS: As above.  Objective:  Vitals:   07/30/17 0345 07/30/17 0415 07/30/17 0505 07/30/17 0805  BP: (!) 171/154 (!) 153/62 134/76 128/74  Pulse: (!) 103 (!) 103 (!) 101 (!) 103  Resp:      Temp:   99.2 F (37.3 C) 98.2 F (36.8 C)  TempSrc:   Oral Oral  SpO2: 100% 98% 100% 96%  Weight:      Height:        Examination:  General exam: Pleasant young male, well-built and nourished, sitting up comfortably in bed. Respiratory system: Clear to auscultation. Respiratory effort normal. Cardiovascular system: S1 & S2 heard, RRR. No JVD, murmurs, rubs, gallops or clicks. No pedal edema. Gastrointestinal system: Abdomen is nondistended, soft and nontender. No organomegaly or masses felt. Normal bowel sounds heard. Central nervous system: Alert and oriented. No focal neurological deficits. Extremities: Symmetric 5 x 5 power.  Left leg swelling, increased warmth, tenderness and faint redness.  Swelling most prominent in the lateral aspect of left ankle.  Tiny wound anterior mid shin which was not draining when I removed the dressing. Skin: No rashes, lesions or ulcers Psychiatry: Judgement and insight appear normal. Mood & affect appropriate.     Data Reviewed: I have personally reviewed following labs and imaging studies  CBC: Recent Labs  Lab 07/30/17 0045 07/30/17 0433  WBC 12.8* 12.9*  NEUTROABS 8.1*  --   HGB 13.3 12.2*  HCT 41.7 37.9*  MCV 83.4 82.8  PLT 300 266   Basic Metabolic Panel: Recent Labs  Lab 07/30/17 0045 07/30/17 0433  NA 139 136  K 3.3* 3.2*  CL  102 103  CO2 27 27  GLUCOSE 98 130*  BUN 8 9  CREATININE 1.04 0.96  CALCIUM 9.1 8.3*  MG  --  1.8    Recent Results (from the past 240 hour(s))  Wound or Superficial Culture     Status: None (Preliminary result)   Collection Time: 07/30/17 12:30 AM  Result Value Ref Range Status   Specimen Description LEG  Final   Special Requests NONE  Final    Gram Stain   Final    RARE WBC PRESENT, PREDOMINANTLY PMN RARE GRAM POSITIVE COCCI Performed at [facility], [address] [place], [place] [address]    Culture PENDING  Incomplete   Report Status PENDING  Incomplete         Radiology Studies: Dg Tibia/fibula Left  Result Date: 07/30/2017 CLINICAL DATA:  32 y/o  M; left leg pain, edema, and leaking poss. EXAM: LEFT FOOT - COMPLETE 3+ VIEW; LEFT TIBIA AND FIBULA - 2 VIEW; LEFT ANKLE COMPLETE - 3+ VIEW COMPARISON:  09/17/2016 left ankle CT. 08/31/2016 left ankle radiographs. FINDINGS: Left foot: There is no evidence of fracture or dislocation. There is no evidence of arthropathy or other focal bone abnormality. Lisfranc alignment is maintained. Ankle joint fusion as described below. Left tibia and fibula: Multiple lucencies within the tibial diaphysis from prior hardware. Ankle fusion described below. Knee joint is well maintained. No acute fracture or dislocation identified. Left ankle: Lower fibular resection. Lateral plate fixation across the tibiotalar joint. Interval partial bony bridging across the joint. Stable nonunion medial malleolus fracture. Stable backing out of 2 talar dome screws. No new periprosthetic lucency, fracture, or hardware failure identified. No lucent or erosive changes to suggest osteomyelitis. IMPRESSION: 1. No acute fracture or dislocation identified. 2. No findings of osteomyelitis. 3. Plate fixation across tibiotalar joint. Interval bony bridging across the joint space. No new apparent hardware related complication. 4. Stable nonunion fracture of medial malleolus. Electronically Signed   By: Mitzi Hansen M.D.   On: 07/30/2017 01:51   Dg Ankle Complete Left  Result Date: 07/30/2017 CLINICAL DATA:  32 y/o  M; left leg pain, edema, and leaking poss. EXAM: LEFT FOOT - COMPLETE 3+ VIEW; LEFT TIBIA AND FIBULA - 2 VIEW; LEFT ANKLE COMPLETE - 3+ VIEW COMPARISON:  09/17/2016 left ankle CT. 08/31/2016 left  ankle radiographs. FINDINGS: Left foot: There is no evidence of fracture or dislocation. There is no evidence of arthropathy or other focal bone abnormality. Lisfranc alignment is maintained. Ankle joint fusion as described below. Left tibia and fibula: Multiple lucencies within the tibial diaphysis from prior hardware. Ankle fusion described below. Knee joint is well maintained. No acute fracture or dislocation identified. Left ankle: Lower fibular resection. Lateral plate fixation across the tibiotalar joint. Interval partial bony bridging across the joint. Stable nonunion medial malleolus fracture. Stable backing out of 2 talar dome screws. No new periprosthetic lucency, fracture, or hardware failure identified. No lucent or erosive changes to suggest osteomyelitis. IMPRESSION: 1. No acute fracture or dislocation identified. 2. No findings of osteomyelitis. 3. Plate fixation across tibiotalar joint. Interval bony bridging across the joint space. No new apparent hardware related complication. 4. Stable nonunion fracture of medial malleolus. Electronically Signed   By: Mitzi Hansen M.D.   On: 07/30/2017 01:51   Dg Foot Complete Left  Result Date: 07/30/2017 CLINICAL DATA:  32 y/o  M; left leg pain, edema, and leaking poss. EXAM: LEFT FOOT - COMPLETE 3+ VIEW; LEFT TIBIA AND FIBULA -  2 VIEW; LEFT ANKLE COMPLETE - 3+ VIEW COMPARISON:  09/17/2016 left ankle CT. 08/31/2016 left ankle radiographs. FINDINGS: Left foot: There is no evidence of fracture or dislocation. There is no evidence of arthropathy or other focal bone abnormality. Lisfranc alignment is maintained. Ankle joint fusion as described below. Left tibia and fibula: Multiple lucencies within the tibial diaphysis from prior hardware. Ankle fusion described below. Knee joint is well maintained. No acute fracture or dislocation identified. Left ankle: Lower fibular resection. Lateral plate fixation across the tibiotalar joint. Interval partial  bony bridging across the joint. Stable nonunion medial malleolus fracture. Stable backing out of 2 talar dome screws. No new periprosthetic lucency, fracture, or hardware failure identified. No lucent or erosive changes to suggest osteomyelitis. IMPRESSION: 1. No acute fracture or dislocation identified. 2. No findings of osteomyelitis. 3. Plate fixation across tibiotalar joint. Interval bony bridging across the joint space. No new apparent hardware related complication. 4. Stable nonunion fracture of medial malleolus. Electronically Signed   By: Mitzi Hansen M.D.   On: 07/30/2017 01:51        Scheduled Meds: . docusate sodium  100 mg Oral BID  . enoxaparin (LOVENOX) injection  40 mg Subcutaneous Q24H  . folic acid  1 mg Oral Daily  . lidocaine-EPINEPHrine  10 mL Intradermal Once  . LORazepam  0-4 mg Intravenous Q6H   Followed by  . [START ON 08/01/2017] LORazepam  0-4 mg Intravenous Q12H  . multivitamin with minerals  1 tablet Oral Daily  . nicotine  21 mg Transdermal Daily  . thiamine  100 mg Oral Daily   Or  . thiamine  100 mg Intravenous Daily   Continuous Infusions: . vancomycin 1,000 mg (07/30/17 0933)     LOS: 0 days     Marcellus Scott, MD, FACP, [facility]. [facility] Pager [phone]  If 7PM-7AM, please contact night-coverage [email or web address] Password [facility] 07/30/2017, 4:27 PM

## 2017-07-30 NOTE — Progress Notes (Signed)
New Admission Note:  Arrival Method: Stretcher Mental Orientation: Alert and oriented x 4 Telemetry: N/A Assessment: Completed Skin: Warm and dry.  IV: NSL Pain: 6/10 Left leg Tubes: N/A Safety Measures: Safety Fall Prevention Plan initiated.  Admission: Completed 5 M  Orientation: Patient has been orientated to the room, unit and the staff. Family: At bedside.   Orders have been reviewed and implemented. Will continue to monitor the patient. Call light has been placed within reach and bed alarm has been activated.   [place]ilford ShiEmmanuel Nathanyl Andujo BSN, RN  Phone Number: [phone]

## 2017-07-30 NOTE — Progress Notes (Signed)
Patient ID: Damon BeckSamuel D Lage, male   DOB: 1985/09/17, 32 y.o.   MRN: [id number]   LOS: 0 days   Subjective: Consulted by ID to assess this pt. He has been in his usual state of health until the last couple of days where he describes increased left ankle pain and swelling. It got to the point where he could not bear weight and he came to the ED for evaluation. He also has a left shin wound that drains purulent material nearly daily. It's been stable for the last ~5 months. He has been unable to f/u with orthopedics 2/2 financial reasons. He denies chills, sweats, N/V. He has had some mild subjective fevers.   Objective: Vital signs in last 24 hours: Temp:  [98.2 F (36.8 C)-99.2 F (37.3 C)] 98.2 F (36.8 C) (06/27 0805) Pulse Rate:  [87-103] 103 (06/27 0805) Resp:  [18] 18 (06/27 0012) BP: (128-171)/(62-154) 128/74 (06/27 0805) SpO2:  [96 %-100 %] 96 % (06/27 0805) Weight:  [104.3 kg (230 lb)] 104.3 kg (230 lb) (06/27 0107) Last BM Date: 07/29/17   Laboratory  CBC Recent Labs    07/30/17 0045 07/30/17 0433  WBC 12.8* 12.9*  HGB 13.3 12.2*  HCT 41.7 37.9*  PLT 300 266   BMET Recent Labs    07/30/17 0045 07/30/17 0433  NA 139 136  K 3.3* 3.2*  CL 102 103  CO2 27 27  GLUCOSE 98 130*  BUN 8 9  CREATININE 1.04 0.96  CALCIUM 9.1 8.3*     Physical Exam General appearance: alert and no distress  LLE Chronic scarring of medial midshaft tibia with 2 sinus tracts that are draining purulent material. Minimally TTP, no obvious fluctuance.  No knee effusion, large ankle effusion, especially evident laterally, ankle severe TTP, esp laterally, pain with AROM/PROM, limited to ~15 degrees  Knee stable to varus/ valgus and anterior/posterior stress  Sens DPN, SPN, TN intact  Motor EHL, ext, flex, evers 5/5  DP 2+, PT 1+, Mild pedal edema    Assessment/Plan: Left shin wound -- Given the chronicity and appearance of this I worry about underlying osteo as an etiology. Will get  MRI to assess. Given reccurrence of symptoms so soon after formal I&D in OR 6 months ago (~2 weeks), doubt bedside I&D will be successful. Will wait on MRI results, dry dressings in meantime. Left ankle pain/swelling -- Hardware and bone healing appear stable on x-ray. Will get CT scan. Septic joint possible but inflammatory markers equivocal, no fever. No definite operative indication as yet. Will follow up on imaging.    Freeman CaldronMichael J. Tayah Idrovo, PA-C Orthopedic Surgery [phone] 07/30/2017

## 2017-07-30 NOTE — ED Provider Notes (Signed)
[facility] Provider Note   CSN: [id number] Arrival date & time: 07/30/17  0009     History   Chief Complaint Chief Complaint  Patient presents with  . Foot Pain  . Ankle Pain    HPI Damon Alexander is a 32 y.o. male.  Patient presents to the emergency department for evaluation of pain and swelling of left lower leg.  Patient has a previous history of fracture of the ankle with internal fixation.  He reports that he had a subsequent staph infection.  It seems that he has been lost to follow-up with orthopedics secondary to lack of health insurance.  He has had significant increase in pain and swelling of the left ankle and lower leg with drainage of pus from the left shin today.  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  He has not noticed any fever.     Past Medical History:  Diagnosis Date  . 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 Displaced pilon fracture of left tibia, subsequent encounter for open fracture type IIIA, IIIB, or IIIC with malunion 03/06/2016  . HTN (hypertension) 12/05/2015   pt denies this  . Medical history non-contributory   . Nicotine dependence 10/12/2015  . 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  . Type III open fracture of left tibial plafond with involvement of fibula 10/12/2015    Patient Active Problem List   Diagnosis Date Noted  . Abscess of left leg 07/28/2016  . Cellulitis 07/26/2016  . Hypokalemia 07/26/2016  . 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 Displaced pilon fracture of left tibia, subsequent encounter for open fracture type IIIA, IIIB, or IIIC with malunion 03/06/2016  . 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 HTN (hypertension) 12/05/2015  . 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 Open fracture of tibial plafond 12/04/2015  . Type III open fracture of left tibial plafond with  involvement of fibula 10/12/2015  . Alcohol abuse 10/12/2015  . Nicotine dependence 10/12/2015    Past Surgical History:  Procedure Laterality Date  . ANKLE FUSION Left 02/12/2016   Procedure: ARTHRODESIS ANKLE;  Surgeon: Myrene GalasMichael Handy, MD;  [facility];  Service: Orthopedics;  Laterality: Left;  . APPLICATION OF WOUND VAC Left 10/05/2015   Procedure: APPLICATION OF WOUND VAC;  Surgeon: Cammy CopaScott Gregory Dean, MD;  [facility];  Service: Orthopedics;  Laterality: Left;  . APPLICATION OF WOUND VAC Left 10/09/2015   Procedure: APPLICATION OF WOUND VAC;  Surgeon: Myrene GalasMichael Handy, MD;  [facility];  Service: Orthopedics;  Laterality: Left;  . EXTERNAL FIXATION LEG Left 10/05/2015   Procedure: EXTERNAL FIXATION LEG LEFT ANKLE & LEFT LOWER LEG;  Surgeon: Cammy CopaScott Gregory Dean, MD;  Location: [facility] OR;  Service: Orthopedics;  Laterality: Left;  . EXTERNAL FIXATION REMOVAL Left 12/04/2015   Procedure: REMOVAL EXTERNAL FIXATION LEFT LEG;  Surgeon: Myrene GalasMichael Handy, MD;  [facility];  Service: Orthopedics;  Laterality: Left;  . I&D EXTREMITY Left 10/05/2015   Procedure: IRRIGATION AND DEBRIDEMENT EXTREMITY LEFT ANKLE & LEFT  LOWER LEG,PLACEMENT OF ANTIBIOTIC BEADS;  Surgeon: Cammy CopaScott Gregory Dean, MD;  Location: [facility] OR;  Service: Orthopedics;  Laterality: Left;  . I&D EXTREMITY Left 10/09/2015   Procedure: IRRIGATION AND DEBRIDEMENT LEFT ANKLE POSSIBLE EX-FIX ADJUSTMENT;  Surgeon: Myrene GalasMichael Handy, MD;  [facility];  Service: Orthopedics;  Laterality: Left;  . I&D EXTREMITY Left 07/27/2016   Procedure: IRRIGATION AND DEBRIDEMENT EXTREMITY;  Surgeon: Sheral Apley, MD;  Location: [facility] OR;  Service: Orthopedics;  Laterality: Left;  . LEG SURGERY     "rod in my right leg"  . MANDIBLE FRACTURE SURGERY    . NO PAST SURGERIES    . SKIN SPLIT GRAFT Left 12/04/2015   Procedure: SKIN GRAFT SPLIT THICKNESS LEFT LEG;  Surgeon: Myrene Galas, MD;  Location: [facility] OR;  Service: Orthopedics;  Laterality: Left;  . TIBIA IM NAIL  INSERTION Right 07/31/2012   Procedure: INTRAMEDULLARY (IM) NAIL TIBIAL;  Surgeon: Nadara Mustard, MD;  Location: [facility] OR;  Service: Orthopedics;  Laterality: Right;  Intramedullary Nail right tib/fib        Home Medications    Prior to Admission medications   Medication Sig Start Date End Date Taking? Authorizing Provider  aspirin EC 325 MG tablet Take 1 tablet (325 mg total) by mouth every 12 (twelve) hours. Patient not taking: Reported on 07/26/2016 02/13/16   Montez Morita, PA-C  docusate sodium (COLACE) 100 MG capsule Take 1 capsule (100 mg total) by mouth 2 (two) times daily. 02/13/16   Montez Morita, PA-C  HYDROcodone-acetaminophen (NORCO) 5-325 MG tablet Take 1-2 tablets by mouth every 8 (eight) hours as needed for moderate pain. 07/28/16   Montez Morita, PA-C    Family History Family History  Problem Relation Age of Onset  . Hypertension Mother     Social History Social History   Tobacco Use  . Smoking status: Current Every Day Smoker    Packs/day: 1.00    Types: Cigarettes  . Smokeless tobacco: Never Used  Substance Use Topics  . Alcohol use: Yes  . Drug use: No     Allergies   Ciprofloxacin   Review of Systems Review of Systems  Constitutional: Negative for fever.  Skin: Positive for wound.  All other systems reviewed and are negative.    Physical Exam Updated Vital Signs BP (!) 151/92   Pulse 92   Temp 98.6 F (37 C) (Oral)   Resp 18   Ht 5\' 9"  (1.753 m)   Wt 104.3 kg (230 lb)   SpO2 100%   BMI 33.97 kg/m   Physical Exam  Constitutional: He is oriented to person, place, and time. He appears well-developed and well-nourished. No distress.  HENT:  Head: Normocephalic and atraumatic.  Right Ear: Hearing normal.  Left Ear: Hearing normal.  Nose: Nose normal.  Mouth/Throat: Oropharynx is clear and moist and mucous membranes are normal.  Eyes: Pupils are equal, round, and reactive to light. Conjunctivae and EOM are normal.  Neck: Normal range of motion.  Neck supple.  Cardiovascular: Regular rhythm, S1 normal and S2 normal. Exam reveals no gallop and no friction rub.  No murmur heard. Pulmonary/Chest: Effort normal and breath sounds normal. No respiratory distress. He exhibits no tenderness.  Abdominal: Soft. Normal appearance and bowel sounds are normal. There is no hepatosplenomegaly. There is no tenderness. There is no rebound, no guarding, no tenderness at McBurney's point and negative Murphy's sign. No hernia.  Musculoskeletal:       Left ankle: He exhibits decreased range of motion and swelling. Tenderness.       Left lower leg: He exhibits swelling.       Legs: Neurological: He is alert and oriented to person, place, and time. He has normal strength. No cranial nerve deficit or sensory deficit. Coordination normal. GCS eye subscore is 4.  GCS verbal subscore is 5. GCS motor subscore is 6.  Skin: Skin is warm and dry. No rash noted. No cyanosis.     Psychiatric: He has a normal mood and affect. His speech is normal and behavior is normal. Thought content normal.  Nursing note and vitals reviewed.    ED Treatments / Results  Labs (all labs ordered are listed, but only abnormal results are displayed) Labs Reviewed  CBC WITH DIFFERENTIAL/PLATELET - Abnormal; Notable for the following components:      Result Value   WBC 12.8 (*)    Neutro Abs 8.1 (*)    Monocytes Absolute 1.5 (*)    All other components within normal limits  BASIC METABOLIC PANEL - Abnormal; Notable for the following components:   Potassium 3.3 (*)    All other components within normal limits  AEROBIC CULTURE (SUPERFICIAL SPECIMEN)  CULTURE, BLOOD (ROUTINE X 2)  CULTURE, BLOOD (ROUTINE X 2)  I-STAT CG4 LACTIC ACID, ED    EKG None  Radiology No results found.  Procedures Procedures (including critical care time)  Medications Ordered in ED Medications - No data to display   Initial Impression / Assessment and Plan / ED Course  I have reviewed the  triage vital signs and the nursing notes.  Pertinent labs & imaging results that were available during my care of the patient were reviewed by me and considered in my medical decision making (see chart for details).     She presents to the emergency department for evaluation of pain, swelling, drainage from the left lower leg.  Patient has a history of chronic problems with the leg secondary to open fracture with nonunion in the past.  He has hardware in the area of the ankle.  The entire lower leg is very swollen and hot to the touch.  Anterior mid shin region has a wound with pus draining.  He does not appear septic.  No tachycardia, fever or elevated lactic acid.  No hypotension.  This is, however, consistent with soft tissue infection.  X-ray does not show obvious osteomyelitis.  Will initiate IV antibiotics, admit for further evaluation and treatment.  Final Clinical Impressions(s) / ED Diagnoses   Final diagnoses:  Cellulitis of left lower extremity    ED Discharge Orders    None       Gilda Crease, MD 07/30/17 [phone]

## 2017-07-30 NOTE — Consult Note (Signed)
[facility]    Date of Admission:  07/30/2017             Reason for Consult: Cellulitis / Abscess  Referring Provider:  [facility]  Primary Care Provider: Patient, No Pcp Per   Assessment/Plan:  Mr. Damon Alexander is a 32 y/o male with previous history of left non-union tib-fib fracture with MRSA abscess who presents with left lower extremity swelling, pain and edema. Appears he has a new abscess on top of the non-union fracture with preliminary gram stain with GPC. 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 He is currently receiving vancomycin and remains a febrile with slightly elevated WBC. There is no evidence of DVT on ultrasound.   1. Continue current dosage of vancomycin pending culture results.  2. Orthopedics consulted for potential need of surgical intervention.     Principal Problem:   Left leg cellulitis Active Problems:   Alcohol abuse   Nicotine dependence   HTN (hypertension)   Hypokalemia   Left leg swelling   . docusate sodium  100 mg Oral BID  . enoxaparin (LOVENOX) injection  40 mg Subcutaneous Q24H  . folic acid  1 mg Oral Daily  . LORazepam  0-4 mg Intravenous Q6H   Followed by  . [START ON 08/01/2017] LORazepam  0-4 mg Intravenous Q12H  . multivitamin with minerals  1 tablet Oral Daily  . nicotine  21 mg Transdermal Daily  . thiamine  100 mg Oral Daily   Or  . thiamine  100 mg Intravenous Daily     HPI: Damon Alexander is a 32 y.o. male with previous medical history of alcohol use, tobacco abuse, and questionable hypertension who presented to the ED with leg pain and swelling in a previously fractured leg about 1 year prior. Reports chronic left leg swelling. Pain aggravated by movement. Small wound on the anterior shin with purulent drainage. No fevers or chills.    WBC count of 12.9 and afebrile in the ED. X-ray with no evidence of osteomyelitis with stable non-union fracture of the malleolus.  Previous cultures from an abscess of the left leg were positive for MRSA in 07/27/16. New wound cultures taken on 6/27 with gram stain showing GPC. Blood cultures are pending. He is currently on vancomycin. Ultrasound completed with no evidence of DVT.    Review of Systems: Review of Systems  Constitutional: Negative for chills, diaphoresis and fever.  Respiratory: Negative for cough, shortness of breath and wheezing.   Cardiovascular: Negative for chest pain and leg swelling.  Gastrointestinal: Negative for abdominal pain, constipation, diarrhea, nausea and vomiting.  Musculoskeletal:       Positive for left lower extremity swelling and pain.   Skin: Positive for rash.     Past Medical History:  Diagnosis Date  . 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 Displaced pilon fracture of left tibia, subsequent encounter for open fracture type IIIA, IIIB, or IIIC with malunion 03/06/2016  . HTN (hypertension) 12/05/2015   pt denies this  . Medical history non-contributory   . Nicotine dependence 10/12/2015  . 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  . Type III open fracture of left tibial plafond with involvement of fibula 10/12/2015    Social History   Tobacco Use  . Smoking status: Current Every Day Smoker    Packs/day: 1.00    Types: Cigarettes  . Smokeless tobacco: Never Used  Substance Use Topics  .  Alcohol use: Yes  . Drug use: No    Family History  Problem Relation Age of Onset  . Hypertension Mother     Allergies  Allergen Reactions  . Ciprofloxacin Other (See Comments)    Caused a seizure    OBJECTIVE: Blood pressure 128/74, pulse (!) 103, temperature 98.2 F (36.8 C), temperature source Oral, resp. rate 18, height 5\' 9"  (1.753 m), weight 230 lb (104.3 kg), SpO2 96 %.  Physical Exam  Constitutional: He is oriented to person, place, and time. He appears well-developed and well-nourished. No distress.  Cardiovascular: Normal rate, regular rhythm, normal  heart sounds and intact distal pulses. Exam reveals no gallop and no friction rub.  No murmur heard. Pulmonary/Chest: Effort normal and breath sounds normal.  Musculoskeletal:  Left lower extremity swelling and red with no obvious deformity. There is significant warmth throughout the lower extremity. There is generalized tenderness. Pulses and sensation are intact and appropriate.   Neurological: He is alert and oriented to person, place, and time.  Skin: Skin is warm. Rash noted.  Psychiatric: He has a normal mood and affect. His behavior is normal. Judgment and thought content normal.        Lab Results Lab Results  Component Value Date   WBC 12.9 (H) 07/30/2017   HGB 12.2 (L) 07/30/2017   HCT 37.9 (L) 07/30/2017   MCV 82.8 07/30/2017   PLT 266 07/30/2017    Lab Results  Component Value Date   CREATININE 0.96 07/30/2017   BUN 9 07/30/2017   NA 136 07/30/2017   K 3.2 (L) 07/30/2017   CL 103 07/30/2017   CO2 27 07/30/2017    Lab Results  Component Value Date   ALT 11 (L) 07/26/2016   AST 18 07/26/2016   ALKPHOS 101 07/26/2016   BILITOT 0.5 07/26/2016     Microbiology: Recent Results (from the past 240 hour(s))  Wound or Superficial Culture     Status: None (Preliminary result)   Collection Time: 07/30/17 12:30 AM  Result Value Ref Range Status   Specimen Description LEG  Final   Special Requests NONE  Final   Gram Stain   Final    RARE WBC PRESENT, PREDOMINANTLY PMN RARE GRAM POSITIVE COCCI Performed at [facility], [address] [place], [place] [address]    Culture PENDING  Incomplete   Report Status PENDING  Incomplete     Damon Eke, NP [facility] [phone] Pager  07/30/2017  2:17 PM

## 2017-07-30 NOTE — ED Triage Notes (Addendum)
Patient states that he was at work, having swelling to left ankle and foot and leg.  Patient has a wound mid calf that is weeping pus from it at this time.  Patient states that this has never had this much swelling to that foot/anklke/leg.  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

## 2017-07-30 NOTE — Progress Notes (Addendum)
Left lower extremity venous duplex has been completed. Negative for obvious evidence of DVT. Ultrasound characteristics of a vascular enlarged lymph node measuring 1.7 cm high by 3.5 cm wide by 3.7 cm long noted in the groin.  07/30/17 1:27 PM Damon CordialGreg Nihal Alexander RVT

## 2017-07-30 NOTE — H&P (Signed)
History and Physical    Damon Alexander RSW:[id number] DOB: [birth year] DOA: 07/30/2017  Referring MD/NP/PA:   PCP: Patient, No Pcp Per   Patient coming from:  The patient is coming from home.  At baseline, pt is independent for most of ADL. SNF  Assistant living facility   [facility].       Chief Complaint: left leg pain and swelling  HPI: Damon Alexander is a 32 y.o. male with medical history significant of tobacco abuse, alcohol abuse, questionable hypertension (patient has hypertension in his problem list, but patient denies history of hypertension), seizure, who presents with leg pain and swelling.  Patient states that has a previous history of fracture of left lower leg and ankle with internal fixation last year. 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 He has hardware in left lower leg. He has chronic swelling in left ankle and lower leg. He states that he has worsening pain and swelling in left leg since yesterday.  The pain is constant, sharp, 10 out of 10 in severity, nonradiating, aggravated by movement.  No recent injury.  Patient also has small wound in the anterior mid shin region with pus draining.  Patient states that he does not have fever or chills.  No chest pain, shortness breath, cough, nausea, vomiting, diarrhea, abdominal pain, symptoms of UTI or unilateral weakness.  ED Course: pt was found to have WBC 12.8, lactic acid 1.18, potassium 3.3, creatinine normal, temperature normal, no tachycardia, no tachypnea, oxygen saturation 99% on room air.  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 Pt is admitted to [facility] bed as inpatient.  Review of Systems:   General: no fevers, chills, no body weight gain, has fatigue HEENT: no blurry vision, hearing changes or sore throat Respiratory: no dyspnea,  coughing, wheezing CV: no chest pain, no palpitations GI: no nausea, vomiting, abdominal pain, diarrhea, constipation GU: no dysuria, burning on urination, increased urinary frequency, hematuria  Ext: has left lower leg and ankle swelling and pain Neuro: no unilateral weakness, numbness, or tingling, no vision change or hearing loss Skin: has small wound in left anterior shin with pus draining. MSK: No muscle spasm, no deformity, no limitation of range of movement in spin Heme: No easy bruising.  Travel history: No recent long distant travel.  Allergy:  Allergies  Allergen Reactions  . Ciprofloxacin Other (See Comments)    Caused a seizure    Past Medical History:  Diagnosis Date  . 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 Displaced pilon fracture of left tibia, subsequent encounter for open fracture type IIIA, IIIB, or IIIC with malunion 03/06/2016  . HTN (hypertension) 12/05/2015   pt denies this  . Medical history non-contributory   . Nicotine dependence 10/12/2015  . Seizures ([place])    due to a reaction from Cipro  . Type III open fracture of left tibial plafond with involvement of fibula 10/12/2015    Past Surgical History:  Procedure Laterality Date  . ANKLE FUSION Left 02/12/2016   Procedure: ARTHRODESIS ANKLE;  Surgeon: Altamese Kooskia, MD;  Location: [place];  Service: Orthopedics;  Laterality: Left;  . APPLICATION OF WOUND VAC Left 10/05/2015   Procedure: APPLICATION OF WOUND VAC;  Surgeon: Meredith Pel, MD;  Location: [place];  Service: Orthopedics;  Laterality: Left;  . APPLICATION OF WOUND VAC  Left 10/09/2015   Procedure: APPLICATION OF WOUND VAC;  Surgeon: Altamese [place], MD;  Location: [place];  Service: Orthopedics;  Laterality: Left;  . EXTERNAL FIXATION LEG Left 10/05/2015   Procedure: EXTERNAL FIXATION LEG LEFT ANKLE & LEFT LOWER LEG;  Surgeon: Meredith Pel, MD;  Location: [place];  Service: Orthopedics;  Laterality: Left;  . EXTERNAL FIXATION REMOVAL Left  12/04/2015   Procedure: REMOVAL EXTERNAL FIXATION LEFT LEG;  Surgeon: Altamese [place], MD;  Location: [place];  Service: Orthopedics;  Laterality: Left;  . I&D EXTREMITY Left 10/05/2015   Procedure: IRRIGATION AND DEBRIDEMENT EXTREMITY LEFT ANKLE & LEFT  LOWER LEG,PLACEMENT OF ANTIBIOTIC BEADS;  Surgeon: Meredith Pel, MD;  Location: [place];  Service: Orthopedics;  Laterality: Left;  . I&D EXTREMITY Left 10/09/2015   Procedure: IRRIGATION AND DEBRIDEMENT LEFT ANKLE POSSIBLE EX-FIX ADJUSTMENT;  Surgeon: Altamese , MD;  Location: [facility];  Service: Orthopedics;  Laterality: Left;  . I&D EXTREMITY Left 07/27/2016   Procedure: IRRIGATION AND DEBRIDEMENT EXTREMITY;  Surgeon: Renette Butters, MD;  Location: [place];  Service: Orthopedics;  Laterality: Left;  . LEG SURGERY     "rod in my right leg"  . MANDIBLE FRACTURE SURGERY    . NO PAST SURGERIES    . SKIN SPLIT GRAFT Left 12/04/2015   Procedure: SKIN GRAFT SPLIT THICKNESS LEFT LEG;  Surgeon: Altamese , MD;  Location: [place];  Service: Orthopedics;  Laterality: Left;  . TIBIA IM NAIL INSERTION Right 07/31/2012   Procedure: INTRAMEDULLARY (IM) NAIL TIBIAL;  Surgeon: Newt Minion, MD;  Location: [place];  Service: Orthopedics;  Laterality: Right;  Intramedullary Nail right tib/fib    Social History:  reports that he has been smoking cigarettes.  He has been smoking about 1.00 pack per day. He has never used smokeless tobacco. He reports that he drinks alcohol. He reports that he does not use drugs.  Family History:  Family History  Problem Relation Age of Onset  . Hypertension Mother      Prior to Admission medications   Medication Sig Start Date End Date Taking? Authorizing Provider  aspirin EC 325 MG tablet Take 1 tablet (325 mg total) by mouth every 12 (twelve) hours. Patient not taking: Reported on 07/26/2016 02/13/16   Ainsley Spinner, PA-C  docusate sodium (COLACE) 100 MG capsule Take 1 capsule (100 mg total) by mouth 2 (two) times daily.  02/13/16   Ainsley Spinner, PA-C  HYDROcodone-acetaminophen (NORCO) 5-325 MG tablet Take 1-2 tablets by mouth every 8 (eight) hours as needed for moderate pain. 07/28/16   Ainsley Spinner, PA-C    Physical Exam: Vitals:   07/30/17 0230 07/30/17 0315 07/30/17 0345 07/30/17 0415  BP: (!) 143/86 (!) 143/88 (!) 171/154 (!) 153/62  Pulse: 88 93 (!) 103 (!) 103  Resp:      Temp:      TempSrc:      SpO2: 99% 100% 100% 98%  Weight:      Height:       General: Not in acute distress HEENT:       Eyes: PERRL, EOMI, no scleral icterus.       ENT: No discharge from the ears and nose, no pharynx injection, no tonsillar enlargement.        Neck: No JVD, no bruit, no mass felt. Heme: No neck lymph node enlargement. Cardiac: S1/S2, RRR, No murmurs, No gallops or rubs. Respiratory: No rales, wheezing, rhonchi or rubs. GI: Soft, nondistended, nontender, no rebound pain, no  organomegaly, BS present. GU: No hematuria Ext: 2+DP/PT pulse bilaterally. Has  swelling, warmth and tenderness in the left lower leg and ankle Musculoskeletal: No joint deformities, No joint redness or warmth, no limitation of ROM in spin. Skin: has small wound in left anterior shin with pus draining. Neuro: Alert, oriented X3, cranial nerves II-XII grossly intact, moves all extremities normally.  \Psych: Patient is not psychotic, no suicidal or hemocidal ideation.  Labs on Admission: I have personally reviewed following labs and imaging studies  CBC: Recent Labs  Lab 07/30/17 0045  WBC 12.8*  NEUTROABS 8.1*  HGB 13.3  HCT 41.7  MCV 83.4  PLT 130   Basic Metabolic Panel: Recent Labs  Lab 07/30/17 0045  NA 139  K 3.3*  CL 102  CO2 27  GLUCOSE 98  BUN 8  CREATININE 1.04  CALCIUM 9.1   GFR: Estimated Creatinine Clearance: 122.4 mL/min (by C-G formula based on SCr of 1.04 mg/dL). Liver Function Tests: No results for input(s): AST, ALT, ALKPHOS, BILITOT, PROT, ALBUMIN in the last 168 hours. No results for input(s):  LIPASE, AMYLASE in the last 168 hours. No results for input(s): AMMONIA in the last 168 hours. Coagulation Profile: No results for input(s): INR, PROTIME in the last 168 hours. Cardiac Enzymes: No results for input(s): CKTOTAL, CKMB, CKMBINDEX, TROPONINI in the last 168 hours. BNP (last 3 results) No results for input(s): PROBNP in the last 8760 hours. HbA1C: No results for input(s): HGBA1C in the last 72 hours. CBG: No results for input(s): GLUCAP in the last 168 hours. Lipid Profile: No results for input(s): CHOL, HDL, LDLCALC, TRIG, CHOLHDL, LDLDIRECT in the last 72 hours. Thyroid Function Tests: No results for input(s): TSH, T4TOTAL, FREET4, T3FREE, THYROIDAB in the last 72 hours. Anemia Panel: No results for input(s): VITAMINB12, FOLATE, FERRITIN, TIBC, IRON, RETICCTPCT in the last 72 hours. Urine analysis:    Component Value Date/Time   COLORURINE YELLOW 07/26/2016 0833   APPEARANCEUR CLEAR 07/26/2016 0833   LABSPEC 1.014 07/26/2016 0833   PHURINE 6.0 07/26/2016 0833   GLUCOSEU NEGATIVE 07/26/2016 0833   HGBUR NEGATIVE 07/26/2016 0833   BILIRUBINUR NEGATIVE 07/26/2016 0833   KETONESUR NEGATIVE 07/26/2016 0833   PROTEINUR NEGATIVE 07/26/2016 0833   UROBILINOGEN 0.2 08/18/2011 0748   NITRITE NEGATIVE 07/26/2016 0833   LEUKOCYTESUR NEGATIVE 07/26/2016 0833   Sepsis Labs: '@LABRCNTIP' (procalcitonin:4,lacticidven:4) )No results found for this or any previous visit (from the past 240 hour(s)).   Radiological Exams on Admission: Dg Tibia/fibula Left  Result Date: 07/30/2017 CLINICAL DATA:  32 y/o  M; left leg pain, edema, and leaking poss. EXAM: LEFT FOOT - COMPLETE 3+ VIEW; LEFT TIBIA AND FIBULA - 2 VIEW; LEFT ANKLE COMPLETE - 3+ VIEW COMPARISON:  09/17/2016 left ankle CT. 08/31/2016 left ankle radiographs. FINDINGS: Left foot: There is no evidence of fracture or dislocation. There is no evidence of arthropathy or other focal bone abnormality. Lisfranc alignment is maintained.  Ankle joint fusion as described below. Left tibia and fibula: Multiple lucencies within the tibial diaphysis from prior hardware. Ankle fusion described below. Knee joint is well maintained. No acute fracture or dislocation identified. Left ankle: Lower fibular resection. Lateral plate fixation across the tibiotalar joint. Interval partial bony bridging across the joint. Stable nonunion medial malleolus fracture. Stable backing out of 2 talar dome screws. No new periprosthetic lucency, fracture, or hardware failure identified. No lucent or erosive changes to suggest osteomyelitis. IMPRESSION: 1. No acute fracture or dislocation identified. 2. No findings of osteomyelitis. 3. Plate  fixation across tibiotalar joint. Interval bony bridging across the joint space. No new apparent hardware related complication. 4. Stable nonunion fracture of medial malleolus. Electronically Signed   By: Kristine Garbe M.D.   On: 07/30/2017 01:51   Dg Ankle Complete Left  Result Date: 07/30/2017 CLINICAL DATA:  32 y/o  M; left leg pain, edema, and leaking poss. EXAM: LEFT FOOT - COMPLETE 3+ VIEW; LEFT TIBIA AND FIBULA - 2 VIEW; LEFT ANKLE COMPLETE - 3+ VIEW COMPARISON:  09/17/2016 left ankle CT. 08/31/2016 left ankle radiographs. FINDINGS: Left foot: There is no evidence of fracture or dislocation. There is no evidence of arthropathy or other focal bone abnormality. Lisfranc alignment is maintained. Ankle joint fusion as described below. Left tibia and fibula: Multiple lucencies within the tibial diaphysis from prior hardware. Ankle fusion described below. Knee joint is well maintained. No acute fracture or dislocation identified. Left ankle: Lower fibular resection. Lateral plate fixation across the tibiotalar joint. Interval partial bony bridging across the joint. Stable nonunion medial malleolus fracture. Stable backing out of 2 talar dome screws. No new periprosthetic lucency, fracture, or hardware failure identified. No  lucent or erosive changes to suggest osteomyelitis. IMPRESSION: 1. No acute fracture or dislocation identified. 2. No findings of osteomyelitis. 3. Plate fixation across tibiotalar joint. Interval bony bridging across the joint space. No new apparent hardware related complication. 4. Stable nonunion fracture of medial malleolus. Electronically Signed   By: Kristine Garbe M.D.   On: 07/30/2017 01:51   Dg Foot Complete Left  Result Date: 07/30/2017 CLINICAL DATA:  32 y/o  M; left leg pain, edema, and leaking poss. EXAM: LEFT FOOT - COMPLETE 3+ VIEW; LEFT TIBIA AND FIBULA - 2 VIEW; LEFT ANKLE COMPLETE - 3+ VIEW COMPARISON:  09/17/2016 left ankle CT. 08/31/2016 left ankle radiographs. FINDINGS: Left foot: There is no evidence of fracture or dislocation. There is no evidence of arthropathy or other focal bone abnormality. Lisfranc alignment is maintained. Ankle joint fusion as described below. Left tibia and fibula: Multiple lucencies within the tibial diaphysis from prior hardware. Ankle fusion described below. Knee joint is well maintained. No acute fracture or dislocation identified. Left ankle: Lower fibular resection. Lateral plate fixation across the tibiotalar joint. Interval partial bony bridging across the joint. Stable nonunion medial malleolus fracture. Stable backing out of 2 talar dome screws. No new periprosthetic lucency, fracture, or hardware failure identified. No lucent or erosive changes to suggest osteomyelitis. IMPRESSION: 1. No acute fracture or dislocation identified. 2. No findings of osteomyelitis. 3. Plate fixation across tibiotalar joint. Interval bony bridging across the joint space. No new apparent hardware related complication. 4. Stable nonunion fracture of medial malleolus. Electronically Signed   By: Kristine Garbe M.D.   On: 07/30/2017 01:51     EKG:  Not done in ED, will get one.   Assessment/Plan   Left leg cellulitis and wound infection in left  anterior shin area leukocytosis: pt has with WBC 12.8, but no fever.  Does not meet criteria for sepsis.  Hemodynamically stable.  - will admit to med-surg bed as inpt - Empiric antimicrobial treatment with vancomycin - PRN Zofran for nausea, dilaudid and norco for pain - Blood cultures x 2  - ESR and CRP - wound culture - wound care consult - LE doppler to r/o DVT - may need to consult ID in AM  Tobacco abuse and Alcohol abuse: -Did counseling about importance of quitting smoking -Nicotine patch -Did counseling about the importance of quitting drinking -CIWA  protocol  HTN (hypertension): patient denies history of hypertension, but he carries diagnosis in his medical problem list - prn  hydralazine:  Hypokalemia: K= 3.3 -repleted  DVT ppx: SQ Lovenox Code Status: Full code Family Communication: None at bed side. Disposition Plan:  Anticipate discharge back to previous home environment Consults called:  none Admission status: medical floor/inpt   Date of Service 07/30/2017   Ivor Costa [facility] Pager [phone]  If 7PM-7AM, please contact night-coverage [email or web address] Password [facility] 07/30/2017, 4:29 AM

## 2017-07-30 NOTE — Consult Note (Addendum)
WOC Nurse wound consult note Reason for Consult:left pretibial wound full thickness wound Wound type:infectious Pressure Injury POA: NA Measurement:0.1cm round unknown depth Wound ZOX:WRUEAVbed:unable to be seen Drainage (amount, consistency, odor) tan pus Periwound:indurated,warmth Dressing procedure/placement/frequency:I have provided nurses with orders for NS moist to dry dressings TID to keep head off wound to allow for drainage. Pt had MRSA in this same wound last June. Note states wound culture to be done, do not see where that has occurred. Patient is not on isolation. Will attempt to notify bedside RN.  We will not follow, but will remain available to this patient, to nursing, and the medical and/or surgical teams.  Please re-consult if we need to assist further.  Barnett HatterMelinda Alayzha An, RN-C, WTA-C, OCA Wound Treatment Associate Ostomy Care Associate

## 2017-07-31 ENCOUNTER — Inpatient Hospital Stay (HOSPITAL_COMMUNITY): Payer: Medicaid Other

## 2017-07-31 DIAGNOSIS — F17200 Nicotine dependence, unspecified, uncomplicated: Secondary | ICD-10-CM

## 2017-07-31 LAB — BASIC METABOLIC PANEL
ANION GAP: 10 (ref 5–15)
BUN: <5 mg/dL — ABNORMAL LOW (ref 6–20)
CHLORIDE: 101 mmol/L (ref 98–111)
CO2: 25 mmol/L (ref 22–32)
Calcium: 9 mg/dL (ref 8.9–10.3)
Creatinine, Ser: 0.88 mg/dL (ref 0.61–1.24)
GFR calc non Af Amer: >60 mL/min (ref >60)
Glucose, Bld: 108 mg/dL — ABNORMAL HIGH (ref 70–99)
POTASSIUM: 3.5 mmol/L (ref 3.5–5.1)
Sodium: 136 mmol/L (ref 135–145)

## 2017-07-31 LAB — CBC
HEMATOCRIT: 39.3 % (ref 39.0–52.0)
HEMOGLOBIN: 12.6 g/dL — AB (ref 13.0–17.0)
MCH: 26.9 pg (ref 26.0–34.0)
MCHC: 32.1 g/dL (ref 30.0–36.0)
MCV: 83.8 fL (ref 78.0–100.0)
Platelets: 250 10*3/uL (ref 150–400)
RBC: 4.69 MIL/uL (ref 4.22–5.81)
RDW: 13.7 % (ref 11.5–15.5)
WBC: 13 10*3/uL — ABNORMAL HIGH (ref 4.0–10.5)

## 2017-07-31 MED ORDER — SODIUM CHLORIDE 0.9 % IV SOLN
2.0000 g | INTRAVENOUS | Status: DC
  Administered 2017-07-31 – 2017-08-01 (×2): 2 g via INTRAVENOUS
  Filled 2017-07-31 (×2): qty 20

## 2017-07-31 MED ORDER — GADOBENATE DIMEGLUMINE 529 MG/ML IV SOLN
20.0000 mL | Freq: Once | INTRAVENOUS | Status: AC
  Administered 2017-07-31: 20 mL via INTRAVENOUS

## 2017-07-31 MED ORDER — HYDROCODONE-ACETAMINOPHEN 5-325 MG PO TABS
2.0000 | ORAL_TABLET | Freq: Four times a day (QID) | ORAL | Status: DC | PRN
  Administered 2017-08-01 – 2017-08-02 (×4): 2 via ORAL
  Filled 2017-07-31 (×5): qty 2

## 2017-07-31 MED ORDER — HYDROMORPHONE HCL 1 MG/ML IJ SOLN
1.5000 mg | INTRAMUSCULAR | Status: DC | PRN
  Administered 2017-07-31 – 2017-08-02 (×10): 1.5 mg via INTRAVENOUS
  Filled 2017-07-31 (×10): qty 2

## 2017-07-31 NOTE — Care Management Note (Signed)
Case Management Note  Patient Details  Name: Emilia BeckSamuel D Hund MRN: [id number] Date of Birth: 1985-11-16  Subjective/Objective:                 Spoke w patient at bedside, he confirms that he has no insurance and is unable to pay out of pocket for MD appointments. [facility] not taking calls to schedule hospital follow ups until AUG 1st. Patient lives outside the service area for [facility]. MRI + for osteo, will continue to follow.    Action/Plan:  Will need to assess meds for MATCH needs at DC.   If HH needed, [facility] have [facility], but would also need PCP established.  Expected Discharge Date:  07/08/18               Expected Discharge Plan:  Home/Self Care  In-House Referral:     Discharge planning Services  CM Consult  Post Acute Care Choice:    Choice offered to:     DME Arranged:    DME Agency:     HH Arranged:    HH Agency:     Status of Service:  In process, will continue to follow  If discussed at Long Length of Stay Meetings, dates discussed:    Additional Comments:  Lawerance SabalDebbie Ilyas Lipsitz, RN 07/31/2017, 11:37 AM

## 2017-07-31 NOTE — Progress Notes (Signed)
[facility]  Date of Admission:  07/30/2017     Total days of antibiotics 2         ASSESSMENT/PLAN  Damon Alexander is a 32 y/o male with previous history of left non-union fracture with MRSA abscess presenting with new onset left lower extremity edema found on MRI to have osteomyelitis, abscess, and likely hardware infection. Awaiting orthopedics to determine course for surgical intervention. Febrile overnight with a temperature of 101.8.  1. Continue current dosage of vancomycin. Given continued fevers will add ceftriaxone as cultures remain with no growth. He will require prolonged course of antibiotics pending surgical intervention.  2. Continue to monitor cultures. Kidney function stable with no evidence of nephrotoxicity in lab work.   Dr. Daiva EvesVan Dam will follow over the weekend.   Principal Problem:   Left leg cellulitis Active Problems:   Alcohol abuse   Nicotine dependence   HTN (hypertension)   Hypokalemia   Left leg swelling   . docusate sodium  100 mg Oral BID  . enoxaparin (LOVENOX) injection  40 mg Subcutaneous Q24H  . folic acid  1 mg Oral Daily  . lidocaine-EPINEPHrine  10 mL Intradermal Once  . LORazepam  0-4 mg Intravenous Q6H   Followed by  . [START ON 08/01/2017] LORazepam  0-4 mg Intravenous Q12H  . multivitamin with minerals  1 tablet Oral Daily  . nicotine  21 mg Transdermal Daily  . thiamine  100 mg Oral Daily   Or  . thiamine  100 mg Intravenous Daily    SUBJECTIVE:  Fever of 101.8 yesterday afternoon with WBC of 13. MRI of the tibia/fibula with osteomyelitis and intramedullary abscess with sinus tract through the anterior cortex of the mid tibia. Blood cultures reviewed with no growth to date. Continues to have the associated symptom of pain located in his left ankle and lower leg.   Allergies  Allergen Reactions  . Ciprofloxacin Other (See Comments)    Caused a seizure     Review of Systems: Review of Systems    Constitutional: Positive for fever. Negative for chills.  Respiratory: Negative for cough, sputum production, shortness of breath and wheezing.   Cardiovascular: Negative for chest pain and leg swelling.  Gastrointestinal: Negative for abdominal pain, constipation, diarrhea, nausea and vomiting.  Musculoskeletal:       Positive for left lower extremity pain.   Skin: Negative for rash.      OBJECTIVE: Vitals:   07/30/17 2102 07/30/17 2307 07/31/17 0447 07/31/17 1132  BP: 127/81 137/76 128/73 133/83  Pulse: (!) 104 97 (!) 102 92  Resp: 18 20 18 20   Temp: 99.4 F (37.4 C) 99.9 F (37.7 C) 100.1 F (37.8 C) 98.9 F (37.2 C)  TempSrc: Oral Oral Oral Oral  SpO2: 99% 100% 98% 99%  Weight:      Height:       Body mass index is 33.97 kg/m.  Physical Exam  Constitutional: He is oriented to person, place, and time. He appears well-developed and well-nourished. No distress.  Cardiovascular: Normal rate, regular rhythm, normal heart sounds and intact distal pulses. Exam reveals no gallop and no friction rub.  No murmur heard. Pulmonary/Chest: Effort normal and breath sounds normal. No stridor. No respiratory distress. He has no wheezes. He has no rales. He exhibits no tenderness.  Musculoskeletal:  Left lower extremity with edema and redness. No obvious deformity. Generalized tenderness of the ankle and distal tibia. Pulses and sensation are intact and appropriate.  Neurological: He is alert and oriented to person, place, and time.  Skin: Skin is warm and dry.  Psychiatric: He has a normal mood and affect. His behavior is normal. Judgment and thought content normal.    Lab Results Lab Results  Component Value Date   WBC 13.0 (H) 07/31/2017   HGB 12.6 (L) 07/31/2017   HCT 39.3 07/31/2017   MCV 83.8 07/31/2017   PLT 250 07/31/2017    Lab Results  Component Value Date   CREATININE 0.88 07/31/2017   BUN <5 (L) 07/31/2017   NA 136 07/31/2017   K 3.5 07/31/2017   CL 101  07/31/2017   CO2 25 07/31/2017    Lab Results  Component Value Date   ALT 11 (L) 07/26/2016   AST 18 07/26/2016   ALKPHOS 101 07/26/2016   BILITOT 0.5 07/26/2016     Microbiology: Recent Results (from the past 240 hour(s))  Wound or Superficial Culture     Status: None (Preliminary result)   Collection Time: 07/30/17 12:30 AM  Result Value Ref Range Status   Specimen Description LEG  Final   Special Requests NONE  Final   Gram Stain   Final    RARE WBC PRESENT, PREDOMINANTLY PMN RARE GRAM POSITIVE COCCI Performed at [facility], [address], [place], [place] [address]    Culture FEW STAPHYLOCOCCUS AUREUS  Final   Report Status PENDING  Incomplete  Blood culture (routine x 2)     Status: None (Preliminary result)   Collection Time: 07/30/17 12:33 AM  Result Value Ref Range Status   Specimen Description BLOOD RIGHT ARM  Final   Special Requests   Final    BOTTLES DRAWN AEROBIC AND ANAEROBIC Blood Culture results may not be optimal due to an excessive volume of blood received in culture bottles   Culture   Final    NO GROWTH 1 DAY Performed at [facility], [address], [place], [place] [address]    Report Status PENDING  Incomplete  Blood culture (routine x 2)     Status: None (Preliminary result)   Collection Time: 07/30/17 12:45 AM  Result Value Ref Range Status   Specimen Description BLOOD LEFT ARM  Final   Special Requests   Final    BOTTLES DRAWN AEROBIC AND ANAEROBIC Blood Culture results may not be optimal due to an excessive volume of blood received in culture bottles   Culture   Final    NO GROWTH 1 DAY Performed at [facility], [address], [place], [place] [address]    Report Status PENDING  Incomplete  Surgical pcr screen     Status: None   Collection Time: 07/30/17  8:29 PM  Result Value Ref Range Status   MRSA, PCR NEGATIVE NEGATIVE Final   Staphylococcus aureus NEGATIVE NEGATIVE Final    Comment: (NOTE) The Xpert SA Assay  (FDA approved for NASAL specimens in patients 55 years of age and older), is one component of a comprehensive surveillance program. It is not intended to diagnose infection nor to guide or monitor treatment. Performed at [facility], [address], [place], [place] [address]      Marcos Eke, NP [facility] [phone] Pager  07/31/2017  11:57 AM

## 2017-07-31 NOTE — Progress Notes (Addendum)
PROGRESS NOTE   Damon FolksSamuel D Alexander  WUJ:[id number]    DOB: 07-14-1985    DOA: 07/30/2017  PCP: Patient, No Pcp Per   I have briefly reviewed patients previous medical [facility].  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  Admitted for left leg cellulitis, possible abscess on top of nonunion fracture.  MRI confirms osteomyelitis, intramedullary abscess of mid to distal tibial diaphysis with sinus tract and cellulitis.  ID and orthopedics consulting.    Assessment & Plan:   Principal Problem:   Left leg cellulitis Active Problems:   Alcohol abuse   Nicotine dependence   HTN (hypertension)   Hypokalemia   Left leg swelling   Left leg cellulitis, osteomyelitis and intramedullary abscess of the mid to distal tibial diaphysis with sinus tract, complicating left nonunion tibia-fibula fracture: ID was consulted.  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  Continue empirically started IV Zosyn.  Lower extremity venous Doppler negative for DVT.  X-rays do not show osteomyelitis.  CT of left ankle shows loosening of hardware.  MRI of left leg confirms osteomyelitis, intramedullary abscess, sinus tract, cellulitis.  ID added ceftriaxone 6/28.  Blood cultures x2: Negative to date.  Superficial wound culture shows few Staphylococcus aureus, final results pending.  Orthopedics on board, discussed with them and decision regarding surgical intervention pending.  Inadequate pain control, adjusted medications.  Tobacco abuse: Cessation counseled.  Continue nicotine patch.  Alcohol abuse Continue CIWA protocol.  No overt withdrawal at this time.  Possible hypertension:  Patient denies.  Continue PRN IV hydralazine.  Pain control.  Improved control.  Hypokalemia Replaced.  Magnesium 1.8.   DVT  prophylaxis: Lovenox Code Status: Full Family Communication: Discussed in detail with patient's fianc at bedside. Disposition: To be determined pending clinical improvement   Consultants:  Infectious disease Orthopedics.  Procedures:  None  Antimicrobials:  IV vancomycin IV ceftriaxone 6/28 >   Subjective: Seen after MRI this morning.  Fianc at bedside.  States that left leg pain is better compared to yesterday, down from 10/10 to 6/10 but still inadequately controlled with current pain meds.  Requesting adjusting dose.  Denies any other complaints.  ROS: As above.  Objective:  Vitals:   07/30/17 2102 07/30/17 2307 07/31/17 0447 07/31/17 1132  BP: 127/81 137/76 128/73 133/83  Pulse: (!) 104 97 (!) 102 92  Resp: 18 20 18 20   Temp: 99.4 F (37.4 C) 99.9 F (37.7 C) 100.1 F (37.8 C) 98.9 F (37.2 C)  TempSrc: Oral Oral Oral Oral  SpO2: 99% 100% 98% 99%  Weight:      Height:        Examination:  General exam: Pleasant young male, well-built and nourished, sitting up in bed in mild painful discomfort. Respiratory system: Clear to auscultation. Respiratory effort normal.  Stable. Cardiovascular system: S1 & S2 heard, RRR. No JVD, murmurs, rubs, gallops or clicks. No pedal edema.  Stable. Gastrointestinal system: Abdomen is nondistended, soft and nontender. No organomegaly or masses felt. Normal bowel sounds heard.  Stable. Central nervous system: Alert and oriented. No focal neurological deficits.  Stable. Extremities: Symmetric 5 x 5 power.  Left leg swelling, increased warmth, tenderness and faint redness.  Swelling most prominent in the lateral aspect of left ankle.  Tiny wound anterior mid shin which was not draining.  Not much change in  findings compared to yesterday. Skin: No rashes, lesions or ulcers Psychiatry: Judgement and insight appear normal. Mood & affect appropriate.     Data Reviewed: I have personally reviewed following labs and imaging  studies  CBC: Recent Labs  Lab 07/30/17 0045 07/30/17 0433 07/31/17 1104  WBC 12.8* 12.9* 13.0*  NEUTROABS 8.1*  --   --   HGB 13.3 12.2* 12.6*  HCT 41.7 37.9* 39.3  MCV 83.4 82.8 83.8  PLT 300 266 250   Basic Metabolic Panel: Recent Labs  Lab 07/30/17 0045 07/30/17 0433 07/31/17 1104  NA 139 136 136  K 3.3* 3.2* 3.5  CL 102 103 101  CO2 27 27 25   GLUCOSE 98 130* 108*  BUN 8 9 <5*  CREATININE 1.04 0.96 0.88  CALCIUM 9.1 8.3* 9.0  MG  --  1.8  --     Recent Results (from the past 240 hour(s))  Wound or Superficial Culture     Status: None (Preliminary result)   Collection Time: 07/30/17 12:30 AM  Result Value Ref Range Status   Specimen Description LEG AEROBIC SWAB ONLY SENT  Final   Special Requests NONE  Final   Gram Stain   Final    RARE WBC PRESENT, PREDOMINANTLY PMN RARE GRAM POSITIVE COCCI Performed at [facility], [address], [place], [place] [address]    Culture FEW STAPHYLOCOCCUS AUREUS  Final   Report Status PENDING  Incomplete  Blood culture (routine x 2)     Status: None (Preliminary result)   Collection Time: 07/30/17 12:33 AM  Result Value Ref Range Status   Specimen Description BLOOD RIGHT ARM  Final   Special Requests   Final    BOTTLES DRAWN AEROBIC AND ANAEROBIC Blood Culture results may not be optimal due to an excessive volume of blood received in culture bottles   Culture   Final    NO GROWTH 1 DAY Performed at [facility], [address], [place], [place] [address]    Report Status PENDING  Incomplete  Blood culture (routine x 2)     Status: None (Preliminary result)   Collection Time: 07/30/17 12:45 AM  Result Value Ref Range Status   Specimen Description BLOOD LEFT ARM  Final   Special Requests   Final    BOTTLES DRAWN AEROBIC AND ANAEROBIC Blood Culture results may not be optimal due to an excessive volume of blood received in culture bottles   Culture   Final    NO GROWTH 1 DAY Performed at [facility]  Lab, [address], [place], [place] [address]    Report Status PENDING  Incomplete  Surgical pcr screen     Status: None   Collection Time: 07/30/17  8:29 PM  Result Value Ref Range Status   MRSA, PCR NEGATIVE NEGATIVE Final   Staphylococcus aureus NEGATIVE NEGATIVE Final    Comment: (NOTE) The Xpert SA Assay (FDA approved for NASAL specimens in patients 3 years of age and older), is one component of a comprehensive surveillance program. It is not intended to diagnose infection nor to guide or monitor treatment. Performed at [facility], [address] [place], [place] [address]          Radiology Studies: Dg Tibia/fibula Left  Result Date: 07/30/2017 CLINICAL DATA:  32 y/o  M; left leg pain, edema, and leaking poss. EXAM: LEFT FOOT - COMPLETE 3+ VIEW; LEFT TIBIA AND FIBULA - 2 VIEW; LEFT ANKLE COMPLETE - 3+  VIEW COMPARISON:  09/17/2016 left ankle CT. 08/31/2016 left ankle radiographs. FINDINGS: Left foot: There is no evidence of fracture or dislocation. There is no evidence of arthropathy or other focal bone abnormality. Lisfranc alignment is maintained. Ankle joint fusion as described below. Left tibia and fibula: Multiple lucencies within the tibial diaphysis from prior hardware. Ankle fusion described below. Knee joint is well maintained. No acute fracture or dislocation identified. Left ankle: Lower fibular resection. Lateral plate fixation across the tibiotalar joint. Interval partial bony bridging across the joint. Stable nonunion medial malleolus fracture. Stable backing out of 2 talar dome screws. No new periprosthetic lucency, fracture, or hardware failure identified. No lucent or erosive changes to suggest osteomyelitis. IMPRESSION: 1. No acute fracture or dislocation identified. 2. No findings of osteomyelitis. 3. Plate fixation across tibiotalar joint. Interval bony bridging across the joint space. No new apparent hardware related complication. 4. Stable nonunion fracture  of medial malleolus. Electronically Signed   By: Mitzi Hansen M.D.   On: 07/30/2017 01:51   Dg Ankle Complete Left  Result Date: 07/30/2017 CLINICAL DATA:  32 y/o  M; left leg pain, edema, and leaking poss. EXAM: LEFT FOOT - COMPLETE 3+ VIEW; LEFT TIBIA AND FIBULA - 2 VIEW; LEFT ANKLE COMPLETE - 3+ VIEW COMPARISON:  09/17/2016 left ankle CT. 08/31/2016 left ankle radiographs. FINDINGS: Left foot: There is no evidence of fracture or dislocation. There is no evidence of arthropathy or other focal bone abnormality. Lisfranc alignment is maintained. Ankle joint fusion as described below. Left tibia and fibula: Multiple lucencies within the tibial diaphysis from prior hardware. Ankle fusion described below. Knee joint is well maintained. No acute fracture or dislocation identified. Left ankle: Lower fibular resection. Lateral plate fixation across the tibiotalar joint. Interval partial bony bridging across the joint. Stable nonunion medial malleolus fracture. Stable backing out of 2 talar dome screws. No new periprosthetic lucency, fracture, or hardware failure identified. No lucent or erosive changes to suggest osteomyelitis. IMPRESSION: 1. No acute fracture or dislocation identified. 2. No findings of osteomyelitis. 3. Plate fixation across tibiotalar joint. Interval bony bridging across the joint space. No new apparent hardware related complication. 4. Stable nonunion fracture of medial malleolus. Electronically Signed   By: Mitzi Hansen M.D.   On: 07/30/2017 01:51   Ct Ankle Left Wo Contrast  Result Date: 07/31/2017 CLINICAL DATA:  Ankle pain EXAM: CT OF THE LEFT ANKLE WITHOUT CONTRAST TECHNIQUE: Multidetector CT imaging of the left ankle was performed according to the standard protocol. Multiplanar CT image reconstructions were also generated. COMPARISON:  09/17/2016 CT, 07/30/2017 radiographs FINDINGS: Bones/Joint/Cartilage 1. Resected distal fibular diaphysis including the lateral  malleolus. Resected margin is rounded in appearance without evidence of bone destruction. 2. Plate and screw fixation noted along the lateral aspect of the distal tibial diaphysis traversing the tibiotalar joint with changes tibiotalar arthrodesis noted. No significant bony bridging across the tibiotalar joint. 3. The 2 most anterior screws along the anterior aspect of the talus demonstrate lucencies about the threads up to 1.8 mm, series 3/84 and 80 raising possibility of subtle early loosening. 4. A small metallic density that appears to have the shape of the head of one of the screws appears slightly displaced and possibly fractured. Series 3/82 and 83 5. Lucency surrounding the threads of the most caudal tibial plafond screw, series 3/61 and series 8/66 also raise the possibility of loosening. Lesser degrees of lucency are noted of the more cephalad tibial screws. 6. Osteopenic appearance of the mid and  hindfoot. Ghost track noted along the posterior calcaneus from prior external fixation. Ligaments Suboptimally assessed by CT. Muscles and Tendons Muscular atrophy. No focal mass or hematoma. Intact Achilles tendon. The visualized tendons crossing the ankle joint appear intact without tenosynovitis or entrapment. Soft tissues Soft tissue swelling more so laterally is noted. IMPRESSION: 1. Resected distal fibular diaphysis including the lateral malleolus. Resected margin is rounded in appearance without evidence of bone destruction. 2. Plate and screw fixation noted along the lateral aspect of the distal tibial diaphysis traversing the tibiotalar joint with changes of tibiotalar arthrodesis noted. No significant bony bridging is seen however across the tibiotalar joint. 3. The 2 most anterior screws along the anterior aspect of the talus demonstrate lucencies about the threads up to 1.8 mm, series 3/84 and 80 raising possibility of subtle early loosening. 4. A small metallic density that appears to have the shape  of the head of one of the screws appears slightly displaced and possibly fractured. Series 3/82 and 83 5. Lucency surrounding the threads of the most caudal tibial plafond screw, series 3/61 and series 8/66 also raise the possibility of loosening. Lesser degrees of lucency surrounding the threads of the more cephalad tibial screws are believed less likely to represent loosening. 6. Osteopenic appearance of the mid and hindfoot. Ghost track noted along the posterior calcaneus from prior external fixation. 7. Soft tissue swelling about the ankle more so laterally. Electronically Signed   By: Tollie Eth M.D.   On: 07/31/2017 01:24   Mr Tibia Fibula Left W Wo Contrast  Result Date: 07/31/2017 CLINICAL DATA:  Status post tibiotalar fusion with chronic lower leg and ankle swelling. Wound in the anterior mid shin draining pus. Evaluate for osteomyelitis. EXAM: MRI OF LOWER LEFT EXTREMITY WITHOUT AND WITH CONTRAST TECHNIQUE: Multiplanar, multisequence MR imaging of the left tibia and fibula was performed both before and after administration of intravenous contrast. CONTRAST:  20mL MULTIHANCE GADOBENATE DIMEGLUMINE 529 MG/ML IV SOLN COMPARISON:  CT left ankle dated July 30, 2017. FINDINGS: Bones/Joint/Cartilage Prior left lateral distal tibia plate and screw fixation and tibiotalar arthrodesis. There is abnormal marrow edema with corresponding T1 hypointensity within the mid to distal tibial diaphyseal intramedullary canal, extending to the proximal aspect of the hardware, with a focal sinus tract through the anterior cortex of the mid tibial diaphysis extending to the skin (series 9, image 30). There are small areas of rim enhancing intramedullary fluid, consistent with abscess. Evaluation of the distal tibia is limited due to hardware susceptibility artifact. No definite sequestrum. Prior resection of the distal fibula. Prior intramedullary rod placement within the right tibia with healed distal tibial diaphyseal  fracture. Muscles and Tendons Mild muscle edema within the distal extensor digitorum longus, posterior tibialis, and extensor digitorum longus muscles. No muscle atrophy. The visualized flexor, extensor, peroneal, and Achilles tendons are intact. Soft tissues Mild enhancing soft tissue swelling along the distal lower leg. Small rim enhancing soft tissue fluid collection along the lateral aspect of the lower hardware at the ankle, measuring approximately 1.6 x 1.6 x 3.2 cm. IMPRESSION: 1. Osteomyelitis and intramedullary abscess of the mid to distal tibial diaphysis extending to the proximal ankle hardware, with focal sinus tract through the anterior cortex of the mid tibial diaphysis extending to the skin. No definite sequestrum. 2. Evaluation of the distal tibia is limited due to hardware susceptibility artifact, however given clear extension of infection to the proximal hardware, the remaining distal tibia is likely involved as well. 3. Cellulitis of  the distal lower leg. Small 3.2 cm soft tissue abscess lateral to the distal hardware at the ankle. Electronically Signed   By: Obie Dredge M.D.   On: 07/31/2017 11:19   Dg Foot Complete Left  Result Date: 07/30/2017 CLINICAL DATA:  32 y/o  M; left leg pain, edema, and leaking poss. EXAM: LEFT FOOT - COMPLETE 3+ VIEW; LEFT TIBIA AND FIBULA - 2 VIEW; LEFT ANKLE COMPLETE - 3+ VIEW COMPARISON:  09/17/2016 left ankle CT. 08/31/2016 left ankle radiographs. FINDINGS: Left foot: There is no evidence of fracture or dislocation. There is no evidence of arthropathy or other focal bone abnormality. Lisfranc alignment is maintained. Ankle joint fusion as described below. Left tibia and fibula: Multiple lucencies within the tibial diaphysis from prior hardware. Ankle fusion described below. Knee joint is well maintained. No acute fracture or dislocation identified. Left ankle: Lower fibular resection. Lateral plate fixation across the tibiotalar joint. Interval partial  bony bridging across the joint. Stable nonunion medial malleolus fracture. Stable backing out of 2 talar dome screws. No new periprosthetic lucency, fracture, or hardware failure identified. No lucent or erosive changes to suggest osteomyelitis. IMPRESSION: 1. No acute fracture or dislocation identified. 2. No findings of osteomyelitis. 3. Plate fixation across tibiotalar joint. Interval bony bridging across the joint space. No new apparent hardware related complication. 4. Stable nonunion fracture of medial malleolus. Electronically Signed   By: Mitzi Hansen M.D.   On: 07/30/2017 01:51        Scheduled Meds: . docusate sodium  100 mg Oral BID  . enoxaparin (LOVENOX) injection  40 mg Subcutaneous Q24H  . folic acid  1 mg Oral Daily  . lidocaine-EPINEPHrine  10 mL Intradermal Once  . LORazepam  0-4 mg Intravenous Q6H   Followed by  . [START ON 08/01/2017] LORazepam  0-4 mg Intravenous Q12H  . multivitamin with minerals  1 tablet Oral Daily  . nicotine  21 mg Transdermal Daily  . thiamine  100 mg Oral Daily   Or  . thiamine  100 mg Intravenous Daily   Continuous Infusions: . cefTRIAXone (ROCEPHIN)  IV 2 g (07/31/17 1410)  . vancomycin 1,000 mg (07/31/17 1146)     LOS: 1 day     Marcellus Scott, MD, FACP, [facility]. [facility] Pager [phone]  If 7PM-7AM, please contact night-coverage [email or web address] Password [facility] 07/31/2017, 3:40 PM

## 2017-08-01 DIAGNOSIS — M86462 Chronic osteomyelitis with draining sinus, left tibia and fibula: Secondary | ICD-10-CM

## 2017-08-01 DIAGNOSIS — Z720 Tobacco use: Secondary | ICD-10-CM

## 2017-08-01 DIAGNOSIS — M869 Osteomyelitis, unspecified: Secondary | ICD-10-CM

## 2017-08-01 LAB — CBC
HEMATOCRIT: 37.1 % — AB (ref 39.0–52.0)
HEMOGLOBIN: 11.9 g/dL — AB (ref 13.0–17.0)
MCH: 26.5 pg (ref 26.0–34.0)
MCHC: 32.1 g/dL (ref 30.0–36.0)
MCV: 82.6 fL (ref 78.0–100.0)
Platelets: 277 10*3/uL (ref 150–400)
RBC: 4.49 MIL/uL (ref 4.22–5.81)
RDW: 13.2 % (ref 11.5–15.5)
WBC: 11.2 10*3/uL — ABNORMAL HIGH (ref 4.0–10.5)

## 2017-08-01 LAB — AEROBIC CULTURE  (SUPERFICIAL SPECIMEN)

## 2017-08-01 LAB — BASIC METABOLIC PANEL
Anion gap: 10 (ref 5–15)
CHLORIDE: 95 mmol/L — AB (ref 98–111)
CO2: 28 mmol/L (ref 22–32)
Calcium: 8.6 mg/dL — ABNORMAL LOW (ref 8.9–10.3)
Creatinine, Ser: 0.89 mg/dL (ref 0.61–1.24)
GFR calc Af Amer: >60 mL/min (ref >60)
GFR calc non Af Amer: >60 mL/min (ref >60)
GLUCOSE: 143 mg/dL — AB (ref 70–99)
POTASSIUM: 3.3 mmol/L — AB (ref 3.5–5.1)
SODIUM: 133 mmol/L — AB (ref 135–145)

## 2017-08-01 LAB — AEROBIC CULTURE W GRAM STAIN (SUPERFICIAL SPECIMEN)

## 2017-08-01 MED ORDER — POTASSIUM CHLORIDE CRYS ER 20 MEQ PO TBCR
40.0000 meq | EXTENDED_RELEASE_TABLET | Freq: Once | ORAL | Status: AC
  Administered 2017-08-01: 40 meq via ORAL
  Filled 2017-08-01: qty 2

## 2017-08-01 MED ORDER — HYDROMORPHONE HCL 1 MG/ML IJ SOLN
INTRAMUSCULAR | Status: AC
  Filled 2017-08-01: qty 1

## 2017-08-01 NOTE — H&P (View-Only) (Signed)
  ORTHOPAEDIC CONSULTATION  REQUESTING PHYSICIAN: Hongalgi, Anand D, MD  Chief Complaint: Pain and draining abscess left leg.  HPI: Damon Alexander is a 32 y.o. male who presents with 2-year history of unable to weight-bear on his left leg.  He has had a distal fibular excision debridement for osteomyelitis fusion for the left ankle.  Patient has persistent osteomyelitis with a draining cloaca.  He has a painful nonunion of the ankle fusion.  Patient is a smoker currently has a nicotine patch.  Past Medical History:  Diagnosis Date  . 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 Displaced pilon fracture of left tibia, subsequent encounter for open fracture type IIIA, IIIB, or IIIC with malunion 03/06/2016  . HTN (hypertension) 12/05/2015   pt denies this  . Medical history non-contributory   . Nicotine dependence 10/12/2015  . 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  . Type III open fracture of left tibial plafond with involvement of fibula 10/12/2015   Past Surgical History:  Procedure Laterality Date  . ANKLE FUSION Left 02/12/2016   Procedure: ARTHRODESIS ANKLE;  Surgeon: Michael Handy, MD;  Location: [facility];  Service: Orthopedics;  Laterality: Left;  . APPLICATION OF WOUND VAC Left 10/05/2015   Procedure: APPLICATION OF WOUND VAC;  Surgeon: Scott Gregory Dean, MD;  Location: [facility];  Service: Orthopedics;  Laterality: Left;  . APPLICATION OF WOUND VAC Left 10/09/2015   Procedure: APPLICATION OF WOUND VAC;  Surgeon: Michael Handy, MD;  Location: [facility];  Service: Orthopedics;  Laterality: Left;  . EXTERNAL FIXATION LEG Left 10/05/2015   Procedure: EXTERNAL FIXATION LEG LEFT ANKLE & LEFT LOWER LEG;  Surgeon: Scott Gregory Dean, MD;  Location: [facility];  Service: Orthopedics;  Laterality: Left;  . EXTERNAL FIXATION REMOVAL Left 12/04/2015   Procedure: REMOVAL EXTERNAL FIXATION LEFT LEG;  Surgeon: Michael Handy, MD;  Location: [facility];  Service: Orthopedics;  Laterality: Left;  . I&D  EXTREMITY Left 10/05/2015   Procedure: IRRIGATION AND DEBRIDEMENT EXTREMITY LEFT ANKLE & LEFT  LOWER LEG,PLACEMENT OF ANTIBIOTIC BEADS;  Surgeon: Scott Gregory Dean, MD;  Location: [facility];  Service: Orthopedics;  Laterality: Left;  . I&D EXTREMITY Left 10/09/2015   Procedure: IRRIGATION AND DEBRIDEMENT LEFT ANKLE POSSIBLE EX-FIX ADJUSTMENT;  Surgeon: Michael Handy, MD;  Location: [facility];  Service: Orthopedics;  Laterality: Left;  . I&D EXTREMITY Left 07/27/2016   Procedure: IRRIGATION AND DEBRIDEMENT EXTREMITY;  Surgeon: Murphy, Timothy D, MD;  Location: [facility];  Service: Orthopedics;  Laterality: Left;  . LEG SURGERY     "rod in my right leg"  . MANDIBLE FRACTURE SURGERY    . NO PAST SURGERIES    . SKIN SPLIT GRAFT Left 12/04/2015   Procedure: SKIN GRAFT SPLIT THICKNESS LEFT LEG;  Surgeon: Michael Handy, MD;  Location: [facility];  Service: Orthopedics;  Laterality: Left;  . TIBIA IM NAIL INSERTION Right 07/31/2012   Procedure: INTRAMEDULLARY (IM) NAIL TIBIAL;  Surgeon: Khristin Keleher V Fontaine Hehl, MD;  Location: [facility];  Service: Orthopedics;  Laterality: Right;  Intramedullary Nail right tib/fib   Social History   Socioeconomic History  . Marital status: Single    Spouse name: Not on file  . Number of children: Not on file  . Years of education: Not on file  . Highest education level: Not on file  Occupational History  . Not on file  Social Needs  . Financial resource strain: Not on file  . Food insecurity:      Worry: Not on file    Inability: Not on file  . Transportation needs:    Medical: Not on file    Non-medical: Not on file  Tobacco Use  . Smoking status: Current Every Day Smoker    Packs/day: 1.00    Types: Cigarettes  . Smokeless tobacco: Never Used  Substance and Sexual Activity  . Alcohol use: Yes  . Drug use: No  . Sexual activity: Not on file  Lifestyle  . Physical activity:    Days per week: Not on file    Minutes per session: Not on file  . Stress: Not on file  Relationships  .  Social connections:    Talks on phone: Not on file    Gets together: Not on file    Attends religious service: Not on file    Active member of club or organization: Not on file    Attends meetings of clubs or organizations: Not on file    Relationship status: Not on file  Other Topics Concern  . Not on file  Social History Narrative   ** Merged History Encounter **       Family History  Problem Relation Age of Onset  . Hypertension Mother    - negative except otherwise stated in the family history section Allergies  Allergen Reactions  . Ciprofloxacin Other (See Comments)    Caused a seizure   Prior to Admission medications   Medication Sig Start Date End Date Taking? Authorizing Provider  aspirin EC 325 MG tablet Take 1 tablet (325 mg total) by mouth every 12 (twelve) hours. Patient taking differently: Take 325 mg by mouth every 12 (twelve) hours as needed (for swelling or pain).  02/13/16  Yes Paul, Keith, PA-C  ibuprofen (ADVIL,MOTRIN) 200 MG tablet Take 400 mg by mouth every 6 (six) hours as needed (for swelling).   Yes [provider]   Ct Ankle Left Wo Contrast  Result Date: 07/31/2017 CLINICAL DATA:  Ankle pain EXAM: CT OF THE LEFT ANKLE WITHOUT CONTRAST TECHNIQUE: Multidetector CT imaging of the left ankle was performed according to the standard protocol. Multiplanar CT image reconstructions were also generated. COMPARISON:  09/17/2016 CT, 07/30/2017 radiographs FINDINGS: Bones/Joint/Cartilage 1. Resected distal fibular diaphysis including the lateral malleolus. Resected margin is rounded in appearance without evidence of bone destruction. 2. Plate and screw fixation noted along the lateral aspect of the distal tibial diaphysis traversing the tibiotalar joint with changes tibiotalar arthrodesis noted. No significant bony bridging across the tibiotalar joint. 3. The 2 most anterior screws along the anterior aspect of the talus demonstrate lucencies about the threads up  to 1.8 mm, series 3/84 and 80 raising possibility of subtle early loosening. 4. A small metallic density that appears to have the shape of the head of one of the screws appears slightly displaced and possibly fractured. Series 3/82 and 83 5. Lucency surrounding the threads of the most caudal tibial plafond screw, series 3/61 and series 8/66 also raise the possibility of loosening. Lesser degrees of lucency are noted of the more cephalad tibial screws. 6. Osteopenic appearance of the mid and hindfoot. Ghost track noted along the posterior calcaneus from prior external fixation. Ligaments Suboptimally assessed by CT. Muscles and Tendons Muscular atrophy. No focal mass or hematoma. Intact Achilles tendon. The visualized tendons crossing the ankle joint appear intact without tenosynovitis or entrapment. Soft tissues Soft tissue swelling more so laterally is noted. IMPRESSION: 1. Resected distal fibular diaphysis including the lateral malleolus. Resected margin   is rounded in appearance without evidence of bone destruction. 2. Plate and screw fixation noted along the lateral aspect of the distal tibial diaphysis traversing the tibiotalar joint with changes of tibiotalar arthrodesis noted. No significant bony bridging is seen however across the tibiotalar joint. 3. The 2 most anterior screws along the anterior aspect of the talus demonstrate lucencies about the threads up to 1.8 mm, series 3/84 and 80 raising possibility of subtle early loosening. 4. A small metallic density that appears to have the shape of the head of one of the screws appears slightly displaced and possibly fractured. Series 3/82 and 83 5. Lucency surrounding the threads of the most caudal tibial plafond screw, series 3/61 and series 8/66 also raise the possibility of loosening. Lesser degrees of lucency surrounding the threads of the more cephalad tibial screws are believed less likely to represent loosening. 6. Osteopenic appearance of the mid and  hindfoot. Ghost track noted along the posterior calcaneus from prior external fixation. 7. Soft tissue swelling about the ankle more so laterally. Electronically Signed   By: David  Kwon M.D.   On: 07/31/2017 01:24   Mr Tibia Fibula Left W Wo Contrast  Result Date: 07/31/2017 CLINICAL DATA:  Status post tibiotalar fusion with chronic lower leg and ankle swelling. Wound in the anterior mid shin draining pus. Evaluate for osteomyelitis. EXAM: MRI OF LOWER LEFT EXTREMITY WITHOUT AND WITH CONTRAST TECHNIQUE: Multiplanar, multisequence MR imaging of the left tibia and fibula was performed both before and after administration of intravenous contrast. CONTRAST:  20mL MULTIHANCE GADOBENATE DIMEGLUMINE 529 MG/ML IV SOLN COMPARISON:  CT left ankle dated July 30, 2017. FINDINGS: Bones/Joint/Cartilage Prior left lateral distal tibia plate and screw fixation and tibiotalar arthrodesis. There is abnormal marrow edema with corresponding T1 hypointensity within the mid to distal tibial diaphyseal intramedullary canal, extending to the proximal aspect of the hardware, with a focal sinus tract through the anterior cortex of the mid tibial diaphysis extending to the skin (series 9, image 30). There are small areas of rim enhancing intramedullary fluid, consistent with abscess. Evaluation of the distal tibia is limited due to hardware susceptibility artifact. No definite sequestrum. Prior resection of the distal fibula. Prior intramedullary rod placement within the right tibia with healed distal tibial diaphyseal fracture. Muscles and Tendons Mild muscle edema within the distal extensor digitorum longus, posterior tibialis, and extensor digitorum longus muscles. No muscle atrophy. The visualized flexor, extensor, peroneal, and Achilles tendons are intact. Soft tissues Mild enhancing soft tissue swelling along the distal lower leg. Small rim enhancing soft tissue fluid collection along the lateral aspect of the lower hardware at the  ankle, measuring approximately 1.6 x 1.6 x 3.2 cm. IMPRESSION: 1. Osteomyelitis and intramedullary abscess of the mid to distal tibial diaphysis extending to the proximal ankle hardware, with focal sinus tract through the anterior cortex of the mid tibial diaphysis extending to the skin. No definite sequestrum. 2. Evaluation of the distal tibia is limited due to hardware susceptibility artifact, however given clear extension of infection to the proximal hardware, the remaining distal tibia is likely involved as well. 3. Cellulitis of the distal lower leg. Small 3.2 cm soft tissue abscess lateral to the distal hardware at the ankle. Electronically Signed   By: William T Derry M.D.   On: 07/31/2017 11:19   - pertinent xrays, CT, MRI studies were reviewed and independently interpreted  Positive ROS: All other systems have been reviewed and were otherwise negative with the exception of those mentioned in   the HPI and as above.  Physical Exam: General: Alert, no acute distress Psychiatric: Patient is competent for consent with normal mood and affect Lymphatic: No axillary or cervical lymphadenopathy Cardiovascular: No pedal edema Respiratory: No cyanosis, no use of accessory musculature GI: No organomegaly, abdomen is soft and non-tender    Images:  @ENCIMAGES@  Labs:  Lab Results  Component Value Date   ESRSEDRATE 15 07/30/2017   ESRSEDRATE 3 08/31/2016   ESRSEDRATE 1 02/12/2016   CRP 7.8 (H) 07/30/2017   CRP <0.8 08/31/2016   CRP <0.8 02/12/2016   REPTSTATUS PENDING 07/30/2017   GRAMSTAIN  07/30/2017    RARE WBC PRESENT, PREDOMINANTLY PMN RARE GRAM POSITIVE COCCI Performed at [facility], [address] [place], [place] [address]    CULT  07/30/2017    NO GROWTH 2 DAYS Performed at [facility], [address] [place], [place] [address]    LABORGA METHICILLIN RESISTANT STAPHYLOCOCCUS AUREUS 07/30/2017    Lab Results  Component Value Date   ALBUMIN 3.6  07/26/2016   ALBUMIN 3.8 02/12/2016   ALBUMIN 3.5 10/08/2015    Neurologic: Patient does not have protective sensation bilateral lower extremities.   MUSCULOSKELETAL:   Skin: Examination patient has a draining sinus tract anteriorly over the junction of the middle and distal third of the tibia.  Patient has pain to palpation or and swelling around the ankle.  Review of the CT scan shows a nonunion of the ankle fusion with loosening of the hardware.  Review of the MRI scan shows a large abscess with extensive osteomyelitis of the distal tibia.  Patient has good hair growth to mid tibia.  No plantar or venous stasis ulcers in the left leg.  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  Plan: Discussed with the patient his best option is to proceed with a left below the knee amputation.  Feel the patient should be able to be fit for prosthesis in 2 months.  Risks and benefits of surgery were discussed patient states he understands, patient states he would like to proceed with surgery and be able to leave the hospital by Wednesday.  Plan for surgery tomorrow morning Sunday at 730.  Discussed the importance of smoking cessation for surgical healing.  Thank you for the consult and the opportunity to see Damon Alexander  Judea Riches, MD [facility] [phone] 3:07 PM     

## 2017-08-01 NOTE — Progress Notes (Signed)
PROGRESS NOTE   Damon Alexander  ZOX:[id number]    DOB: 10-Aug-1985    DOA: 07/30/2017  PCP: Patient, No Pcp Per   I have briefly reviewed patients previous medical records in [facility].  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  Admitted for left leg cellulitis, possible abscess on top of nonunion fracture.  MRI confirms osteomyelitis, intramedullary abscess of mid to distal tibial diaphysis with sinus tract and cellulitis.  ID and orthopedics consulting.  At this point it is unclear as to who will be operating on his left leg.  I have discussed with Dr. Lajoyce Corners 6/29 and he will see him.   Assessment & Plan:   Principal Problem:   Left leg cellulitis Active Problems:   Alcohol abuse   Nicotine dependence   HTN (hypertension)   Hypokalemia   Left leg swelling   Left leg cellulitis, osteomyelitis and intramedullary abscess of the mid to distal tibial diaphysis with sinus tract, complicating left nonunion tibia-fibula fracture: ID was consulted.  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  Continue empirically started IV Zosyn.  Lower extremity venous Doppler negative for DVT.  X-rays do not show osteomyelitis.  CT of left ankle shows loosening of hardware.  MRI of left leg confirms osteomyelitis, intramedullary abscess of the mid to distal tibial diaphysis extending to the proximal ankle hardware, sinus tract, cellulitis.  ID added ceftriaxone 6/28.  ID recommends 6 to 8 weeks of IV antibiotics after surgical debridement.  Likely PICC line placement 7/1.  Blood cultures x2: Negative to date.  Superficial wound culture shows few MRSA.  At this time unclear as to which orthopedic MD would be operating on him.  I discussed with Dr. Lajoyce Corners who will see him today to further clarify.  Since MRSA on wound  culture, discussed with pharmacy and discontinued ceftriaxone.  Pain control.  Tobacco abuse: Cessation counseled.  Continue nicotine patch.  Alcohol abuse Continue CIWA protocol.  No overt withdrawal at this time.  Possible hypertension:  Patient denies.  Continue PRN IV hydralazine.  Pain control.  Improved control.  Hypokalemia Replace and follow.  Magnesium 1.8.  Anemia: Likely due to acute illness.  Follow CBCs daily.   DVT prophylaxis: Lovenox Code Status: Full Family Communication: None at bedside today Disposition: To be determined pending clinical improvement   Consultants:  Infectious disease Orthopedics.  Procedures:  None  Antimicrobials:  IV vancomycin IV ceftriaxone 6/28 >   Subjective: Ongoing pain in left leg but better compared to yesterday.  Wants to know when he is having surgery for his left leg.  No other complaints reported.  Febrile overnight 102.1 F.  ROS: As above.  Objective:  Vitals:   07/31/17 2227 08/01/17 0048 08/01/17 0609 08/01/17 1005  BP: 140/88  134/83 130/78  Pulse: (!) 111  93 90  Resp: 16  16 18   Temp: (!) 102.1 F (38.9 C) 99.4 F (37.4 C) 98.6 F (37 C) 99.1 F (37.3 C)  TempSrc: Oral Oral Oral Oral  SpO2: 100%  98% 98%  Weight: 104.3 kg (229 lb 15 oz)     Height:        Examination:  General exam: Pleasant young male, well-built and nourished, sitting up in bed in mild painful discomfort. Respiratory system: Clear to auscultation. Respiratory effort normal.  Stable Cardiovascular system: S1 & S2  heard, RRR. No JVD, murmurs, rubs, gallops or clicks. No pedal edema.  Stable Gastrointestinal system: Abdomen is nondistended, soft and nontender. No organomegaly or masses felt. Normal bowel sounds heard.  Stable Central nervous system: Alert and oriented. No focal neurological deficits.  Stable Extremities: Symmetric 5 x 5 power.  Left leg swelling, increased warmth (may be slightly less compared to yesterday),  tenderness and faint redness.  Swelling most prominent in the lateral aspect of left ankle.  Tiny wound anterior mid shin which was not draining.  Otherwise no significant change over the last 2 days. Skin: No rashes, lesions or ulcers Psychiatry: Judgement and insight appear normal. Mood & affect appropriate.     Data Reviewed: I have personally reviewed following labs and imaging studies  CBC: Recent Labs  Lab 07/30/17 0045 07/30/17 0433 07/31/17 1104 08/01/17 0850  WBC 12.8* 12.9* 13.0* 11.2*  NEUTROABS 8.1*  --   --   --   HGB 13.3 12.2* 12.6* 11.9*  HCT 41.7 37.9* 39.3 37.1*  MCV 83.4 82.8 83.8 82.6  PLT 300 266 250 277   Basic Metabolic Panel: Recent Labs  Lab 07/30/17 0045 07/30/17 0433 07/31/17 1104 08/01/17 0850  NA 139 136 136 133*  K 3.3* 3.2* 3.5 3.3*  CL 102 103 101 95*  CO2 27 27 25 28   GLUCOSE 98 130* 108* 143*  BUN 8 9 <5* <5*  CREATININE 1.04 0.96 0.88 0.89  CALCIUM 9.1 8.3* 9.0 8.6*  MG  --  1.8  --   --     Recent Results (from the past 240 hour(s))  Wound or Superficial Culture     Status: None   Collection Time: 07/30/17 12:30 AM  Result Value Ref Range Status   Specimen Description LEG AEROBIC SWAB ONLY SENT  Final   Special Requests NONE  Final   Gram Stain   Final    RARE WBC PRESENT, PREDOMINANTLY PMN RARE GRAM POSITIVE COCCI Performed at [facility], [address] [place], [place] [address]    Culture FEW METHICILLIN RESISTANT STAPHYLOCOCCUS AUREUS  Final   Report Status 08/01/2017 FINAL  Final   Organism ID, Bacteria METHICILLIN RESISTANT STAPHYLOCOCCUS AUREUS  Final      Susceptibility   Methicillin resistant staphylococcus aureus - MIC*    CIPROFLOXACIN >=8 RESISTANT Resistant     ERYTHROMYCIN >=8 RESISTANT Resistant     GENTAMICIN <=0.5 SENSITIVE Sensitive     OXACILLIN >=4 RESISTANT Resistant     TETRACYCLINE <=1 SENSITIVE Sensitive     VANCOMYCIN <=0.5 SENSITIVE Sensitive     TRIMETH/SULFA <=10 SENSITIVE Sensitive      CLINDAMYCIN <=0.25 SENSITIVE Sensitive     RIFAMPIN <=0.5 SENSITIVE Sensitive     Inducible Clindamycin NEGATIVE Sensitive     * FEW METHICILLIN RESISTANT STAPHYLOCOCCUS AUREUS  Blood culture (routine x 2)     Status: None (Preliminary result)   Collection Time: 07/30/17 12:33 AM  Result Value Ref Range Status   Specimen Description BLOOD RIGHT ARM  Final   Special Requests   Final    BOTTLES DRAWN AEROBIC AND ANAEROBIC Blood Culture results may not be optimal due to an excessive volume of blood received in culture bottles   Culture   Final    NO GROWTH 2 DAYS Performed at [facility], [address] [place], [place] [address]    Report Status PENDING  Incomplete  Blood culture (routine x 2)     Status: None (Preliminary result)  Collection Time: 07/30/17 12:45 AM  Result Value Ref Range Status   Specimen Description BLOOD LEFT ARM  Final   Special Requests   Final    BOTTLES DRAWN AEROBIC AND ANAEROBIC Blood Culture results may not be optimal due to an excessive volume of blood received in culture bottles   Culture   Final    NO GROWTH 2 DAYS Performed at [facility], [address] [place], [place] [address]    Report Status PENDING  Incomplete  Surgical pcr screen     Status: None   Collection Time: 07/30/17  8:29 PM  Result Value Ref Range Status   MRSA, PCR NEGATIVE NEGATIVE Final   Staphylococcus aureus NEGATIVE NEGATIVE Final    Comment: (NOTE) The Xpert SA Assay (FDA approved for NASAL specimens in patients 3 years of age and older), is one component of a comprehensive surveillance program. It is not intended to diagnose infection nor to guide or monitor treatment. Performed at [facility], [address] [place], [place] [address]          Radiology Studies: Ct Ankle Left Wo Contrast  Result Date: 07/31/2017 CLINICAL DATA:  Ankle pain EXAM: CT OF THE LEFT ANKLE WITHOUT CONTRAST TECHNIQUE: Multidetector CT imaging of the left ankle  was performed according to the standard protocol. Multiplanar CT image reconstructions were also generated. COMPARISON:  09/17/2016 CT, 07/30/2017 radiographs FINDINGS: Bones/Joint/Cartilage 1. Resected distal fibular diaphysis including the lateral malleolus. Resected margin is rounded in appearance without evidence of bone destruction. 2. Plate and screw fixation noted along the lateral aspect of the distal tibial diaphysis traversing the tibiotalar joint with changes tibiotalar arthrodesis noted. No significant bony bridging across the tibiotalar joint. 3. The 2 most anterior screws along the anterior aspect of the talus demonstrate lucencies about the threads up to 1.8 mm, series 3/84 and 80 raising possibility of subtle early loosening. 4. A small metallic density that appears to have the shape of the head of one of the screws appears slightly displaced and possibly fractured. Series 3/82 and 83 5. Lucency surrounding the threads of the most caudal tibial plafond screw, series 3/61 and series 8/66 also raise the possibility of loosening. Lesser degrees of lucency are noted of the more cephalad tibial screws. 6. Osteopenic appearance of the mid and hindfoot. Ghost track noted along the posterior calcaneus from prior external fixation. Ligaments Suboptimally assessed by CT. Muscles and Tendons Muscular atrophy. No focal mass or hematoma. Intact Achilles tendon. The visualized tendons crossing the ankle joint appear intact without tenosynovitis or entrapment. Soft tissues Soft tissue swelling more so laterally is noted. IMPRESSION: 1. Resected distal fibular diaphysis including the lateral malleolus. Resected margin is rounded in appearance without evidence of bone destruction. 2. Plate and screw fixation noted along the lateral aspect of the distal tibial diaphysis traversing the tibiotalar joint with changes of tibiotalar arthrodesis noted. No significant bony bridging is seen however across the tibiotalar  joint. 3. The 2 most anterior screws along the anterior aspect of the talus demonstrate lucencies about the threads up to 1.8 mm, series 3/84 and 80 raising possibility of subtle early loosening. 4. A small metallic density that appears to have the shape of the head of one of the screws appears slightly displaced and possibly fractured. Series 3/82 and 83 5. Lucency surrounding the threads of the most caudal tibial plafond screw, series 3/61 and series 8/66 also raise the possibility of loosening. Lesser degrees of lucency surrounding  the threads of the more cephalad tibial screws are believed less likely to represent loosening. 6. Osteopenic appearance of the mid and hindfoot. Ghost track noted along the posterior calcaneus from prior external fixation. 7. Soft tissue swelling about the ankle more so laterally. Electronically Signed   By: Tollie Eth M.D.   On: 07/31/2017 01:24   Mr Tibia Fibula Left W Wo Contrast  Result Date: 07/31/2017 CLINICAL DATA:  Status post tibiotalar fusion with chronic lower leg and ankle swelling. Wound in the anterior mid shin draining pus. Evaluate for osteomyelitis. EXAM: MRI OF LOWER LEFT EXTREMITY WITHOUT AND WITH CONTRAST TECHNIQUE: Multiplanar, multisequence MR imaging of the left tibia and fibula was performed both before and after administration of intravenous contrast. CONTRAST:  20mL MULTIHANCE GADOBENATE DIMEGLUMINE 529 MG/ML IV SOLN COMPARISON:  CT left ankle dated July 30, 2017. FINDINGS: Bones/Joint/Cartilage Prior left lateral distal tibia plate and screw fixation and tibiotalar arthrodesis. There is abnormal marrow edema with corresponding T1 hypointensity within the mid to distal tibial diaphyseal intramedullary canal, extending to the proximal aspect of the hardware, with a focal sinus tract through the anterior cortex of the mid tibial diaphysis extending to the skin (series 9, image 30). There are small areas of rim enhancing intramedullary fluid, consistent  with abscess. Evaluation of the distal tibia is limited due to hardware susceptibility artifact. No definite sequestrum. Prior resection of the distal fibula. Prior intramedullary rod placement within the right tibia with healed distal tibial diaphyseal fracture. Muscles and Tendons Mild muscle edema within the distal extensor digitorum longus, posterior tibialis, and extensor digitorum longus muscles. No muscle atrophy. The visualized flexor, extensor, peroneal, and Achilles tendons are intact. Soft tissues Mild enhancing soft tissue swelling along the distal lower leg. Small rim enhancing soft tissue fluid collection along the lateral aspect of the lower hardware at the ankle, measuring approximately 1.6 x 1.6 x 3.2 cm. IMPRESSION: 1. Osteomyelitis and intramedullary abscess of the mid to distal tibial diaphysis extending to the proximal ankle hardware, with focal sinus tract through the anterior cortex of the mid tibial diaphysis extending to the skin. No definite sequestrum. 2. Evaluation of the distal tibia is limited due to hardware susceptibility artifact, however given clear extension of infection to the proximal hardware, the remaining distal tibia is likely involved as well. 3. Cellulitis of the distal lower leg. Small 3.2 cm soft tissue abscess lateral to the distal hardware at the ankle. Electronically Signed   By: Obie Dredge M.D.   On: 07/31/2017 11:19        Scheduled Meds: . docusate sodium  100 mg Oral BID  . enoxaparin (LOVENOX) injection  40 mg Subcutaneous Q24H  . folic acid  1 mg Oral Daily  . LORazepam  0-4 mg Intravenous Q12H  . multivitamin with minerals  1 tablet Oral Daily  . nicotine  21 mg Transdermal Daily  . thiamine  100 mg Oral Daily   Or  . thiamine  100 mg Intravenous Daily   Continuous Infusions: . cefTRIAXone (ROCEPHIN)  IV 2 g (08/01/17 1239)  . vancomycin 1,000 mg (08/01/17 1034)     LOS: 2 days     Marcellus Scott, MD, FACP, [facility]. [facility] Pager [phone]  If 7PM-7AM, please contact night-coverage [email or web address] Password [email or web address] 08/01/2017, 1:32 PM

## 2017-08-01 NOTE — Consult Note (Addendum)
ORTHOPAEDIC CONSULTATION  REQUESTING PHYSICIAN: [name], MD  Chief Complaint: Pain and draining abscess left leg.  HPI: Damon Alexander is a 32 y.o. male who presents with 2-year history of unable to weight-bear on his left leg.  He has had a distal fibular excision debridement for osteomyelitis fusion for the left ankle.  Patient has persistent osteomyelitis with a draining cloaca.  He has a painful nonunion of the ankle fusion.  Patient is a smoker currently has a nicotine patch.  Past Medical History:  Diagnosis Date  . 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 Displaced pilon fracture of left tibia, subsequent encounter for open fracture type IIIA, IIIB, or IIIC with malunion 03/06/2016  . HTN (hypertension) 12/05/2015   pt denies this  . Medical history non-contributory   . Nicotine dependence 10/12/2015  . 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  . Type III open fracture of left tibial plafond with involvement of fibula 10/12/2015   Past Surgical History:  Procedure Laterality Date  . ANKLE FUSION Left 02/12/2016   Procedure: ARTHRODESIS ANKLE;  Surgeon: Myrene Galas, MD;  Location: [facility] OR;  Service: Orthopedics;  Laterality: Left;  . APPLICATION OF WOUND VAC Left 10/05/2015   Procedure: APPLICATION OF WOUND VAC;  Surgeon: Cammy Copa, MD;  Location: [facility] OR;  Service: Orthopedics;  Laterality: Left;  . APPLICATION OF WOUND VAC Left 10/09/2015   Procedure: APPLICATION OF WOUND VAC;  Surgeon: Myrene Galas, MD;  Location: [facility] OR;  Service: Orthopedics;  Laterality: Left;  . EXTERNAL FIXATION LEG Left 10/05/2015   Procedure: EXTERNAL FIXATION LEG LEFT ANKLE & LEFT LOWER LEG;  Surgeon: Cammy Copa, MD;  Location: [facility] OR;  Service: Orthopedics;  Laterality: Left;  . EXTERNAL FIXATION REMOVAL Left 12/04/2015   Procedure: REMOVAL EXTERNAL FIXATION LEFT LEG;  Surgeon: Myrene Galas, MD;  Location: [facility] OR;  Service: Orthopedics;  Laterality: Left;  . I&D  EXTREMITY Left 10/05/2015   Procedure: IRRIGATION AND DEBRIDEMENT EXTREMITY LEFT ANKLE & LEFT  LOWER LEG,PLACEMENT OF ANTIBIOTIC BEADS;  Surgeon: Cammy Copa, MD;  Location: [facility] OR;  Service: Orthopedics;  Laterality: Left;  . I&D EXTREMITY Left 10/09/2015   Procedure: IRRIGATION AND DEBRIDEMENT LEFT ANKLE POSSIBLE EX-FIX ADJUSTMENT;  Surgeon: Myrene Galas, MD;  Location: [facility] OR;  Service: Orthopedics;  Laterality: Left;  . I&D EXTREMITY Left 07/27/2016   Procedure: IRRIGATION AND DEBRIDEMENT EXTREMITY;  Surgeon: Sheral Apley, MD;  Location: [facility] OR;  Service: Orthopedics;  Laterality: Left;  . LEG SURGERY     "rod in my right leg"  . MANDIBLE FRACTURE SURGERY    . NO PAST SURGERIES    . SKIN SPLIT GRAFT Left 12/04/2015   Procedure: SKIN GRAFT SPLIT THICKNESS LEFT LEG;  Surgeon: Myrene Galas, MD;  Location: [facility] OR;  Service: Orthopedics;  Laterality: Left;  . TIBIA IM NAIL INSERTION Right 07/31/2012   Procedure: INTRAMEDULLARY (IM) NAIL TIBIAL;  Surgeon: Nadara Mustard, MD;  Location: [facility] OR;  Service: Orthopedics;  Laterality: Right;  Intramedullary Nail right tib/fib   Social History   Socioeconomic History  . Marital status: Single    Spouse name: Not on file  . Number of children: Not on file  . Years of education: Not on file  . Highest education level: Not on file  Occupational History  . Not on file  Social Needs  . Financial resource strain: Not on file  . Food insecurity:  Worry: Not on file    Inability: Not on file  . Transportation needs:    Medical: Not on file    Non-medical: Not on file  Tobacco Use  . Smoking status: Current Every Day Smoker    Packs/day: 1.00    Types: Cigarettes  . Smokeless tobacco: Never Used  Substance and Sexual Activity  . Alcohol use: Yes  . Drug use: No  . Sexual activity: Not on file  Lifestyle  . Physical activity:    Days per week: Not on file    Minutes per session: Not on file  . Stress: Not on file  Relationships  .  Social connections:    Talks on phone: Not on file    Gets together: Not on file    Attends religious service: Not on file    Active member of club or organization: Not on file    Attends meetings of clubs or organizations: Not on file    Relationship status: Not on file  Other Topics Concern  . Not on file  Social History Narrative   ** Merged History Encounter **       Family History  Problem Relation Age of Onset  . Hypertension Mother    - negative except otherwise stated in the family history section Allergies  Allergen Reactions  . Ciprofloxacin Other (See Comments)    Caused a seizure   Prior to Admission medications   Medication Sig Start Date End Date Taking? Authorizing Provider  aspirin EC 325 MG tablet Take 1 tablet (325 mg total) by mouth every 12 (twelve) hours. Patient taking differently: Take 325 mg by mouth every 12 (twelve) hours as needed (for swelling or pain).  02/13/16  Yes Montez Morita, PA-C  ibuprofen (ADVIL,MOTRIN) 200 MG tablet Take 400 mg by mouth every 6 (six) hours as needed (for swelling).   Yes [provider]   Ct Ankle Left Wo Contrast  Result Date: 07/31/2017 CLINICAL DATA:  Ankle pain EXAM: CT OF THE LEFT ANKLE WITHOUT CONTRAST TECHNIQUE: Multidetector CT imaging of the left ankle was performed according to the standard protocol. Multiplanar CT image reconstructions were also generated. COMPARISON:  09/17/2016 CT, 07/30/2017 radiographs FINDINGS: Bones/Joint/Cartilage 1. Resected distal fibular diaphysis including the lateral malleolus. Resected margin is rounded in appearance without evidence of bone destruction. 2. Plate and screw fixation noted along the lateral aspect of the distal tibial diaphysis traversing the tibiotalar joint with changes tibiotalar arthrodesis noted. No significant bony bridging across the tibiotalar joint. 3. The 2 most anterior screws along the anterior aspect of the talus demonstrate lucencies about the threads up  to 1.8 mm, series 3/84 and 80 raising possibility of subtle early loosening. 4. A small metallic density that appears to have the shape of the head of one of the screws appears slightly displaced and possibly fractured. Series 3/82 and 83 5. Lucency surrounding the threads of the most caudal tibial plafond screw, series 3/61 and series 8/66 also raise the possibility of loosening. Lesser degrees of lucency are noted of the more cephalad tibial screws. 6. Osteopenic appearance of the mid and hindfoot. Ghost track noted along the posterior calcaneus from prior external fixation. Ligaments Suboptimally assessed by CT. Muscles and Tendons Muscular atrophy. No focal mass or hematoma. Intact Achilles tendon. The visualized tendons crossing the ankle joint appear intact without tenosynovitis or entrapment. Soft tissues Soft tissue swelling more so laterally is noted. IMPRESSION: 1. Resected distal fibular diaphysis including the lateral malleolus. Resected margin  is rounded in appearance without evidence of bone destruction. 2. Plate and screw fixation noted along the lateral aspect of the distal tibial diaphysis traversing the tibiotalar joint with changes of tibiotalar arthrodesis noted. No significant bony bridging is seen however across the tibiotalar joint. 3. The 2 most anterior screws along the anterior aspect of the talus demonstrate lucencies about the threads up to 1.8 mm, series 3/84 and 80 raising possibility of subtle early loosening. 4. A small metallic density that appears to have the shape of the head of one of the screws appears slightly displaced and possibly fractured. Series 3/82 and 83 5. Lucency surrounding the threads of the most caudal tibial plafond screw, series 3/61 and series 8/66 also raise the possibility of loosening. Lesser degrees of lucency surrounding the threads of the more cephalad tibial screws are believed less likely to represent loosening. 6. Osteopenic appearance of the mid and  hindfoot. Ghost track noted along the posterior calcaneus from prior external fixation. 7. Soft tissue swelling about the ankle more so laterally. Electronically Signed   By: Tollie Ethavid  Kwon M.D.   On: 07/31/2017 01:24   Mr Tibia Fibula Left W Wo Contrast  Result Date: 07/31/2017 CLINICAL DATA:  Status post tibiotalar fusion with chronic lower leg and ankle swelling. Wound in the anterior mid shin draining pus. Evaluate for osteomyelitis. EXAM: MRI OF LOWER LEFT EXTREMITY WITHOUT AND WITH CONTRAST TECHNIQUE: Multiplanar, multisequence MR imaging of the left tibia and fibula was performed both before and after administration of intravenous contrast. CONTRAST:  20mL MULTIHANCE GADOBENATE DIMEGLUMINE 529 MG/ML IV SOLN COMPARISON:  CT left ankle dated July 30, 2017. FINDINGS: Bones/Joint/Cartilage Prior left lateral distal tibia plate and screw fixation and tibiotalar arthrodesis. There is abnormal marrow edema with corresponding T1 hypointensity within the mid to distal tibial diaphyseal intramedullary canal, extending to the proximal aspect of the hardware, with a focal sinus tract through the anterior cortex of the mid tibial diaphysis extending to the skin (series 9, image 30). There are small areas of rim enhancing intramedullary fluid, consistent with abscess. Evaluation of the distal tibia is limited due to hardware susceptibility artifact. No definite sequestrum. Prior resection of the distal fibula. Prior intramedullary rod placement within the right tibia with healed distal tibial diaphyseal fracture. Muscles and Tendons Mild muscle edema within the distal extensor digitorum longus, posterior tibialis, and extensor digitorum longus muscles. No muscle atrophy. The visualized flexor, extensor, peroneal, and Achilles tendons are intact. Soft tissues Mild enhancing soft tissue swelling along the distal lower leg. Small rim enhancing soft tissue fluid collection along the lateral aspect of the lower hardware at the  ankle, measuring approximately 1.6 x 1.6 x 3.2 cm. IMPRESSION: 1. Osteomyelitis and intramedullary abscess of the mid to distal tibial diaphysis extending to the proximal ankle hardware, with focal sinus tract through the anterior cortex of the mid tibial diaphysis extending to the skin. No definite sequestrum. 2. Evaluation of the distal tibia is limited due to hardware susceptibility artifact, however given clear extension of infection to the proximal hardware, the remaining distal tibia is likely involved as well. 3. Cellulitis of the distal lower leg. Small 3.2 cm soft tissue abscess lateral to the distal hardware at the ankle. Electronically Signed   By: Obie DredgeWilliam T Derry M.D.   On: 07/31/2017 11:19   - pertinent xrays, CT, MRI studies were reviewed and independently interpreted  Positive ROS: All other systems have been reviewed and were otherwise negative with the exception of those mentioned in  the HPI and as above.  Physical Exam: General: Alert, no acute distress Psychiatric: Patient is competent for consent with normal mood and affect Lymphatic: No axillary or cervical lymphadenopathy Cardiovascular: No pedal edema Respiratory: No cyanosis, no use of accessory musculature GI: No organomegaly, abdomen is soft and non-tender    Images:  @ENCIMAGES @  Labs:  Lab Results  Component Value Date   ESRSEDRATE 15 07/30/2017   ESRSEDRATE 3 08/31/2016   ESRSEDRATE 1 02/12/2016   CRP 7.8 (H) 07/30/2017   CRP <0.8 08/31/2016   CRP <0.8 02/12/2016   REPTSTATUS PENDING 07/30/2017   GRAMSTAIN  07/30/2017    RARE WBC PRESENT, PREDOMINANTLY PMN RARE GRAM POSITIVE COCCI Performed at [facility], [address], [place], [place] [address]    CULT  07/30/2017    NO GROWTH 2 DAYS Performed at [facility], [address], [place], [place] [address]    LABORGA METHICILLIN RESISTANT STAPHYLOCOCCUS AUREUS 07/30/2017    Lab Results  Component Value Date   ALBUMIN 3.6  07/26/2016   ALBUMIN 3.8 02/12/2016   ALBUMIN 3.5 10/08/2015    Neurologic: Patient does not have protective sensation bilateral lower extremities.   MUSCULOSKELETAL:   Skin: Examination patient has a draining sinus tract anteriorly over the junction of the middle and distal third of the tibia.  Patient has pain to palpation or and swelling around the ankle.  Review of the CT scan shows a nonunion of the ankle fusion with loosening of the hardware.  Review of the MRI scan shows a large abscess with extensive osteomyelitis of the distal tibia.  Patient has good hair growth to mid tibia.  No plantar or venous stasis ulcers in the left leg.  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  Plan: Discussed with the patient his best option is to proceed with a left below the knee amputation.  Feel the patient should be able to be fit for prosthesis in 2 months.  Risks and benefits of surgery were discussed patient states he understands, patient states he would like to proceed with surgery and be able to leave the hospital by Wednesday.  Plan for surgery tomorrow morning Sunday at 730.  Discussed the importance of smoking cessation for surgical healing.  Thank you for the consult and the opportunity to see Mr. Samuell Knoble, MD [facility] [phone] 3:07 PM

## 2017-08-02 ENCOUNTER — Encounter (HOSPITAL_COMMUNITY)
Admission: EM | Disposition: A | Discharge status: Home / Self Care | Payer: Self-pay | Source: Home / Self Care | Attending: Internal Medicine

## 2017-08-02 ENCOUNTER — Encounter (HOSPITAL_COMMUNITY): Payer: Self-pay | Admitting: Certified Registered"

## 2017-08-02 ENCOUNTER — Inpatient Hospital Stay (HOSPITAL_COMMUNITY): Payer: Medicaid Other | Admitting: Certified Registered"

## 2017-08-02 DIAGNOSIS — F17210 Nicotine dependence, cigarettes, uncomplicated: Secondary | ICD-10-CM

## 2017-08-02 HISTORY — PX: AMPUTATION: SHX166

## 2017-08-02 LAB — CBC
HCT: 38.8 % — ABNORMAL LOW (ref 39.0–52.0)
Hemoglobin: 12.5 g/dL — ABNORMAL LOW (ref 13.0–17.0)
MCH: 26.2 pg (ref 26.0–34.0)
MCHC: 32.2 g/dL (ref 30.0–36.0)
MCV: 81.3 fL (ref 78.0–100.0)
PLATELETS: 315 10*3/uL (ref 150–400)
RBC: 4.77 MIL/uL (ref 4.22–5.81)
RDW: 13 % (ref 11.5–15.5)
WBC: 12 10*3/uL — ABNORMAL HIGH (ref 4.0–10.5)

## 2017-08-02 LAB — BASIC METABOLIC PANEL
Anion gap: 8 (ref 5–15)
BUN: 5 mg/dL — ABNORMAL LOW (ref 6–20)
CALCIUM: 9 mg/dL (ref 8.9–10.3)
CO2: 28 mmol/L (ref 22–32)
CREATININE: 0.97 mg/dL (ref 0.61–1.24)
Chloride: 99 mmol/L (ref 98–111)
GFR calc Af Amer: >60 mL/min (ref >60)
GLUCOSE: 111 mg/dL — AB (ref 70–99)
Potassium: 3.8 mmol/L (ref 3.5–5.1)
SODIUM: 135 mmol/L (ref 135–145)

## 2017-08-02 LAB — VANCOMYCIN, TROUGH: VANCOMYCIN TR: 8 ug/mL — AB (ref 15–20)

## 2017-08-02 SURGERY — AMPUTATION BELOW KNEE
Anesthesia: Regional | Site: Leg Lower | Laterality: Left

## 2017-08-02 MED ORDER — METHOCARBAMOL 1000 MG/10ML IJ SOLN
500.0000 mg | Freq: Four times a day (QID) | INTRAVENOUS | Status: DC | PRN
  Filled 2017-08-02: qty 5

## 2017-08-02 MED ORDER — MAGNESIUM CITRATE PO SOLN: 1.0000 | Freq: Once | ORAL | Status: DC | PRN

## 2017-08-02 MED ORDER — HYDROMORPHONE HCL 1 MG/ML IJ SOLN
INTRAMUSCULAR | Status: AC
  Filled 2017-08-02: qty 0.5

## 2017-08-02 MED ORDER — ESMOLOL HCL 100 MG/10ML IV SOLN
INTRAVENOUS | Status: DC | PRN
  Administered 2017-08-02: 40 mg via INTRAVENOUS

## 2017-08-02 MED ORDER — MIDAZOLAM HCL 2 MG/2ML IJ SOLN
INTRAMUSCULAR | Status: AC
  Filled 2017-08-02: qty 2

## 2017-08-02 MED ORDER — METOCLOPRAMIDE HCL 5 MG/ML IJ SOLN: 5.0000 mg | Freq: Three times a day (TID) | INTRAMUSCULAR | Status: DC | PRN

## 2017-08-02 MED ORDER — HYDROMORPHONE HCL 1 MG/ML IJ SOLN
0.2500 mg | INTRAMUSCULAR | Status: DC | PRN
  Administered 2017-08-02: 0.5 mg via INTRAVENOUS

## 2017-08-02 MED ORDER — OXYCODONE HCL 5 MG PO TABS
5.0000 mg | ORAL_TABLET | ORAL | Status: DC | PRN
  Administered 2017-08-02: 5 mg via ORAL
  Filled 2017-08-02: qty 1

## 2017-08-02 MED ORDER — ONDANSETRON HCL 4 MG/2ML IJ SOLN
INTRAMUSCULAR | Status: AC
  Filled 2017-08-02: qty 2

## 2017-08-02 MED ORDER — SODIUM CHLORIDE 0.9 % IJ SOLN
INTRAMUSCULAR | Status: AC
  Filled 2017-08-02: qty 20

## 2017-08-02 MED ORDER — FENTANYL CITRATE (PF) 250 MCG/5ML IJ SOLN
INTRAMUSCULAR | Status: AC
  Filled 2017-08-02: qty 5

## 2017-08-02 MED ORDER — POVIDONE-IODINE 10 % EX SWAB: 2.0000 "application " | Freq: Once | CUTANEOUS | Status: DC

## 2017-08-02 MED ORDER — CEFAZOLIN SODIUM 1 G IJ SOLR
INTRAMUSCULAR | Status: AC
  Filled 2017-08-02: qty 20

## 2017-08-02 MED ORDER — ACETAMINOPHEN 325 MG PO TABS: 325.0000 mg | ORAL_TABLET | Freq: Four times a day (QID) | ORAL | Status: DC | PRN

## 2017-08-02 MED ORDER — METHOCARBAMOL 500 MG PO TABS
500.0000 mg | ORAL_TABLET | Freq: Four times a day (QID) | ORAL | Status: DC | PRN
  Administered 2017-08-02 – 2017-08-04 (×5): 500 mg via ORAL
  Filled 2017-08-02 (×5): qty 1

## 2017-08-02 MED ORDER — FENTANYL CITRATE (PF) 250 MCG/5ML IJ SOLN
INTRAMUSCULAR | Status: DC | PRN
  Administered 2017-08-02: 50 ug via INTRAVENOUS
  Administered 2017-08-02 (×2): 75 ug via INTRAVENOUS
  Administered 2017-08-02: 50 ug via INTRAVENOUS

## 2017-08-02 MED ORDER — OXYCODONE HCL 5 MG PO TABS
10.0000 mg | ORAL_TABLET | ORAL | Status: DC | PRN
  Administered 2017-08-02 – 2017-08-03 (×5): 15 mg via ORAL
  Administered 2017-08-03: 10 mg via ORAL
  Administered 2017-08-04 (×2): 15 mg via ORAL
  Filled 2017-08-02 (×3): qty 3
  Filled 2017-08-02: qty 2
  Filled 2017-08-02 (×4): qty 3

## 2017-08-02 MED ORDER — HYDROMORPHONE HCL 1 MG/ML IJ SOLN
INTRAMUSCULAR | Status: AC
  Filled 2017-08-02: qty 1

## 2017-08-02 MED ORDER — HYDROMORPHONE HCL 1 MG/ML IJ SOLN
0.5000 mg | INTRAMUSCULAR | Status: DC | PRN
  Administered 2017-08-02: 0.5 mg via INTRAVENOUS
  Administered 2017-08-03 – 2017-08-04 (×9): 1 mg via INTRAVENOUS
  Filled 2017-08-02 (×10): qty 1

## 2017-08-02 MED ORDER — 0.9 % SODIUM CHLORIDE (POUR BTL) OPTIME
TOPICAL | Status: DC | PRN
  Administered 2017-08-02: 1000 mL

## 2017-08-02 MED ORDER — OXYCODONE HCL 5 MG PO TABS: 5.0000 mg | ORAL_TABLET | Freq: Once | ORAL | Status: DC | PRN

## 2017-08-02 MED ORDER — DEXAMETHASONE SODIUM PHOSPHATE 10 MG/ML IJ SOLN
INTRAMUSCULAR | Status: DC | PRN
  Administered 2017-08-02: 10 mg via INTRAVENOUS

## 2017-08-02 MED ORDER — DEXAMETHASONE SODIUM PHOSPHATE 10 MG/ML IJ SOLN
INTRAMUSCULAR | Status: AC
  Filled 2017-08-02: qty 1

## 2017-08-02 MED ORDER — METOCLOPRAMIDE HCL 5 MG PO TABS: 5.0000 mg | ORAL_TABLET | Freq: Three times a day (TID) | ORAL | Status: DC | PRN

## 2017-08-02 MED ORDER — ONDANSETRON HCL 4 MG/2ML IJ SOLN: 4.0000 mg | Freq: Four times a day (QID) | INTRAMUSCULAR | Status: DC | PRN

## 2017-08-02 MED ORDER — ESMOLOL HCL 100 MG/10ML IV SOLN
INTRAVENOUS | Status: AC
  Filled 2017-08-02: qty 10

## 2017-08-02 MED ORDER — ONDANSETRON HCL 4 MG/2ML IJ SOLN
INTRAMUSCULAR | Status: DC | PRN
  Administered 2017-08-02: 4 mg via INTRAVENOUS

## 2017-08-02 MED ORDER — PROPOFOL 10 MG/ML IV BOLUS
INTRAVENOUS | Status: DC | PRN
  Administered 2017-08-02: 200 mg via INTRAVENOUS

## 2017-08-02 MED ORDER — MIDAZOLAM HCL 2 MG/2ML IJ SOLN
INTRAMUSCULAR | Status: DC | PRN
  Administered 2017-08-02: 2 mg via INTRAVENOUS

## 2017-08-02 MED ORDER — PROPOFOL 10 MG/ML IV BOLUS
INTRAVENOUS | Status: AC
  Filled 2017-08-02: qty 20

## 2017-08-02 MED ORDER — POLYETHYLENE GLYCOL 3350 17 G PO PACK: 17.0000 g | PACK | Freq: Every day | ORAL | Status: DC | PRN

## 2017-08-02 MED ORDER — HYDROMORPHONE HCL 1 MG/ML IJ SOLN
INTRAMUSCULAR | Status: DC | PRN
  Administered 2017-08-02: 0.5 mg via INTRAVENOUS
  Administered 2017-08-02: 1 mg via INTRAVENOUS
  Administered 2017-08-02 (×2): .25 mg via INTRAVENOUS

## 2017-08-02 MED ORDER — SODIUM CHLORIDE 0.9 % IV SOLN
INTRAVENOUS | Status: DC
  Administered 2017-08-02 – 2017-08-03 (×3): via INTRAVENOUS

## 2017-08-02 MED ORDER — DEXMEDETOMIDINE HCL IN NACL 200 MCG/50ML IV SOLN
INTRAVENOUS | Status: DC | PRN
  Administered 2017-08-02: 12 ug via INTRAVENOUS
  Administered 2017-08-02: 8 ug via INTRAVENOUS
  Administered 2017-08-02: 12 ug via INTRAVENOUS
  Administered 2017-08-02: 8 ug via INTRAVENOUS
  Administered 2017-08-02: 20 ug via INTRAVENOUS

## 2017-08-02 MED ORDER — CHLORHEXIDINE GLUCONATE 4 % EX LIQD
60.0000 mL | Freq: Once | CUTANEOUS | Status: DC
  Filled 2017-08-02: qty 60

## 2017-08-02 MED ORDER — VANCOMYCIN HCL 10 G IV SOLR
1250.0000 mg | Freq: Three times a day (TID) | INTRAVENOUS | Status: DC
  Administered 2017-08-02 – 2017-08-04 (×6): 1250 mg via INTRAVENOUS
  Filled 2017-08-02 (×8): qty 1250

## 2017-08-02 MED ORDER — BISACODYL 10 MG RE SUPP: 10.0000 mg | Freq: Every day | RECTAL | Status: DC | PRN

## 2017-08-02 MED ORDER — OXYCODONE HCL 5 MG/5ML PO SOLN: 5.0000 mg | Freq: Once | ORAL | Status: DC | PRN

## 2017-08-02 MED ORDER — DOCUSATE SODIUM 100 MG PO CAPS: 100.0000 mg | ORAL_CAPSULE | Freq: Two times a day (BID) | ORAL | Status: DC

## 2017-08-02 MED ORDER — LIDOCAINE 2% (20 MG/ML) 5 ML SYRINGE
INTRAMUSCULAR | Status: DC | PRN
  Administered 2017-08-02: 100 mg via INTRAVENOUS

## 2017-08-02 MED ORDER — GABAPENTIN 300 MG PO CAPS
300.0000 mg | ORAL_CAPSULE | Freq: Three times a day (TID) | ORAL | Status: DC
  Administered 2017-08-02 – 2017-08-04 (×6): 300 mg via ORAL
  Filled 2017-08-02 (×6): qty 1

## 2017-08-02 MED ORDER — LIDOCAINE 2% (20 MG/ML) 5 ML SYRINGE
INTRAMUSCULAR | Status: AC
  Filled 2017-08-02: qty 5

## 2017-08-02 MED ORDER — LACTATED RINGERS IV SOLN
INTRAVENOUS | Status: DC | PRN
  Administered 2017-08-02 (×2): via INTRAVENOUS

## 2017-08-02 MED ORDER — CEFAZOLIN SODIUM-DEXTROSE 2-3 GM-%(50ML) IV SOLR
INTRAVENOUS | Status: DC | PRN
  Administered 2017-08-02: 2 g via INTRAVENOUS

## 2017-08-02 MED ORDER — CEFAZOLIN SODIUM-DEXTROSE 2-4 GM/100ML-% IV SOLN
2.0000 g | INTRAVENOUS | Status: AC
  Filled 2017-08-02: qty 100

## 2017-08-02 MED ORDER — ONDANSETRON HCL 4 MG PO TABS: 4.0000 mg | ORAL_TABLET | Freq: Four times a day (QID) | ORAL | Status: DC | PRN

## 2017-08-02 SURGICAL SUPPLY — 37 items
BENZOIN TINCTURE PRP APPL 2/3 (GAUZE/BANDAGES/DRESSINGS) ×5 IMPLANT
BLADE SAW RECIP 87.9 MT (BLADE) ×2 IMPLANT
BLADE SURG 21 STRL SS (BLADE) ×2 IMPLANT
BNDG COHESIVE 6X5 TAN STRL LF (GAUZE/BANDAGES/DRESSINGS) ×4 IMPLANT
BNDG GAUZE ELAST 4 BULKY (GAUZE/BANDAGES/DRESSINGS) ×4 IMPLANT
CANISTER WOUNDNEG PRESSURE 500 (CANNISTER) ×1 IMPLANT
COVER SURGICAL LIGHT HANDLE (MISCELLANEOUS) ×2 IMPLANT
CUFF TOURNIQUET SINGLE 34IN LL (TOURNIQUET CUFF) IMPLANT
CUFF TOURNIQUET SINGLE 44IN (TOURNIQUET CUFF) IMPLANT
DRAPE INCISE IOBAN 66X45 STRL (DRAPES) ×1 IMPLANT
DRAPE U-SHAPE 47X51 STRL (DRAPES) ×2 IMPLANT
DRESSING PREVENA PLUS CUSTOM (GAUZE/BANDAGES/DRESSINGS) ×1 IMPLANT
DRSG PREVENA PLUS CUSTOM (GAUZE/BANDAGES/DRESSINGS) ×2
DURAPREP 26ML APPLICATOR (WOUND CARE) ×2 IMPLANT
ELECT REM PT RETURN 9FT ADLT (ELECTROSURGICAL) ×2
ELECTRODE REM PT RTRN 9FT ADLT (ELECTROSURGICAL) ×1 IMPLANT
GLOVE BIOGEL PI IND STRL 9 (GLOVE) ×1 IMPLANT
GLOVE BIOGEL PI INDICATOR 9 (GLOVE) ×1
GLOVE SURG ORTHO 9.0 STRL STRW (GLOVE) ×2 IMPLANT
GOWN STRL REUS W/ TWL XL LVL3 (GOWN DISPOSABLE) ×2 IMPLANT
GOWN STRL REUS W/TWL XL LVL3 (GOWN DISPOSABLE) ×2
KIT BASIN OR (CUSTOM PROCEDURE TRAY) ×2 IMPLANT
KIT DRSG PREVENA PLUS 7DAY 125 (MISCELLANEOUS) ×1 IMPLANT
KIT TURNOVER KIT B (KITS) ×2 IMPLANT
MANIFOLD NEPTUNE II (INSTRUMENTS) ×2 IMPLANT
NS IRRIG 1000ML POUR BTL (IV SOLUTION) ×2 IMPLANT
PACK ORTHO EXTREMITY (CUSTOM PROCEDURE TRAY) ×2 IMPLANT
PAD ARMBOARD 7.5X6 YLW CONV (MISCELLANEOUS) ×2 IMPLANT
SPONGE LAP 18X18 X RAY DECT (DISPOSABLE) ×1 IMPLANT
STAPLER VISISTAT 35W (STAPLE) IMPLANT
STOCKINETTE IMPERVIOUS LG (DRAPES) ×2 IMPLANT
SUT SILK 2 0 (SUTURE) ×1
SUT SILK 2-0 18XBRD TIE 12 (SUTURE) ×1 IMPLANT
SUT VIC AB 1 CTX 27 (SUTURE) ×2 IMPLANT
TOWEL OR 17X26 10 PK STRL BLUE (TOWEL DISPOSABLE) ×2 IMPLANT
TUBE CONNECTING 12X1/4 (SUCTIONS) ×2 IMPLANT
YANKAUER SUCT BULB TIP NO VENT (SUCTIONS) ×2 IMPLANT

## 2017-08-02 NOTE — Progress Notes (Signed)
ANTIBIOTIC CONSULT NOTE  Pharmacy Consult for Vancomycin Indication: osteo  Allergies  Allergen Reactions  . Ciprofloxacin Other (See Comments)    Caused a seizure    Patient Measurements: Height: 5\' 9"  (175.3 cm) Weight: 229 lb 15 oz (104.3 kg) IBW/kg (Calculated) : 70.7 Adjusted Body Weight:   Vital Signs: Temp: 98.2 F (36.8 C) (06/30 1011) Temp Source: Oral (06/30 1011) BP: 121/66 (06/30 1011) Pulse Rate: 86 (06/30 1011) Intake/Output from previous day: 06/29 0701 - 06/30 0700 In: 960 [P.O.:960] Out: 1250 [Urine:1250] Intake/Output from this shift: Total I/O In: 1200 [I.V.:1200] Out: 175 [Urine:100; Blood:75]  Labs: Recent Labs    07/31/17 1104 08/01/17 0850 08/02/17 0522  WBC 13.0* 11.2* 12.0*  HGB 12.6* 11.9* 12.5*  PLT 250 277 315  CREATININE 0.88 0.89 0.97   Estimated Creatinine Clearance: 131.3 mL/min (by C-G formula based on SCr of 0.97 mg/dL). Recent Labs    08/02/17 1014  VANCOTROUGH 8*     Microbiology: Recent Results (from the past 720 hour(s))  Wound or Superficial Culture     Status: None   Collection Time: 07/30/17 12:30 AM  Result Value Ref Range Status   Specimen Description LEG AEROBIC SWAB ONLY SENT  Final   Special Requests NONE  Final   Gram Stain   Final    RARE WBC PRESENT, PREDOMINANTLY PMN RARE GRAM POSITIVE COCCI Performed at [facility], [address] [place], [place] [address]    Culture FEW METHICILLIN RESISTANT STAPHYLOCOCCUS AUREUS  Final   Report Status 08/01/2017 FINAL  Final   Organism ID, Bacteria METHICILLIN RESISTANT STAPHYLOCOCCUS AUREUS  Final      Susceptibility   Methicillin resistant staphylococcus aureus - MIC*    CIPROFLOXACIN >=8 RESISTANT Resistant     ERYTHROMYCIN >=8 RESISTANT Resistant     GENTAMICIN <=0.5 SENSITIVE Sensitive     OXACILLIN >=4 RESISTANT Resistant     TETRACYCLINE <=1 SENSITIVE Sensitive     VANCOMYCIN <=0.5 SENSITIVE Sensitive     TRIMETH/SULFA <=10 SENSITIVE Sensitive      CLINDAMYCIN <=0.25 SENSITIVE Sensitive     RIFAMPIN <=0.5 SENSITIVE Sensitive     Inducible Clindamycin NEGATIVE Sensitive     * FEW METHICILLIN RESISTANT STAPHYLOCOCCUS AUREUS  Blood culture (routine x 2)     Status: None (Preliminary result)   Collection Time: 07/30/17 12:33 AM  Result Value Ref Range Status   Specimen Description BLOOD RIGHT ARM  Final   Special Requests   Final    BOTTLES DRAWN AEROBIC AND ANAEROBIC Blood Culture results may not be optimal due to an excessive volume of blood received in culture bottles   Culture   Final    NO GROWTH 2 DAYS Performed at [facility], [address] [place], [place] [address]    Report Status PENDING  Incomplete  Blood culture (routine x 2)     Status: None (Preliminary result)   Collection Time: 07/30/17 12:45 AM  Result Value Ref Range Status   Specimen Description BLOOD LEFT ARM  Final   Special Requests   Final    BOTTLES DRAWN AEROBIC AND ANAEROBIC Blood Culture results may not be optimal due to an excessive volume of blood received in culture bottles   Culture   Final    NO GROWTH 2 DAYS Performed at [facility], [address] [place], [place] [address]    Report Status PENDING  Incomplete  Surgical pcr screen     Status: None  Collection Time: 07/30/17  8:29 PM  Result Value Ref Range Status   MRSA, PCR NEGATIVE NEGATIVE Final   Staphylococcus aureus NEGATIVE NEGATIVE Final    Comment: (NOTE) The Xpert SA Assay (FDA approved for NASAL specimens in patients 53 years of age and older), is one component of a comprehensive surveillance program. It is not intended to diagnose infection nor to guide or monitor treatment. Performed at [facility], [address] [place], [place] [address]     Medical History: Past Medical History:  Diagnosis Date  . 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 Displaced pilon fracture of left tibia, subsequent encounter for open fracture type IIIA, IIIB,  or IIIC with malunion 03/06/2016  . HTN (hypertension) 12/05/2015   pt denies this  . Medical history non-contributory   . Nicotine dependence 10/12/2015  . 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  . Type III open fracture of left tibial plafond with involvement of fibula 10/12/2015   Assessment: ID: MRSA LLE cellulitis. New abscess on top of the non-union fracture . MRI confirms osteomyelitis of L tibia, intramedullary abscess of mid to distal tibial diaphysis with sinus tract and cellulitis Tmax 99.1. WBC 12 up. Scr 0.97, d/c Rocephin? 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

## 2017-08-02 NOTE — Anesthesia Preprocedure Evaluation (Signed)
Anesthesia Evaluation  Patient identified by MRN, date of birth, ID band Patient awake    History of Anesthesia Complications Negative for: history of anesthetic complications  Airway Mallampati: II  TM Distance: >3 FB Neck ROM: Full    Dental  (+) Teeth Intact   Pulmonary Current Smoker,    breath sounds clear to auscultation       Cardiovascular hypertension,  Rhythm:Regular     Neuro/Psych negative neurological ROS     GI/Hepatic   Endo/Other  negative endocrine ROS  Renal/GU negative Renal ROS     Musculoskeletal Osteo tibia, non union ankle s/p trauma and repair   Abdominal   Peds  Hematology  (+) anemia ,   Anesthesia Other Findings   Reproductive/Obstetrics                             Anesthesia Physical Anesthesia Plan  ASA: II  Anesthesia Plan: General   Post-op Pain Management:    Induction: Intravenous  PONV Risk Score and Plan: 1 and Ondansetron and Dexamethasone  Airway Management Planned: LMA  Additional Equipment: None  Intra-op Plan:   Post-operative Plan: Extubation in OR  Informed Consent: I have reviewed the patients History and Physical, chart, labs and discussed the procedure including the risks, benefits and alternatives for the proposed anesthesia with the patient or authorized representative who has indicated his/her understanding and acceptance.   Dental advisory given  Plan Discussed with: CRNA and Surgeon  Anesthesia Plan Comments: (Patient refused block preop. Discussed risks and benefits. Patient may request post op if pain is unmanageable )        Anesthesia Quick Evaluation

## 2017-08-02 NOTE — Transfer of Care (Signed)
Immediate Anesthesia Transfer of Care Note  Patient: Damon Alexander  Procedure(s) Performed: AMPUTATION BELOW KNEE (Left Leg Lower)  Patient Location: PACU  Anesthesia Type:General  Level of Consciousness: awake, alert  and oriented  Airway & Oxygen Therapy: Patient Spontanous Breathing and Patient connected to face mask oxygen  Post-op Assessment: Report given to RN and Post -op Vital signs reviewed and stable  Post vital signs: Reviewed and stable  Last Vitals:  Vitals Value Taken Time  BP 150/97 08/02/2017  8:48 AM  Temp    Pulse 100 08/02/2017  8:50 AM  Resp 14 08/02/2017  8:50 AM  SpO2 96 % 08/02/2017  8:50 AM  Vitals shown include unvalidated device data.  Last Pain:  Vitals:   08/02/17 0551  TempSrc: Oral  PainSc:       Patients Stated Pain Goal: 0 (08/01/17 1858)  Complications: No apparent anesthesia complications

## 2017-08-02 NOTE — Progress Notes (Signed)
Orthopedic Tech Progress Note Patient Details:  Emilia BeckSamuel D Asman 1985/05/15 [id number]  Patient ID: Matthew FolksSamuel D Brandes, male   DOB: 1985/05/15, 32 y.o.   MRN: [id number]   Saul FordyceJennifer C Avish Torry 08/02/2017, 3:09 PMCalled [employer] for left BKA stump shrinker.

## 2017-08-02 NOTE — Interval H&P Note (Signed)
History and Physical Interval Note:  08/02/2017 7:43 AM  Damon FolksSamuel D Grandberry  has presented today for surgery, with the diagnosis of osteomyeltis tibia, non-union ankle  The various methods of treatment have been discussed with the patient and family. After consideration of risks, benefits and other options for treatment, the patient has consented to  Procedure(s): AMPUTATION BELOW KNEE (Left) as a surgical intervention .  The patient's history has been reviewed, patient examined, no change in status, stable for surgery.  I have reviewed the patient's chart and labs.  Questions were answered to the patient's satisfaction.     Nadara MustardMarcus V Jady Braggs

## 2017-08-02 NOTE — Anesthesia Procedure Notes (Signed)
Procedure Name: LMA Insertion Date/Time: 08/02/2017 7:50 AM Performed by: Elliot DallyHuggins, Clare Fennimore, CRNA Pre-anesthesia Checklist: Patient identified, Emergency Drugs available, Suction available and Patient being monitored Patient Re-evaluated:Patient Re-evaluated prior to induction Oxygen Delivery Method: Circle System Utilized Preoxygenation: Pre-oxygenation with 100% oxygen Induction Type: IV induction Ventilation: Mask ventilation without difficulty LMA: LMA inserted LMA Size: 4.0 Number of attempts: 1 Airway Equipment and Method: Bite block Placement Confirmation: positive ETCO2 Tube secured with: Tape Dental Injury: Teeth and Oropharynx as per pre-operative assessment

## 2017-08-02 NOTE — Progress Notes (Signed)
PROGRESS NOTE   Damon FolksSamuel D Alexander  ONG:[id number]    DOB: 08-Feb-1985    DOA: 07/30/2017  PCP: Patient, No Pcp Per   I have briefly reviewed patients previous medical records in [facility].  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  Admitted for left leg cellulitis, possible abscess on top of nonunion fracture.  MRI confirms osteomyelitis, intramedullary abscess of mid to distal tibial diaphysis with sinus tract and cellulitis.  ID and orthopedics consulting.  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  Continue empirically started IV Zosyn.  Lower extremity venous Doppler negative for DVT.  X-rays do not show osteomyelitis.  CT of left ankle shows loosening of hardware.  MRI of left leg confirms osteomyelitis, intramedullary abscess of the mid to distal tibial diaphysis extending to the proximal ankle hardware, sinus tract, cellulitis.  ID added ceftriaxone 6/28.  ID recommends 6 to 8 weeks of IV antibiotics after surgical debridement.  Likely PICC line placement 7/1.  Blood cultures x2: Negative to date.  Superficial wound culture shows few MRSA and hence ceftriaxone discontinued.  Orthopedics/Dr. Lajoyce Cornersuda consulted and patient underwent left BKA with wound VAC placement on 6/30.  Tobacco abuse: Cessation counseled.  Continue  nicotine patch.  Alcohol abuse Continue CIWA protocol.  No overt withdrawal at this time.  Possible hypertension:  Patient denies.  Continue PRN IV hydralazine.  Pain control.  Improved control.  Stable.  Hypokalemia Replaced.  Magnesium 1.8.  Anemia: Likely due to acute illness.  Stable.  Follow postop CBCs in a.m.   DVT prophylaxis: Lovenox Code Status: Full Family Communication: Discussed in detail with patient's mother at bedside.  Updated care and answered questions. Disposition: To be determined pending clinical improvement and clearance by orthopedics.   Consultants:  Infectious disease Orthopedics.  Procedures:  Left BKA 6/30  Antimicrobials:  IV vancomycin IV ceftriaxone 6/28 > 6/29   Subjective: Seen immediately postop after he came back to the floor.  Slightly groggy but answers questions appropriately.  Not much pain reported.  Denies any other complaints.  As per nursing, no acute issues noted.  ROS: As above.  Objective:  Vitals:   08/02/17 0915 08/02/17 0930 08/02/17 0945 08/02/17 1011  BP: (!) 161/87 (!) 151/93 118/71 121/66  Pulse: (!) 105 95 95 86  Resp: 14 17 16    Temp:   98.1 F (36.7 C) 98.2 F (36.8 C)  TempSrc:    Oral  SpO2: 99% 100% 98% 94%  Weight:      Height:        Examination:  General exam: Pleasant young male, well-built and nourished, lying comfortably supine in bed. Respiratory system: Clear to auscultation. Respiratory effort normal.  Stable Cardiovascular system: S1 & S2 heard, RRR. No JVD, murmurs, rubs, gallops or clicks. No pedal edema.  Stable Gastrointestinal system: Abdomen is nondistended, soft and nontender. No  organomegaly or masses felt. Normal bowel sounds heard.  Stable Central nervous system: Alert and oriented. No focal neurological deficits.  Stable Extremities: Symmetric 5 x 5 power.  Left BKA with wound VAC. Skin: No rashes, lesions or ulcers Psychiatry: Judgement and insight appear normal. Mood &  affect appropriate.     Data Reviewed: I have personally reviewed following labs and imaging studies  CBC: Recent Labs  Lab 07/30/17 0045 07/30/17 0433 07/31/17 1104 08/01/17 0850 08/02/17 0522  WBC 12.8* 12.9* 13.0* 11.2* 12.0*  NEUTROABS 8.1*  --   --   --   --   HGB 13.3 12.2* 12.6* 11.9* 12.5*  HCT 41.7 37.9* 39.3 37.1* 38.8*  MCV 83.4 82.8 83.8 82.6 81.3  PLT 300 266 250 277 315   Basic Metabolic Panel: Recent Labs  Lab 07/30/17 0045 07/30/17 0433 07/31/17 1104 08/01/17 0850 08/02/17 0522  NA 139 136 136 133* 135  K 3.3* 3.2* 3.5 3.3* 3.8  CL 102 103 101 95* 99  CO2 27 27 25 28 28   GLUCOSE 98 130* 108* 143* 111*  BUN 8 9 <5* <5* 5*  CREATININE 1.04 0.96 0.88 0.89 0.97  CALCIUM 9.1 8.3* 9.0 8.6* 9.0  MG  --  1.8  --   --   --     Recent Results (from the past 240 hour(s))  Wound or Superficial Culture     Status: None   Collection Time: 07/30/17 12:30 AM  Result Value Ref Range Status   Specimen Description LEG AEROBIC SWAB ONLY SENT  Final   Special Requests NONE  Final   Gram Stain   Final    RARE WBC PRESENT, PREDOMINANTLY PMN RARE GRAM POSITIVE COCCI Performed at [facility], [address] [place], [place] [address]    Culture FEW METHICILLIN RESISTANT STAPHYLOCOCCUS AUREUS  Final   Report Status 08/01/2017 FINAL  Final   Organism ID, Bacteria METHICILLIN RESISTANT STAPHYLOCOCCUS AUREUS  Final      Susceptibility   Methicillin resistant staphylococcus aureus - MIC*    CIPROFLOXACIN >=8 RESISTANT Resistant     ERYTHROMYCIN >=8 RESISTANT Resistant     GENTAMICIN <=0.5 SENSITIVE Sensitive     OXACILLIN >=4 RESISTANT Resistant     TETRACYCLINE <=1 SENSITIVE Sensitive     VANCOMYCIN <=0.5 SENSITIVE Sensitive     TRIMETH/SULFA <=10 SENSITIVE Sensitive     CLINDAMYCIN <=0.25 SENSITIVE Sensitive     RIFAMPIN <=0.5 SENSITIVE Sensitive     Inducible Clindamycin NEGATIVE Sensitive     * FEW METHICILLIN RESISTANT STAPHYLOCOCCUS AUREUS  Blood  culture (routine x 2)     Status: None (Preliminary result)   Collection Time: 07/30/17 12:33 AM  Result Value Ref Range Status   Specimen Description BLOOD RIGHT ARM  Final   Special Requests   Final    BOTTLES DRAWN AEROBIC AND ANAEROBIC Blood Culture results may not be optimal due to an excessive volume of blood received in culture bottles   Culture   Final    NO GROWTH 2 DAYS Performed at [facility], [address] [place], [place] [address]    Report Status PENDING  Incomplete  Blood culture (routine x 2)     Status: None (Preliminary result)   Collection Time: 07/30/17 12:45 AM  Result Value Ref Range Status   Specimen Description BLOOD LEFT ARM  Final   Special Requests   Final    BOTTLES DRAWN AEROBIC AND ANAEROBIC Blood Culture results may not be  optimal due to an excessive volume of blood received in culture bottles   Culture   Final    NO GROWTH 2 DAYS Performed at [facility], [address], [place], [place] [address]    Report Status PENDING  Incomplete  Surgical pcr screen     Status: None   Collection Time: 07/30/17  8:29 PM  Result Value Ref Range Status   MRSA, PCR NEGATIVE NEGATIVE Final   Staphylococcus aureus NEGATIVE NEGATIVE Final    Comment: (NOTE) The Xpert SA Assay (FDA approved for NASAL specimens in patients 73 years of age and older), is one component of a comprehensive surveillance program. It is not intended to diagnose infection nor to guide or monitor treatment. Performed at [facility], [address] [place], [place] [address]          Radiology Studies: No results found.      Scheduled Meds: . chlorhexidine  60 mL Topical Once  . docusate sodium  100 mg Oral BID  . enoxaparin (LOVENOX) injection  40 mg Subcutaneous Q24H  . folic acid  1 mg Oral Daily  . gabapentin  300 mg Oral TID  . HYDROmorphone      . LORazepam  0-4 mg Intravenous Q12H  . multivitamin with minerals  1 tablet Oral Daily  . nicotine   21 mg Transdermal Daily  . povidone-iodine  2 application Topical Once  . thiamine  100 mg Oral Daily   Or  . thiamine  100 mg Intravenous Daily   Continuous Infusions: . sodium chloride    .  ceFAZolin (ANCEF) IV    . methocarbamol (ROBAXIN)  IV    . vancomycin       LOS: 3 days     Marcellus Scott, MD, FACP, [facility]. [facility] Pager [phone]  If 7PM-7AM, please contact night-coverage [email or web address] Password [facility] 08/02/2017, 12:04 PM

## 2017-08-02 NOTE — Op Note (Signed)
   Date of Surgery: 08/02/2017  INDICATIONS: Mr. Damon Alexander is a 32 y.o.-year-old male who presents with 2-year history of draining abscess with chronic osteomyelitis of the left tibia and a nonunion of an ankle fusion.  Patient has failed prolonged surgical intervention he is unable to weight-bear complains of chronic pain he is unable to perform activities of daily living.Marland Kitchen.  PREOPERATIVE DIAGNOSIS: Chronic osteomyelitis left tibia with abscess cloaca sinus draining tract and nonunion of ankle fusion  POSTOPERATIVE DIAGNOSIS: Same.  PROCEDURE: Transtibial amputation Application of Prevena wound VAC  SURGEON: Lajoyce Cornersuda, M.D.  ANESTHESIA:  general  IV FLUIDS AND URINE: See anesthesia.  ESTIMATED BLOOD LOSS: Minimal mL.  COMPLICATIONS: None.  DESCRIPTION OF PROCEDURE: The patient was brought to the operating room and underwent a general anesthetic. After adequate levels of anesthesia were obtained patient's lower extremity was prepped using DuraPrep draped into a sterile field. A timeout was called. The foot was draped out of the sterile field with impervious stockinette. A transverse incision was made 11 cm distal to the tibial tubercle. This curved proximally and a large posterior flap was created. The tibia was transected 1 cm proximal to the skin incision. The fibula was transected just proximal to the tibial incision. The tibia was beveled anteriorly. A large posterior flap was created. The sciatic nerve was pulled cut and allowed to retract. The vascular bundles were suture ligated with 2-0 silk. The deep and superficial fascial layers were closed using #1 Vicryl. The skin was closed using staples and 2-0 nylon. The wound was covered with a Prevena wound VAC. There was a good suction fit. A prosthetic shrinker was applied. Patient was extubated taken to the PACU in stable condition.   DISCHARGE PLANNING:  Antibiotic duration: Continue IV antibiotics for 24 hours  postoperatively  Weightbearing: Nonweightbearing on the left  Pain medication: High-dose narcotic protocol ordered  Dressing care/ Wound VAC: Continue wound VAC with discharge with the portable Praveena wound VAC pump  Discharge to: Home.  Anticipate discharge to home on Wednesday.  Follow-up: In the office 1 week post operative.  Damon BakerMarcus Tiena Manansala, MD [facility] 8:36 AM

## 2017-08-03 ENCOUNTER — Encounter (HOSPITAL_COMMUNITY): Payer: Self-pay | Admitting: Orthopedic Surgery

## 2017-08-03 DIAGNOSIS — M869 Osteomyelitis, unspecified: Secondary | ICD-10-CM | POA: Insufficient documentation

## 2017-08-03 DIAGNOSIS — L02416 Cutaneous abscess of left lower limb: Secondary | ICD-10-CM

## 2017-08-03 DIAGNOSIS — R7881 Bacteremia: Secondary | ICD-10-CM

## 2017-08-03 DIAGNOSIS — Z888 Allergy status to other drugs, medicaments and biological substances status: Secondary | ICD-10-CM

## 2017-08-03 DIAGNOSIS — B9562 Methicillin resistant Staphylococcus aureus infection as the cause of diseases classified elsewhere: Secondary | ICD-10-CM

## 2017-08-03 LAB — BASIC METABOLIC PANEL
ANION GAP: 9 (ref 5–15)
BUN: 8 mg/dL (ref 6–20)
CHLORIDE: 103 mmol/L (ref 98–111)
CO2: 25 mmol/L (ref 22–32)
Calcium: 9.6 mg/dL (ref 8.9–10.3)
Creatinine, Ser: 0.82 mg/dL (ref 0.61–1.24)
GFR calc Af Amer: >60 mL/min (ref >60)
GFR calc non Af Amer: >60 mL/min (ref >60)
GLUCOSE: 252 mg/dL — AB (ref 70–99)
POTASSIUM: 4.1 mmol/L (ref 3.5–5.1)
Sodium: 137 mmol/L (ref 135–145)

## 2017-08-03 LAB — CBC
HCT: 38.2 % — ABNORMAL LOW (ref 39.0–52.0)
Hemoglobin: 12.2 g/dL — ABNORMAL LOW (ref 13.0–17.0)
MCH: 26.4 pg (ref 26.0–34.0)
MCHC: 31.9 g/dL (ref 30.0–36.0)
MCV: 82.7 fL (ref 78.0–100.0)
Platelets: 369 10*3/uL (ref 150–400)
RBC: 4.62 MIL/uL (ref 4.22–5.81)
RDW: 13.2 % (ref 11.5–15.5)
WBC: 15.5 10*3/uL — ABNORMAL HIGH (ref 4.0–10.5)

## 2017-08-03 MED ORDER — BUPIVACAINE-EPINEPHRINE (PF) 0.5% -1:200000 IJ SOLN
INTRAMUSCULAR | Status: DC | PRN
  Administered 2017-08-02: 10 mL
  Administered 2017-08-02: 20 mL via PERINEURAL

## 2017-08-03 MED ORDER — DOXYCYCLINE HYCLATE 100 MG PO CAPS: 100.0000 mg | ORAL_CAPSULE | Freq: Two times a day (BID) | ORAL | 0 refills | Status: AC

## 2017-08-03 MED ORDER — ROPIVACAINE HCL 7.5 MG/ML IJ SOLN
INTRAMUSCULAR | Status: DC | PRN
  Administered 2017-08-02: 5 mL via PERINEURAL
  Administered 2017-08-02: 10 mL via PERINEURAL

## 2017-08-03 NOTE — Progress Notes (Addendum)
[facility]  Date of Admission:  07/30/2017     Total days of antibiotics 6         ASSESSMENT/PLAN  Mr. Damon Alexander is a 32 y/o male with previous MRSA infection and non-union fracture with hardware of the left leg presenting with MRSA bacteremia, osteomyelitis and abscess. Now POD 1 from trans-tibial amputation of the left leg. He has been afebrile with elevated WBC count of 15. This is likely from surgery. He has now obtained source control with repeat blood cultures being negative. Plan is for discharge likely tomorrow.   1. Recommend continue vancomycin while in hospital and change to oral doxycyline for 10 days from surgery.  ID will sign off and will be available as needed during the duration of hospital stay.   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 chlorhexidine  60 mL Topical Once  . docusate sodium  100 mg Oral BID  . enoxaparin (LOVENOX) injection  40 mg Subcutaneous Q24H  . folic acid  1 mg Oral Daily  . gabapentin  300 mg Oral TID  . multivitamin with minerals  1 tablet Oral Daily  . nicotine  21 mg Transdermal Daily  . povidone-iodine  2 application Topical Once  . thiamine  100 mg Oral Daily   Or  . thiamine  100 mg Intravenous Daily    SUBJECTIVE:  Afebrile overnight. WBC count slightly elevated. POD 1 from trans-tibial amputation by Dr. Lajoyce Corners. Does have mild pain but otherwise doing well. Repeat blood cultures remain negative.   Allergies  Allergen Reactions  . Ciprofloxacin Other (See Comments)    Caused a seizure     Review of Systems: Review of Systems  Constitutional: Negative for chills and fever.  Respiratory: Negative for cough, shortness of breath and wheezing.   Cardiovascular: Negative for chest pain.    Gastrointestinal: Negative for abdominal pain, constipation, diarrhea, nausea and vomiting.  Musculoskeletal:       Positive for left lower extremity pain.   Skin: Negative for rash.      OBJECTIVE: Vitals:   08/02/17 1700 08/02/17 2043 08/03/17 0558 08/03/17 0838  BP: 132/76 (!) 145/80 128/79 127/74  Pulse: 82 80 73 71  Resp: 18 18 18 16   Temp: 98.6 F (37 C) 97.7 F (36.5 C) 97.6 F (36.4 C) 98 F (36.7 C)  TempSrc: Oral Oral Oral Oral  SpO2: 96% 97% 100% 98%  Weight:  229 lb 15.2 oz (104.3 kg)    Height:       Body mass index is 33.96 kg/m.  Physical Exam  Constitutional: He is oriented to person, place, and time. He appears well-developed and well-nourished. No distress.  Cardiovascular: Normal rate, regular rhythm, normal heart sounds and intact distal pulses. Exam reveals no gallop and no friction rub.  No murmur heard. Pulmonary/Chest: Effort normal and breath sounds normal. No stridor. No respiratory distress. He has no wheezes. He has no rales. He exhibits no tenderness.  Musculoskeletal:  Wound vac in place with sleeve.  Neurological: He is alert and oriented to person, place, and time.  Skin: Skin is warm and dry.  Psychiatric: He has a normal mood and affect. His behavior is normal.    Lab Results Lab Results  Component Value Date   WBC 15.5 (H) 08/03/2017   HGB 12.2 (L) 08/03/2017   HCT 38.2 (L) 08/03/2017   MCV 82.7 08/03/2017   PLT 369 08/03/2017    Lab Results  Component Value Date   CREATININE 0.82 08/03/2017   BUN 8 08/03/2017   NA 137 08/03/2017   K 4.1 08/03/2017   CL 103 08/03/2017   CO2 25 08/03/2017    Lab Results  Component Value Date   ALT 11 (L) 07/26/2016   AST 18 07/26/2016   ALKPHOS 101 07/26/2016   BILITOT 0.5 07/26/2016     Microbiology: Recent Results (from the past 240 hour(s))  Wound or Superficial Culture     Status: None   Collection Time: 07/30/17 12:30 AM  Result Value Ref Range Status   Specimen Description  LEG AEROBIC SWAB ONLY SENT  Final   Special Requests NONE  Final   Gram Stain   Final    RARE WBC PRESENT, PREDOMINANTLY PMN RARE GRAM POSITIVE COCCI Performed at [facility], [address], [place], [place] [address]    Culture FEW METHICILLIN RESISTANT STAPHYLOCOCCUS AUREUS  Final   Report Status 08/01/2017 FINAL  Final   Organism ID, Bacteria METHICILLIN RESISTANT STAPHYLOCOCCUS AUREUS  Final      Susceptibility   Methicillin resistant staphylococcus aureus - MIC*    CIPROFLOXACIN >=8 RESISTANT Resistant     ERYTHROMYCIN >=8 RESISTANT Resistant     GENTAMICIN <=0.5 SENSITIVE Sensitive     OXACILLIN >=4 RESISTANT Resistant     TETRACYCLINE <=1 SENSITIVE Sensitive     VANCOMYCIN <=0.5 SENSITIVE Sensitive     TRIMETH/SULFA <=10 SENSITIVE Sensitive     CLINDAMYCIN <=0.25 SENSITIVE Sensitive     RIFAMPIN <=0.5 SENSITIVE Sensitive     Inducible Clindamycin NEGATIVE Sensitive     * FEW METHICILLIN RESISTANT STAPHYLOCOCCUS AUREUS  Blood culture (routine x 2)     Status: None (Preliminary result)   Collection Time: 07/30/17 12:33 AM  Result Value Ref Range Status   Specimen Description BLOOD RIGHT ARM  Final   Special Requests   Final    BOTTLES DRAWN AEROBIC AND ANAEROBIC Blood Culture results may not be optimal due to an excessive volume of blood received in culture bottles   Culture   Final    NO GROWTH 3 DAYS Performed at [facility], [address], [place], [place] [address]    Report Status PENDING  Incomplete  Blood culture (routine x 2)     Status: None (Preliminary result)   Collection Time: 07/30/17 12:45 AM  Result Value Ref Range Status   Specimen Description BLOOD LEFT ARM  Final   Special Requests   Final    BOTTLES DRAWN AEROBIC AND ANAEROBIC Blood Culture results may not be optimal due to an excessive volume of blood received in culture bottles   Culture   Final    NO GROWTH 3 DAYS Performed at [facility], [address], [place],  [place] [address]    Report Status PENDING  Incomplete  Surgical pcr screen     Status: None   Collection Time: 07/30/17  8:29 PM  Result Value Ref Range Status   MRSA, PCR NEGATIVE NEGATIVE Final   Staphylococcus aureus NEGATIVE NEGATIVE Final    Comment: (NOTE) The Xpert SA Assay (FDA approved for NASAL specimens in patients 81 years of age and older), is one component of a comprehensive surveillance program. It is not intended to  diagnose infection nor to guide or monitor treatment. Performed at [facility], [address] [place], [place] [address]      Marcos EkeGreg Calone, NP [facility] [phone] Pager  08/03/2017  11:57 AM

## 2017-08-03 NOTE — Evaluation (Signed)
Physical Therapy Evaluation Patient Details Name: Damon BeckSamuel D Alexander MRN: [id number] DOB: [birth year] Today's Date: 08/03/2017   History of Present Illness  Admitted with painful non-union of L ankle fusion, including infection; now s/p L BKA; history of HTN, nicotine dependence   Clinical Impression   Patient is s/p above surgery resulting in functional limitations due to the deficits listed below (see PT Problem List). Overall moving quite well, and he is well-versed in LLE NWB gait; Will plan on crutches tomorrow as well as stair training; On track fo rdc home tomorrow or Wed;  Patient will benefit from skilled PT to increase their independence and safety with mobility to allow discharge to the venue listed below.       Follow Up Recommendations Outpatient PT    Equipment Recommendations  Crutches    Recommendations for Other Services       Precautions / Restrictions Precautions Required Braces or Orthoses: Other Brace/Splint Other Brace/Splint: Shrinker for L residual Limb Restrictions Weight Bearing Restrictions: No      Mobility  Bed Mobility Overal bed mobility: Independent                Transfers Overall transfer level: Needs assistance Equipment used: Rolling walker (2 wheeled) Transfers: Sit to/from Stand Sit to Stand: Supervision         General transfer comment: Tends to have both hands on RW; needs cues for hand placment  Ambulation/Gait Ambulation/Gait assistance: Min guard;Supervision Gait Distance (Feet): 200 Feet Assistive device: Rolling walker (2 wheeled) Gait Pattern/deviations: Step-through pattern     General Gait Details: Cues to self-monitor for activity tolerance  Stairs            Wheelchair Mobility    Modified Rankin (Stroke Patients Only)       Balance                                             Pertinent Vitals/Pain Pain Assessment: 0-10 Pain Score: 9  Pain Location: LLE; was premedicated  for pain, and pain had decreased by th eend of session Pain Descriptors / Indicators: Aching;Grimacing;Guarding Pain Intervention(s): Premedicated before session    Home Living Family/patient expects to be discharged to:: Private residence Living Arrangements: Spouse/significant other Available Help at Discharge: Family;Available PRN/intermittently Type of Home: House Home Access: Stairs to enter Entrance Stairs-Rails: None Entrance Stairs-Number of Steps: 2 Home Layout: One level Home Equipment: Crutches(reports his crutches are in need of replacement)      Prior Function Level of Independence: Independent with assistive device(s)         Comments: has used cruthces and managed NWB for 2 years     Hand Dominance        Extremity/Trunk Assessment   Upper Extremity Assessment Upper Extremity Assessment: Overall WFL for tasks assessed    Lower Extremity Assessment Lower Extremity Assessment: LLE deficits/detail LLE Deficits / Details: New BKA; good knee and hip AROM; taught hamstring stretch       Communication   Communication: No difficulties  Cognition Arousal/Alertness: Awake/alert Behavior During Therapy: WFL for tasks assessed/performed Overall Cognitive Status: Within Functional Limits for tasks assessed                                        General Comments  Exercises Other Exercises Other Exercises: LLE hamstring stretch with 30 sec hold x2 Other Exercises: Modified bridge (pillows bolstered under LLE) x10 Other Exercises: Quad set x10 LLE Other Exercises: Standing L hip extension x10    Assessment/Plan    PT Assessment Patient needs continued PT services  PT Problem List Decreased strength;Decreased range of motion;Decreased activity tolerance;Decreased balance;Decreased mobility;Decreased knowledge of precautions;Decreased skin integrity       PT Treatment Interventions DME instruction;Gait training;Stair  training;Functional mobility training;Therapeutic activities;Therapeutic exercise;Balance training;Patient/family education    PT Goals (Current goals can be found in the Care Plan section)  Acute Rehab PT Goals Patient Stated Goal: get his prosthesis and walk PT Goal Formulation: With patient Time For Goal Achievement: 08/10/17 Potential to Achieve Goals: Good    Frequency Min 3X/week   Barriers to discharge        Co-evaluation               AM-PAC PT "6 Clicks" Daily Activity  Outcome Measure Difficulty turning over in bed (including adjusting bedclothes, sheets and blankets)?: None Difficulty moving from lying on back to sitting on the side of the bed? : None Difficulty sitting down on and standing up from a chair with arms (e.g., wheelchair, bedside commode, etc,.)?: A Little Help needed moving to and from a bed to chair (including a wheelchair)?: None Help needed walking in hospital room?: A Little Help needed climbing 3-5 steps with a railing? : A Little 6 Click Score: 21    End of Session Equipment Utilized During Treatment: Gait belt Activity Tolerance: Patient tolerated treatment well Patient left: in chair;with call bell/phone within reach Nurse Communication: Mobility status PT Visit Diagnosis: Other abnormalities of gait and mobility (R26.89)    Time: 0825(Start time is approx)-0847 PT Time Calculation (min) (ACUTE ONLY): 22 min   Charges:   PT Evaluation $PT Eval Moderate Complexity: 1 Mod     PT G Codes:        Van Clines, PT  Acute Rehabilitation Services Pager [phone] Office [phone]   Levi Aland 08/03/2017, 11:35 AM

## 2017-08-03 NOTE — Progress Notes (Signed)
Patient ID: Damon BeckSamuel D Siddiqi, male   DOB: [birth year], 32 y.o.   MRN: [id number] Postoperative day 1 transtibial amputation on the left.  Patient has full extension he has minimal pain at this time there is no drainage in the wound VAC canister.  Plan for discharge to home when patient is stable with crutches most likely tomorrow Tuesday.  I will follow-up in the office in 1 week.  Discharge with the portable Praveena wound VAC pump.

## 2017-08-03 NOTE — Anesthesia Procedure Notes (Signed)
Anesthesia Regional Block: Popliteal block   Pre-Anesthetic Checklist: ,, timeout performed, Correct Patient, Correct Site, Correct Laterality, Correct Procedure, Correct Position, site marked, Risks and benefits discussed,  Surgical consent,  Pre-op evaluation,  At surgeon's request and post-op pain management  Laterality: Lower and Left  Prep: chloraprep       Needles:  Injection technique: Single-shot  Needle Type: Echogenic Stimulator Needle          Additional Needles:   Procedures:,,,, ultrasound used (permanent image in chart),,,,   Nerve Stimulator or Paresthesia:  Response: 0.5 mA,   Additional Responses:   Narrative:  Start time: 08/02/2017 9:39 AM End time: 08/02/2017 9:49 AM Injection made incrementally with aspirations every 5 mL.  Performed by: Personally  Anesthesiologist: Val EagleMoser, Jadian Karman, MD  Additional Notes: H+P and labs reviewed, risks and benefits discussed with patient, procedure tolerated well without complications

## 2017-08-03 NOTE — Anesthesia Postprocedure Evaluation (Signed)
Anesthesia Post Note  Patient: Damon Alexander  Procedure(s) Performed: AMPUTATION BELOW KNEE (Left Leg Lower)     Patient location during evaluation: PACU Anesthesia Type: Regional and General Level of consciousness: awake and alert Pain management: pain level controlled Vital Signs Assessment: post-procedure vital signs reviewed and stable Respiratory status: spontaneous breathing, nonlabored ventilation, respiratory function stable and patient connected to nasal cannula oxygen Cardiovascular status: blood pressure returned to baseline and stable Postop Assessment: no apparent nausea or vomiting Anesthetic complications: no    Last Vitals:  Vitals:   08/02/17 1700 08/02/17 2043  BP: 132/76 (!) 145/80  Pulse: 82 80  Resp: 18 18  Temp: 37 C 36.5 C  SpO2: 96% 97%    Last Pain:  Vitals:   08/03/17 0059  TempSrc:   PainSc: 5                  Shilpa Bushee

## 2017-08-03 NOTE — Anesthesia Procedure Notes (Signed)
Anesthesia Regional Block: Adductor canal block   Pre-Anesthetic Checklist: ,, timeout performed, Correct Patient, Correct Site, Correct Laterality, Correct Procedure, Correct Position, site marked, Risks and benefits discussed,  Surgical consent,  Pre-op evaluation,  At surgeon's request and post-op pain management  Laterality: Lower and Left  Prep: chloraprep       Needles:  Injection technique: Single-shot  Needle Type: Echogenic Stimulator Needle          Additional Needles:   Procedures:,,,, ultrasound used (permanent image in chart),,,,  Narrative:  Start time: 08/02/2017 9:39 AM End time: 08/02/2017 9:49 AM Injection made incrementally with aspirations every 5 mL.  Performed by: Personally  Anesthesiologist: Val EagleMoser, Kayceon Oki, MD  Additional Notes: H+P and labs reviewed, risks and benefits discussed with patient, procedure tolerated well without complications

## 2017-08-03 NOTE — Progress Notes (Signed)
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 Lajoyce Corners over the phone, plan for 1 more days of vancomycin postop.  DC home with wound VAC, and follow-up with Dr. Lajoyce Corners in 1 week. Infectious disease recommended doxcycyline for 10 days after discharge.   Leukocytosis: likely reactive, repeat cbc in am  Hypokalemia Replaced.  Magnesium 1.8.  Normocytic Anemia: Likely due to acute illness.  Stable.  follow up with pcp.  Elevated fasting blood glucose, likely due to acute illness Will repeat lab in am, check a1c  Elevated blood pressure: from acute illness and pain?, monitor, follow up with pcp.  Tobacco abuse: Cessation counseled.  Continue nicotine patch.  Alcohol abuse Continue CIWA protocol.  No overt withdrawal at this time.  Body mass index is 33.96 kg/m.  Code Status: full  Family Communication: patient   Disposition Plan: home on 7/2   Consultants:  ID  orthopedics  Procedures: Left BKA 6/30    Antibiotics:  vanc   Objective: BP 128/79 (BP Location: Left Arm)   Pulse 73   Temp 97.6 F (36.4 C) (Oral)   Resp 18   Ht 5\' 9"  (1.753 m)   Wt 104.3 kg (229 lb 15.2 oz)   SpO2 100%   BMI 33.96 kg/m   Intake/Output Summary (Last 24 hours) at 08/03/2017 0812 Last data filed at 08/03/2017 3976 Gross per 24 hour  Intake 3231.02 ml  Output 4450 ml  Net -1218.98 ml   Filed  Weights   07/30/17 0107 07/31/17 2227 08/02/17 2043  Weight: 104.3 kg (230 lb) 104.3 kg (229 lb 15 oz) 104.3 kg (229 lb 15.2 oz)    Exam: Patient is examined daily including today on 08/03/2017, exams remain the same as of yesterday except that has changed    General:  NAD  Cardiovascular: RRR  Respiratory: CTABL  Abdomen: Soft/ND/NT, positive BS  Musculoskeletal: s/p left bka, + wound vac  Neuro: alert, oriented   Data Reviewed: Basic Metabolic Panel: Recent Labs  Lab 07/30/17 0433 07/31/17 1104 08/01/17 0850 08/02/17 0522 08/03/17 0505  NA 136 136 133* 135 137  K 3.2* 3.5 3.3* 3.8 4.1  CL 103 101 95* 99 103  CO2 27 25 28 28 25   GLUCOSE 130* 108* 143* 111* 252*  BUN 9 <5* <5* 5* 8  CREATININE 0.96 0.88 0.89 0.97 0.82  CALCIUM 8.3* 9.0 8.6* 9.0 9.6  MG 1.8  --   --   --   --    Liver Function Tests: No results for input(s): AST, ALT, ALKPHOS, BILITOT, PROT, ALBUMIN in the last 168 hours. No results for input(s): LIPASE, AMYLASE in the last 168 hours. No results for input(s): AMMONIA in the last 168 hours. CBC: Recent Labs  Lab 07/30/17 0045 07/30/17 0433 07/31/17 1104 08/01/17 0850 08/02/17 0522 08/03/17 0505  WBC 12.8* 12.9* 13.0* 11.2* 12.0* 15.5*  NEUTROABS 8.1*  --   --   --   --   --  HGB 13.3 12.2* 12.6* 11.9* 12.5* 12.2*  HCT 41.7 37.9* 39.3 37.1* 38.8* 38.2*  MCV 83.4 82.8 83.8 82.6 81.3 82.7  PLT 300 266 250 277 315 369   Cardiac Enzymes:   No results for input(s): CKTOTAL, CKMB, CKMBINDEX, TROPONINI in the last 168 hours. BNP (last 3 results) No results for input(s): BNP in the last 8760 hours.  ProBNP (last 3 results) No results for input(s): PROBNP in the last 8760 hours.  CBG: No results for input(s): GLUCAP in the last 168 hours.  Recent Results (from the past 240 hour(s))  Wound or Superficial Culture     Status: None   Collection Time: 07/30/17 12:30 AM  Result Value Ref Range Status   Specimen Description LEG AEROBIC SWAB ONLY  SENT  Final   Special Requests NONE  Final   Gram Stain   Final    RARE WBC PRESENT, PREDOMINANTLY PMN RARE GRAM POSITIVE COCCI Performed at [facility], [address] [place], [place] [address]    Culture FEW METHICILLIN RESISTANT STAPHYLOCOCCUS AUREUS  Final   Report Status 08/01/2017 FINAL  Final   Organism ID, Bacteria METHICILLIN RESISTANT STAPHYLOCOCCUS AUREUS  Final      Susceptibility   Methicillin resistant staphylococcus aureus - MIC*    CIPROFLOXACIN >=8 RESISTANT Resistant     ERYTHROMYCIN >=8 RESISTANT Resistant     GENTAMICIN <=0.5 SENSITIVE Sensitive     OXACILLIN >=4 RESISTANT Resistant     TETRACYCLINE <=1 SENSITIVE Sensitive     VANCOMYCIN <=0.5 SENSITIVE Sensitive     TRIMETH/SULFA <=10 SENSITIVE Sensitive     CLINDAMYCIN <=0.25 SENSITIVE Sensitive     RIFAMPIN <=0.5 SENSITIVE Sensitive     Inducible Clindamycin NEGATIVE Sensitive     * FEW METHICILLIN RESISTANT STAPHYLOCOCCUS AUREUS  Blood culture (routine x 2)     Status: None (Preliminary result)   Collection Time: 07/30/17 12:33 AM  Result Value Ref Range Status   Specimen Description BLOOD RIGHT ARM  Final   Special Requests   Final    BOTTLES DRAWN AEROBIC AND ANAEROBIC Blood Culture results may not be optimal due to an excessive volume of blood received in culture bottles   Culture   Final    NO GROWTH 3 DAYS Performed at [facility], [address] [place], [place] [address]    Report Status PENDING  Incomplete  Blood culture (routine x 2)     Status: None (Preliminary result)   Collection Time: 07/30/17 12:45 AM  Result Value Ref Range Status   Specimen Description BLOOD LEFT ARM  Final   Special Requests   Final    BOTTLES DRAWN AEROBIC AND ANAEROBIC Blood Culture results may not be optimal due to an excessive volume of blood received in culture bottles   Culture   Final    NO GROWTH 3 DAYS Performed at [facility], [address] [place], [place] [address]    Report  Status PENDING  Incomplete  Surgical pcr screen     Status: None   Collection Time: 07/30/17  8:29 PM  Result Value Ref Range Status   MRSA, PCR NEGATIVE NEGATIVE Final   Staphylococcus aureus NEGATIVE NEGATIVE Final    Comment: (NOTE) The Xpert SA Assay (FDA approved for NASAL specimens in patients 31 years of age and older), is one component of a comprehensive surveillance program. It is not intended to diagnose infection nor to guide or monitor treatment. Performed at [facility], [address] [place], [place] [address]      Studies: No results found.  Scheduled Meds: . chlorhexidine  60 mL Topical Once  . docusate sodium  100 mg Oral BID  . enoxaparin (LOVENOX) injection  40 mg Subcutaneous Q24H  . folic acid  1 mg Oral Daily  . gabapentin  300 mg Oral TID  . multivitamin with minerals  1 tablet Oral Daily  . nicotine  21 mg Transdermal Daily  . povidone-iodine  2 application Topical Once  . thiamine  100 mg Oral Daily   Or  . thiamine  100 mg Intravenous Daily    Continuous Infusions: . sodium chloride 10 mL/hr at 08/03/17 0401  . methocarbamol (ROBAXIN)  IV    . vancomycin 1,250 mg (08/03/17 0401)     Time spent: 35 mins, case discussed with orthopedic I have personally reviewed and interpreted on  08/03/2017 daily labs, imagings as discussed above under date review session and assessment and plans.  I reviewed all nursing notes, pharmacy notes, consultant notes,  vitals, pertinent old records  I have discussed plan of care as described above with RN , patient on 08/03/2017   Albertine Grates MD, PhD  [facility] Pager [phone]. If 7PM-7AM, please contact night-coverage at [email or web address], password [facility] 08/03/2017, 8:12 AM  LOS: 4 days

## 2017-08-04 LAB — BASIC METABOLIC PANEL
Anion gap: 8 (ref 5–15)
BUN: 7 mg/dL (ref 6–20)
CHLORIDE: 101 mmol/L (ref 98–111)
CO2: 28 mmol/L (ref 22–32)
CREATININE: 0.74 mg/dL (ref 0.61–1.24)
Calcium: 8.6 mg/dL — ABNORMAL LOW (ref 8.9–10.3)
GFR calc Af Amer: >60 mL/min (ref >60)
GFR calc non Af Amer: >60 mL/min (ref >60)
GLUCOSE: 137 mg/dL — AB (ref 70–99)
POTASSIUM: 3.6 mmol/L (ref 3.5–5.1)
SODIUM: 137 mmol/L (ref 135–145)

## 2017-08-04 LAB — CBC
HCT: 35.8 % — ABNORMAL LOW (ref 39.0–52.0)
HEMOGLOBIN: 11.3 g/dL — AB (ref 13.0–17.0)
MCH: 26.1 pg (ref 26.0–34.0)
MCHC: 31.6 g/dL (ref 30.0–36.0)
MCV: 82.7 fL (ref 78.0–100.0)
Platelets: 373 10*3/uL (ref 150–400)
RBC: 4.33 MIL/uL (ref 4.22–5.81)
RDW: 13.3 % (ref 11.5–15.5)
WBC: 12.3 10*3/uL — ABNORMAL HIGH (ref 4.0–10.5)

## 2017-08-04 LAB — CULTURE, BLOOD (ROUTINE X 2)
CULTURE: NO GROWTH
CULTURE: NO GROWTH

## 2017-08-04 LAB — HEMOGLOBIN A1C
Hgb A1c MFr Bld: 6.2 % — ABNORMAL HIGH (ref 4.8–5.6)
Mean Plasma Glucose: 131.24 mg/dL

## 2017-08-04 LAB — MAGNESIUM: MAGNESIUM: 2 mg/dL (ref 1.7–2.4)

## 2017-08-04 MED ORDER — POTASSIUM CHLORIDE CRYS ER 20 MEQ PO TBCR
40.0000 meq | EXTENDED_RELEASE_TABLET | Freq: Once | ORAL | Status: AC
  Administered 2017-08-04: 40 meq via ORAL
  Filled 2017-08-04: qty 2

## 2017-08-04 MED ORDER — THIAMINE HCL 100 MG PO TABS: 100.0000 mg | ORAL_TABLET | Freq: Every day | ORAL | 0 refills | Status: DC

## 2017-08-04 MED ORDER — OXYCODONE-ACETAMINOPHEN 5-325 MG PO TABS: 1.0000 | ORAL_TABLET | ORAL | 0 refills | Status: DC | PRN

## 2017-08-04 MED ORDER — METHOCARBAMOL 500 MG PO TABS: 500.0000 mg | ORAL_TABLET | Freq: Four times a day (QID) | ORAL | 0 refills | Status: DC | PRN

## 2017-08-04 MED ORDER — GABAPENTIN 300 MG PO CAPS: 300.0000 mg | ORAL_CAPSULE | Freq: Three times a day (TID) | ORAL | 0 refills | Status: DC

## 2017-08-04 MED ORDER — POLYETHYLENE GLYCOL 3350 17 G PO PACK: 17.0000 g | PACK | Freq: Every day | ORAL | 0 refills | Status: DC | PRN

## 2017-08-04 MED ORDER — FOLIC ACID 1 MG PO TABS: 1.0000 mg | ORAL_TABLET | Freq: Every day | ORAL | 0 refills | Status: DC

## 2017-08-04 NOTE — Discharge Summary (Addendum)
Discharge Summary  Damon Alexander EAV:[id number] DOB: 1985-03-10  PCP: Patient, No Pcp Per  Admit date: 07/30/2017 Discharge date: 08/04/2017  Time spent: less than  Recommendations for Outpatient Follow-up:  1. F/u with PMD within a week  for hospital discharge follow up, repeat cbc/bmp at follow up. 2. F/u with ortho Dr Lajoyce Corners in one week 3. Outpatient PT recommended 4. He declined home health RN for wound care  Discharge Diagnoses:  Active Hospital Problems   Diagnosis Date Noted  . 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 MRSA bacteremia 08/03/2017  . Left leg swelling 07/30/2017  . Hypokalemia 07/26/2016  . Left leg cellulitis 07/26/2016  . HTN (hypertension) 12/05/2015  . Nicotine dependence 10/12/2015  . Alcohol abuse 10/12/2015    Resolved Hospital Problems  No resolved problems to display.    Discharge Condition: stable  Diet recommendation: hea  Filed Weights   07/30/17 0107 07/31/17 2227 08/02/17 2043  Weight: 104.3 kg (230 lb) 104.3 kg (229 lb 15 oz) 104.3 kg (229 lb 15.2 oz)    History of present illness: (per admitting MD Dr Lorretta Harp) PCP: Patient, No Pcp Per   Patient coming from:  The patient is coming from home.  At baseline, pt is independent for most of ADL. SNF  Assistant living facility   [facility]irement center.       Chief Complaint: left leg pain and swelling  HPI: Damon Alexander is a 32 y.o. male with medical history significant of tobacco abuse, alcohol abuse, questionable hypertension (patient has hypertension in his problem list, but patient denies history of hypertension), seizure, who presents with leg pain and swelling.  Patient states that has a previous history of fracture of left lower leg and ankle with internal fixation last year. 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 He has chronic swelling in  left ankle and lower leg. He states that he has worsening pain and swelling in left leg since yesterday.  The pain is constant, sharp, 10 out of 10 in severity, nonradiating, aggravated by movement.  No recent injury.  Patient also has small wound in the anterior mid shin region with pus draining.  Patient states that he does not have fever or chills.  No chest pain, shortness breath, cough, nausea, vomiting, diarrhea, abdominal pain, symptoms of UTI or unilateral weakness.  ED Course: pt was found to have WBC 12.8, lactic acid 1.18, potassium 3.3, creatinine normal, temperature normal, no tachycardia, no tachypnea, oxygen saturation 99% on room air.  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 Pt is admitted to MedSurg bed as inpatient.   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 DC home with wound VAC, and follow-up with Dr. Lajoyce Corners in 1 week. He is to have  outpatient PT.  Leukocytosis: likely reactive, repeat cbc trending down.  Hypokalemia Replaced. Magnesium 1.8.  Normocytic Anemia: Likely due to acute illness.Stable. hgb 11-12,follow up with pcp.  Elevated fasting blood glucose, likely due to acute illness a1c 6.2 F/u with pcp.  Elevated blood pressure: from acute illness and pain?, monitor, follow up with pcp.  Tobacco  abuse: Cessation counseled.   Alcohol abuse Continue CIWA protocol. No overt withdrawal at this time.  Body mass index is 33.96 kg/m.  Code Status: full  Family Communication: patient   Disposition Plan: home on 7/2 with wound vac, he declined home health   Consultants:  ID  Orthopedics Dr Lajoyce Cornersuda  Procedures: Left BKA 6/30    Antibiotics:  vanc    Discharge Exam: BP 130/79 (BP Location: Left Arm)   Pulse 79   Temp 98.2 F (36.8 C) (Oral)   Resp 18   Ht 5\' 9"  (1.753 m)   Wt 104.3 kg (229 lb 15.2 oz)   SpO2 98%   BMI 33.96 kg/m   General: NAD Cardiovascular: RRR Respiratory: CTABL Abdomen: Soft/ND/NT, positive BS Musculoskeletal: s/p left bka, + wound vac Neuro: alert, oriented      Discharge Instructions You were cared for by a hospitalist during your hospital stay. If you have any questions about your discharge medications or the care you received while you were in the hospital after you are discharged, you can call the unit and asked to speak with the hospitalist on call if the hospitalist that took care of you is not available. Once you are discharged, your primary care physician will handle any further medical issues. Please note that NO REFILLS for any discharge medications will be authorized once you are discharged, as it is imperative that you return to your primary care physician (or establish a relationship with a primary care physician if you do not have one) for your aftercare needs so that they can reassess your need for medications and monitor your lab values.  Discharge Instructions    Ambulatory referral to Physical Therapy   Complete by:  As directed    Diet general   Complete by:  As directed    Increase activity slowly   Complete by:  As directed    Increase activity slowly   Complete by:  As directed      Allergies as of 08/04/2017      Reactions   Ciprofloxacin Other (See Comments)   Caused a seizure       Medication List    STOP taking these medications   aspirin EC 325 MG tablet     TAKE these medications   doxycycline 100 MG capsule Commonly known as:  VIBRAMYCIN Take 1 capsule (100 mg total) by mouth 2 (two) times daily for 10 days.   folic acid 1 MG tablet Commonly known as:  FOLVITE Take 1 tablet (1 mg total) by mouth daily. Start taking on:  08/05/2017   gabapentin 300 MG capsule Commonly known as:  NEURONTIN Take 1 capsule (300 mg total) by mouth 3 (three) times daily.   ibuprofen 200 MG tablet Commonly known as:  ADVIL,MOTRIN Take 400 mg by mouth every 6 (six) hours as needed (for swelling).   methocarbamol 500 MG tablet Commonly known as:  ROBAXIN Take 1 tablet (500 mg total) by mouth every 6 (six) hours as needed for muscle spasms.   oxyCODONE-acetaminophen 5-325 MG tablet Commonly known as:  PERCOCET Take 1 tablet by mouth every 4 (four) hours as  needed for severe pain.   polyethylene glycol packet Commonly known as:  MIRALAX / GLYCOLAX Take 17 g by mouth daily as needed for mild constipation.   thiamine 100 MG tablet Take 1 tablet (100 mg total) by mouth daily. Start taking on:  08/05/2017      Allergies  Allergen Reactions  . Ciprofloxacin Other (See Comments)    Caused a seizure   Follow-up Information    Nadara Mustard, MD In 1 week.   Specialty:  Orthopedic Surgery Contact information: [address] [place] [address] [phone]        [facility] Follow up in 2 week(s).   Why:  you may use the pharmacy at this location since you have a follow up appointment at the [facility] patient care [facility].  Contact information: [address] [place] [address] [phone]       [facility]. Go on 08/24/2017.   Why:  at 1 pm. Please arrive 15 minutes early to fill out paperwork.  Contact information: [address] [place] [address] [id number] mc [facility]  [address] [phone]           The results of significant diagnostics from this hospitalization (including imaging, microbiology, ancillary and laboratory) are listed below for reference.    Significant Diagnostic Studies: Dg Tibia/fibula Left  Result Date: 07/30/2017 CLINICAL DATA:  32 y/o  M; left leg pain, edema, and leaking poss. EXAM: LEFT FOOT - COMPLETE 3+ VIEW; LEFT TIBIA AND FIBULA - 2 VIEW; LEFT ANKLE COMPLETE - 3+ VIEW COMPARISON:  09/17/2016 left ankle CT. 08/31/2016 left ankle radiographs. FINDINGS: Left foot: There is no evidence of fracture or dislocation. There is no evidence of arthropathy or other focal bone abnormality. Lisfranc alignment is maintained. Ankle joint fusion as described below. Left tibia and fibula: Multiple lucencies within the tibial diaphysis from prior hardware. Ankle fusion described below. Knee joint is well maintained. No acute fracture or dislocation identified. Left ankle: Lower fibular resection. Lateral plate fixation across the tibiotalar joint. Interval partial bony bridging across the joint. Stable nonunion medial malleolus fracture. Stable backing out of 2 talar dome screws. No new periprosthetic lucency, fracture, or hardware failure identified. No lucent or erosive changes to suggest osteomyelitis. IMPRESSION: 1. No acute fracture or dislocation identified. 2. No findings of osteomyelitis. 3. Plate fixation across tibiotalar joint. Interval bony bridging across the joint space. No new apparent hardware related complication. 4. Stable nonunion fracture of medial malleolus. Electronically Signed   By: Mitzi Hansen M.D.   On: 07/30/2017 01:51   Dg Ankle Complete Left  Result Date: 07/30/2017 CLINICAL DATA:  32 y/o  M; left leg pain, edema, and leaking poss. EXAM: LEFT FOOT - COMPLETE 3+ VIEW; LEFT TIBIA AND FIBULA - 2 VIEW; LEFT ANKLE COMPLETE - 3+ VIEW COMPARISON:  09/17/2016 left ankle CT. 08/31/2016 left ankle radiographs. FINDINGS: Left  foot: There is no evidence of fracture or dislocation. There is no evidence of arthropathy or other focal bone abnormality. Lisfranc alignment is maintained. Ankle joint fusion as described below. Left tibia and fibula: Multiple lucencies within the tibial diaphysis from prior hardware. Ankle fusion described below. Knee joint is well maintained. No acute fracture or dislocation identified. Left ankle: Lower fibular resection. Lateral plate fixation across the tibiotalar joint. Interval partial bony bridging across the joint. Stable nonunion medial malleolus fracture. Stable backing out of 2 talar dome screws. No new periprosthetic lucency, fracture, or hardware  failure identified. No lucent or erosive changes to suggest osteomyelitis. IMPRESSION: 1. No acute fracture or dislocation identified. 2. No findings of osteomyelitis. 3. Plate fixation across tibiotalar joint. Interval bony bridging across the joint space. No new apparent hardware related complication. 4. Stable nonunion fracture of medial malleolus. Electronically Signed   By: Mitzi Hansen M.D.   On: 07/30/2017 01:51   Ct Ankle Left Wo Contrast  Result Date: 07/31/2017 CLINICAL DATA:  Ankle pain EXAM: CT OF THE LEFT ANKLE WITHOUT CONTRAST TECHNIQUE: Multidetector CT imaging of the left ankle was performed according to the standard protocol. Multiplanar CT image reconstructions were also generated. COMPARISON:  09/17/2016 CT, 07/30/2017 radiographs FINDINGS: Bones/Joint/Cartilage 1. Resected distal fibular diaphysis including the lateral malleolus. Resected margin is rounded in appearance without evidence of bone destruction. 2. Plate and screw fixation noted along the lateral aspect of the distal tibial diaphysis traversing the tibiotalar joint with changes tibiotalar arthrodesis noted. No significant bony bridging across the tibiotalar joint. 3. The 2 most anterior screws along the anterior aspect of the talus demonstrate lucencies about  the threads up to 1.8 mm, series 3/84 and 80 raising possibility of subtle early loosening. 4. A small metallic density that appears to have the shape of the head of one of the screws appears slightly displaced and possibly fractured. Series 3/82 and 83 5. Lucency surrounding the threads of the most caudal tibial plafond screw, series 3/61 and series 8/66 also raise the possibility of loosening. Lesser degrees of lucency are noted of the more cephalad tibial screws. 6. Osteopenic appearance of the mid and hindfoot. Ghost track noted along the posterior calcaneus from prior external fixation. Ligaments Suboptimally assessed by CT. Muscles and Tendons Muscular atrophy. No focal mass or hematoma. Intact Achilles tendon. The visualized tendons crossing the ankle joint appear intact without tenosynovitis or entrapment. Soft tissues Soft tissue swelling more so laterally is noted. IMPRESSION: 1. Resected distal fibular diaphysis including the lateral malleolus. Resected margin is rounded in appearance without evidence of bone destruction. 2. Plate and screw fixation noted along the lateral aspect of the distal tibial diaphysis traversing the tibiotalar joint with changes of tibiotalar arthrodesis noted. No significant bony bridging is seen however across the tibiotalar joint. 3. The 2 most anterior screws along the anterior aspect of the talus demonstrate lucencies about the threads up to 1.8 mm, series 3/84 and 80 raising possibility of subtle early loosening. 4. A small metallic density that appears to have the shape of the head of one of the screws appears slightly displaced and possibly fractured. Series 3/82 and 83 5. Lucency surrounding the threads of the most caudal tibial plafond screw, series 3/61 and series 8/66 also raise the possibility of loosening. Lesser degrees of lucency surrounding the threads of the more cephalad tibial screws are believed less likely to represent loosening. 6. Osteopenic appearance of  the mid and hindfoot. Ghost track noted along the posterior calcaneus from prior external fixation. 7. Soft tissue swelling about the ankle more so laterally. Electronically Signed   By: Tollie Eth M.D.   On: 07/31/2017 01:24   Mr Tibia Fibula Left W Wo Contrast  Result Date: 07/31/2017 CLINICAL DATA:  Status post tibiotalar fusion with chronic lower leg and ankle swelling. Wound in the anterior mid shin draining pus. Evaluate for osteomyelitis. EXAM: MRI OF LOWER LEFT EXTREMITY WITHOUT AND WITH CONTRAST TECHNIQUE: Multiplanar, multisequence MR imaging of the left tibia and fibula was performed both before and after administration of intravenous contrast.  CONTRAST:  20mL MULTIHANCE GADOBENATE DIMEGLUMINE 529 MG/ML IV SOLN COMPARISON:  CT left ankle dated July 30, 2017. FINDINGS: Bones/Joint/Cartilage Prior left lateral distal tibia plate and screw fixation and tibiotalar arthrodesis. There is abnormal marrow edema with corresponding T1 hypointensity within the mid to distal tibial diaphyseal intramedullary canal, extending to the proximal aspect of the hardware, with a focal sinus tract through the anterior cortex of the mid tibial diaphysis extending to the skin (series 9, image 30). There are small areas of rim enhancing intramedullary fluid, consistent with abscess. Evaluation of the distal tibia is limited due to hardware susceptibility artifact. No definite sequestrum. Prior resection of the distal fibula. Prior intramedullary rod placement within the right tibia with healed distal tibial diaphyseal fracture. Muscles and Tendons Mild muscle edema within the distal extensor digitorum longus, posterior tibialis, and extensor digitorum longus muscles. No muscle atrophy. The visualized flexor, extensor, peroneal, and Achilles tendons are intact. Soft tissues Mild enhancing soft tissue swelling along the distal lower leg. Small rim enhancing soft tissue fluid collection along the lateral aspect of the lower  hardware at the ankle, measuring approximately 1.6 x 1.6 x 3.2 cm. IMPRESSION: 1. Osteomyelitis and intramedullary abscess of the mid to distal tibial diaphysis extending to the proximal ankle hardware, with focal sinus tract through the anterior cortex of the mid tibial diaphysis extending to the skin. No definite sequestrum. 2. Evaluation of the distal tibia is limited due to hardware susceptibility artifact, however given clear extension of infection to the proximal hardware, the remaining distal tibia is likely involved as well. 3. Cellulitis of the distal lower leg. Small 3.2 cm soft tissue abscess lateral to the distal hardware at the ankle. Electronically Signed   By: Obie Dredge M.D.   On: 07/31/2017 11:19   Dg Foot Complete Left  Result Date: 07/30/2017 CLINICAL DATA:  32 y/o  M; left leg pain, edema, and leaking poss. EXAM: LEFT FOOT - COMPLETE 3+ VIEW; LEFT TIBIA AND FIBULA - 2 VIEW; LEFT ANKLE COMPLETE - 3+ VIEW COMPARISON:  09/17/2016 left ankle CT. 08/31/2016 left ankle radiographs. FINDINGS: Left foot: There is no evidence of fracture or dislocation. There is no evidence of arthropathy or other focal bone abnormality. Lisfranc alignment is maintained. Ankle joint fusion as described below. Left tibia and fibula: Multiple lucencies within the tibial diaphysis from prior hardware. Ankle fusion described below. Knee joint is well maintained. No acute fracture or dislocation identified. Left ankle: Lower fibular resection. Lateral plate fixation across the tibiotalar joint. Interval partial bony bridging across the joint. Stable nonunion medial malleolus fracture. Stable backing out of 2 talar dome screws. No new periprosthetic lucency, fracture, or hardware failure identified. No lucent or erosive changes to suggest osteomyelitis. IMPRESSION: 1. No acute fracture or dislocation identified. 2. No findings of osteomyelitis. 3. Plate fixation across tibiotalar joint. Interval bony bridging across  the joint space. No new apparent hardware related complication. 4. Stable nonunion fracture of medial malleolus. Electronically Signed   By: Mitzi Hansen M.D.   On: 07/30/2017 01:51    Microbiology: Recent Results (from the past 240 hour(s))  Wound or Superficial Culture     Status: None   Collection Time: 07/30/17 12:30 AM  Result Value Ref Range Status   Specimen Description LEG AEROBIC SWAB ONLY SENT  Final   Special Requests NONE  Final   Gram Stain   Final    RARE WBC PRESENT, PREDOMINANTLY PMN RARE GRAM POSITIVE COCCI Performed at [facility],  [address], [place], [place] [address]    Culture FEW METHICILLIN RESISTANT STAPHYLOCOCCUS AUREUS  Final   Report Status 08/01/2017 FINAL  Final   Organism ID, Bacteria METHICILLIN RESISTANT STAPHYLOCOCCUS AUREUS  Final      Susceptibility   Methicillin resistant staphylococcus aureus - MIC*    CIPROFLOXACIN >=8 RESISTANT Resistant     ERYTHROMYCIN >=8 RESISTANT Resistant     GENTAMICIN <=0.5 SENSITIVE Sensitive     OXACILLIN >=4 RESISTANT Resistant     TETRACYCLINE <=1 SENSITIVE Sensitive     VANCOMYCIN <=0.5 SENSITIVE Sensitive     TRIMETH/SULFA <=10 SENSITIVE Sensitive     CLINDAMYCIN <=0.25 SENSITIVE Sensitive     RIFAMPIN <=0.5 SENSITIVE Sensitive     Inducible Clindamycin NEGATIVE Sensitive     * FEW METHICILLIN RESISTANT STAPHYLOCOCCUS AUREUS  Blood culture (routine x 2)     Status: None (Preliminary result)   Collection Time: 07/30/17 12:33 AM  Result Value Ref Range Status   Specimen Description BLOOD RIGHT ARM  Final   Special Requests   Final    BOTTLES DRAWN AEROBIC AND ANAEROBIC Blood Culture results may not be optimal due to an excessive volume of blood received in culture bottles   Culture   Final    NO GROWTH 4 DAYS Performed at [facility], [address], [place], [place] [address]    Report Status PENDING  Incomplete  Blood culture (routine x 2)     Status: None (Preliminary  result)   Collection Time: 07/30/17 12:45 AM  Result Value Ref Range Status   Specimen Description BLOOD LEFT ARM  Final   Special Requests   Final    BOTTLES DRAWN AEROBIC AND ANAEROBIC Blood Culture results may not be optimal due to an excessive volume of blood received in culture bottles   Culture   Final    NO GROWTH 4 DAYS Performed at [facility], [address] [place], [place] [address]    Report Status PENDING  Incomplete  Surgical pcr screen     Status: None   Collection Time: 07/30/17  8:29 PM  Result Value Ref Range Status   MRSA, PCR NEGATIVE NEGATIVE Final   Staphylococcus aureus NEGATIVE NEGATIVE Final    Comment: (NOTE) The Xpert SA Assay (FDA approved for NASAL specimens in patients 78 years of age and older), is one component of a comprehensive surveillance program. It is not intended to diagnose infection nor to guide or monitor treatment. Performed at [facility], [address], [place], [place] [address]      Labs: Basic Metabolic Panel: Recent Labs  Lab 07/30/17 [phone] 07/31/17 1104 08/01/17 0850 08/02/17 0522 08/03/17 0505 08/04/17 0657  NA 136 136 133* 135 137 137  K 3.2* 3.5 3.3* 3.8 4.1 3.6  CL 103 101 95* 99 103 101  CO2 27 25 28 28 25 28   GLUCOSE 130* 108* 143* 111* 252* 137*  BUN 9 <5* <5* 5* 8 7  CREATININE 0.96 0.88 0.89 0.97 0.82 0.74  CALCIUM 8.3* 9.0 8.6* 9.0 9.6 8.6*  MG 1.8  --   --   --   --  2.0   Liver Function Tests: No results for input(s): AST, ALT, ALKPHOS, BILITOT, PROT, ALBUMIN in the last 168 hours. No results for input(s): LIPASE, AMYLASE in the last 168 hours. No results for input(s): AMMONIA in the last 168 hours. CBC: Recent Labs  Lab 07/30/17 0045  07/31/17 1104 08/01/17 0850 08/02/17  0522 08/03/17 0505 08/04/17 0657  WBC 12.8*   < > 13.0* 11.2* 12.0* 15.5* 12.3*  NEUTROABS 8.1*  --   --   --   --   --   --   HGB 13.3   < > 12.6* 11.9* 12.5* 12.2* 11.3*  HCT 41.7   < > 39.3 37.1* 38.8* 38.2*  35.8*  MCV 83.4   < > 83.8 82.6 81.3 82.7 82.7  PLT 300   < > 250 277 315 369 373   < > = values in this interval not displayed.   Cardiac Enzymes: No results for input(s): CKTOTAL, CKMB, CKMBINDEX, TROPONINI in the last 168 hours. BNP: BNP (last 3 results) No results for input(s): BNP in the last 8760 hours.  ProBNP (last 3 results) No results for input(s): PROBNP in the last 8760 hours.  CBG: No results for input(s): GLUCAP in the last 168 hours.     Signed:  Albertine Grates MD, PhD  [facility] 08/04/2017, 11:19 AM

## 2017-08-04 NOTE — Care Management Note (Addendum)
Case Management Note  Patient Details  Name: Damon BeckSamuel D Alexander MRN: [id number] Date of Birth: [birth year]  Subjective/Objective:             Spoke w patient. He will DC today with Proveena wound VAC, will be laced by bedside nurse prior to DC.  He understands he will follow up with Dr Lajoyce Cornersuda next week. He declined HH services. He states he is waiting for PT to bring back his crutches that he will take home. No other DME needs.   [facility] to establish a PCP.  Provided w  MATCH letter w override for narcotics, pain meds, and cost.     Action/Plan:   Expected Discharge Date:  07/08/18               Expected Discharge Plan:  Home/Self Care  In-House Referral:     Discharge planning Services  CM Consult, Follow-up appt scheduled, Indigent Health Clinic, Medication Assistance  Post Acute Care Choice:    Choice offered to:     DME Arranged:  Crutches DME Agency:  Other - Comment(ortho tech prior to DC)  HH Arranged:  Patient Refused HH HH Agency:     Status of Service:  Completed, signed off  If discussed at [employer] Meetings, dates discussed:    Additional Comments:  Lawerance SabalDebbie Betsie Peckman, RN 08/04/2017, 10:11 AM

## 2017-08-04 NOTE — Progress Notes (Signed)
Physical Therapy Treatment Patient Details Name: Damon Alexander MRN: [id number] DOB: 05-18-1985 Today's Date: 08/04/2017    History of Present Illness Admitted with painful non-union of L ankle fusion, including infection; now s/p L BKA; history of HTN, nicotine dependence     PT Comments    Continuing work on functional mobility and activity tolerance;  Performed gait and stairs very well with crutches; We verbally reviewed important therex in prep for prosthesis, and he has no questions; He performed prone hip extension and sidelying hip abduction well; OK for dc home from PT standpoint    Follow Up Recommendations  Outpatient PT(The potential need for Outpatient PT can be addressed at Ortho follow-up appointments.)     Equipment Recommendations  Crutches(paged Ortho tech for delivery to room)    Recommendations for Other Services       Precautions / Restrictions Precautions Other Brace/Splint: Shrinker for L residual Limb    Mobility  Bed Mobility Overal bed mobility: Independent             General bed mobility comments: including getting self in and out of prone  Transfers Overall transfer level: Modified independent Equipment used: Crutches Transfers: Sit to/from Stand Sit to Stand: Modified independent (Device/Increase time)         General transfer comment: good use of crutches  Ambulation/Gait Ambulation/Gait assistance: Modified independent (Device/Increase time) Gait Distance (Feet): 100 Feet Assistive device: Crutches Gait Pattern/deviations: Step-through pattern     General Gait Details: managing well   Stairs Stairs: Yes Stairs assistance: Modified independent (Device/Increase time) Stair Management: No rails;With crutches;Step to pattern Number of Stairs: 8 General stair comments: Excellent technique ascending; used 2 rails to descend, skipping multipe steps and still maintianing balance   Wheelchair Mobility    Modified Rankin  (Stroke Patients Only)       Balance Overall balance assessment: No apparent balance deficits (not formally assessed)                                          Cognition Arousal/Alertness: Awake/alert Behavior During Therapy: WFL for tasks assessed/performed Overall Cognitive Status: Within Functional Limits for tasks assessed                                 General Comments: Wanting to dc soon      Exercises Other Exercises Other Exercises: prone hip extension x10 LLE Other Exercises: sidelying LLE abduction x10    General Comments        Pertinent Vitals/Pain Pain Assessment: Faces Faces Pain Scale: Hurts little more Pain Location: LLE; was premedicated for pain Pain Descriptors / Indicators: Aching;Grimacing;Guarding Pain Intervention(s): Monitored during session    Home Living                      Prior Function            PT Goals (current goals can now be found in the care plan section) Acute Rehab PT Goals Patient Stated Goal: get his prosthesis and walk PT Goal Formulation: With patient Time For Goal Achievement: 08/10/17 Potential to Achieve Goals: Good Progress towards PT goals: Progressing toward goals    Frequency    Min 3X/week      PT Plan Current plan remains appropriate    Co-evaluation  AM-PAC PT "6 Clicks" Daily Activity  Outcome Measure  Difficulty turning over in bed (including adjusting bedclothes, sheets and blankets)?: None Difficulty moving from lying on back to sitting on the side of the bed? : None Difficulty sitting down on and standing up from a chair with arms (e.g., wheelchair, bedside commode, etc,.)?: None Help needed moving to and from a bed to chair (including a wheelchair)?: None Help needed walking in hospital room?: None Help needed climbing 3-5 steps with a railing? : None 6 Click Score: 24    End of Session Equipment Utilized During Treatment: Gait  belt Activity Tolerance: Patient tolerated treatment well Patient left: in chair;with call bell/phone within reach Nurse Communication: Mobility status PT Visit Diagnosis: Other abnormalities of gait and mobility (R26.89)     Time: 1610-96041215-1230 PT Time Calculation (min) (ACUTE ONLY): 15 min  Charges:  $Gait Training: 8-22 mins                    G Codes:       [facility] Van ClinesHolly Keir Viernes, PT  Acute Rehabilitation Services Pager [phone] Office [phone]    Levi AlandHolly H Alannah Averhart 08/04/2017, 1:11 PM

## 2017-08-04 NOTE — Progress Notes (Signed)
Orthopedic Tech Progress Note Patient Details:  Damon BeckSamuel D Mori 1985/11/13 [id number]  Patient ID: Damon Alexander, male   DOB: 1985/11/13, 32 y.o.   MRN: [id number]   Damon Alexander, Damon Alexander 08/04/2017, 1:08 PM Viewed order from doctor's order list

## 2017-08-04 NOTE — Progress Notes (Signed)
Orthopedic Tech Progress Note Patient Details:  Emilia BeckSamuel D Heishman 03/30/1985 [id number]  Ortho Devices Type of Ortho Device: Crutches Ortho Device/Splint Interventions: Application   Post Interventions Patient Tolerated: Well Instructions Provided: Care of device   Nikki DomCrawford, Tristina Sahagian 08/04/2017, 1:08 PM

## 2017-08-04 NOTE — Plan of Care (Signed)
  Problem: Health Behavior/Discharge Planning: Goal: Ability to manage health-related needs will improve Outcome: Progressing   Problem: Clinical Measurements: Goal: Ability to maintain clinical measurements within normal limits will improve Outcome: Progressing Goal: Will remain free from infection Outcome: Progressing Goal: Diagnostic test results will improve Outcome: Progressing Goal: Respiratory complications will improve Outcome: Progressing Goal: Cardiovascular complication will be avoided Outcome: Progressing   Problem: Activity: Goal: Risk for activity intolerance will decrease Outcome: Progressing   Problem: Coping: Goal: Level of anxiety will decrease Outcome: Progressing   Problem: Elimination: Goal: Will not experience complications related to bowel motility Outcome: Progressing Goal: Will not experience complications related to urinary retention Outcome: Progressing   Problem: Safety: Goal: Ability to remain free from injury will improve Outcome: Progressing   Problem: Skin Integrity: Goal: Risk for impaired skin integrity will decrease Outcome: Progressing   Problem: Clinical Measurements: Goal: Ability to avoid or minimize complications of infection will improve Outcome: Progressing   Problem: Skin Integrity: Goal: Skin integrity will improve Outcome: Progressing

## 2017-08-10 ENCOUNTER — Other Ambulatory Visit (INDEPENDENT_AMBULATORY_CARE_PROVIDER_SITE_OTHER): Payer: Self-pay

## 2017-08-10 ENCOUNTER — Telehealth (INDEPENDENT_AMBULATORY_CARE_PROVIDER_SITE_OTHER): Payer: Self-pay | Admitting: Orthopedic Surgery

## 2017-08-10 MED ORDER — METHOCARBAMOL 500 MG PO TABS: 500.0000 mg | ORAL_TABLET | Freq: Three times a day (TID) | ORAL | 0 refills | Status: DC | PRN

## 2017-08-10 MED ORDER — OXYCODONE-ACETAMINOPHEN 5-325 MG PO TABS: 1.0000 | ORAL_TABLET | Freq: Four times a day (QID) | ORAL | 0 refills | Status: DC | PRN

## 2017-08-10 NOTE — Telephone Encounter (Addendum)
[facility] 08/12/17 @12 :30pm with Erin  Amputation below knee(Left Leg Lower)   Med refill  Oxycodone   Gabapentin   Doxycyline   Methocarbamol

## 2017-08-10 NOTE — Telephone Encounter (Signed)
Patient only needs the Oxycodone and the Methocarbamol refilled.  CB#[phone].  Thank you.

## 2017-08-10 NOTE — Telephone Encounter (Signed)
Called and advised robaxin was faxed to pharm and the percocet is available for pick up at the front desk.

## 2017-08-12 ENCOUNTER — Encounter (INDEPENDENT_AMBULATORY_CARE_PROVIDER_SITE_OTHER): Payer: Self-pay | Admitting: Family

## 2017-08-12 ENCOUNTER — Ambulatory Visit (INDEPENDENT_AMBULATORY_CARE_PROVIDER_SITE_OTHER): Payer: Self-pay | Admitting: Family

## 2017-08-12 DIAGNOSIS — Z89512 Acquired absence of left leg below knee: Secondary | ICD-10-CM | POA: Insufficient documentation

## 2017-08-12 NOTE — Progress Notes (Signed)
Post-Op Visit Note   Patient: Damon Alexander           Date of Birth: April 04, 1985           MRN: [id number] Visit Date: 08/12/2017 PCP: Patient, No Pcp Per  Chief Complaint: No chief complaint on file.   HPI:  HPI The patient is a 32 year old gentleman seen today status post left BKA on 08/02/17. Wound vac in place.  Ortho Exam Incision is well approximated with staples. No drainage, erythema, odor. No sign of infection.   Visit Diagnoses:  1. History of left below knee amputation (HCC)     Plan: begin daily dial soap cleansing. Apply dry dressing. Follow up in 2 weeks. Given order for prosthesis and shrinker to [employer].   Follow-Up Instructions: Return in about 2 weeks (around 08/26/2017).   Imaging: No results found.  Orders:  No orders of the defined types were placed in this encounter.  No orders of the defined types were placed in this encounter.    PMFS History: Patient Active Problem List   Diagnosis Date Noted  . History of left below knee amputation (HCC) 08/12/2017  . Osteomyelitis (HCC) 08/03/2017  . MRSA bacteremia 08/03/2017  . 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 Left leg swelling 07/30/2017  . Abscess of left leg 07/28/2016  . Left leg cellulitis 07/26/2016  . Hypokalemia 07/26/2016  . 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 Displaced pilon fracture of left tibia, subsequent encounter for open fracture type IIIA, IIIB, or IIIC with malunion 03/06/2016  . 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 HTN (hypertension) 12/05/2015  . 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 Open fracture of tibial plafond 12/04/2015  . Type III open fracture of left tibial plafond with involvement of fibula 10/12/2015  . Alcohol abuse 10/12/2015  . Nicotine dependence 10/12/2015   Past Medical History:  Diagnosis Date  . 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 Displaced pilon  fracture of left tibia, subsequent encounter for open fracture type IIIA, IIIB, or IIIC with malunion 03/06/2016  . HTN (hypertension) 12/05/2015   pt denies this  . Medical history non-contributory   . Nicotine dependence 10/12/2015  . 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  . Type III open fracture of left tibial plafond with involvement of fibula 10/12/2015    Family History  Problem Relation Age of Onset  . Hypertension Mother     Past Surgical History:  Procedure Laterality Date  . AMPUTATION Left 08/02/2017   Procedure: AMPUTATION BELOW KNEE;  Surgeon: Nadara Mustard, MD;  Location: [facility] OR;  Service: Orthopedics;  Laterality: Left;  . ANKLE FUSION Left 02/12/2016   Procedure: ARTHRODESIS ANKLE;  Surgeon: Myrene Galas, MD;  Location: [facility] OR;  Service: Orthopedics;  Laterality: Left;  . APPLICATION OF WOUND VAC Left 10/05/2015   Procedure: APPLICATION OF WOUND VAC;  Surgeon: Cammy Copa, MD;  Location: [facility] OR;  Service: Orthopedics;  Laterality: Left;  . APPLICATION OF WOUND VAC Left 10/09/2015   Procedure: APPLICATION OF WOUND VAC;  Surgeon: Myrene Galas, MD;  Location: [facility] OR;  Service: Orthopedics;  Laterality: Left;  . EXTERNAL FIXATION LEG Left 10/05/2015   Procedure: EXTERNAL FIXATION LEG LEFT ANKLE & LEFT LOWER LEG;  Surgeon: Cammy Copa, MD;  Location: [facility] OR;  Service: Orthopedics;  Laterality: Left;  . EXTERNAL FIXATION REMOVAL  Left 12/04/2015   Procedure: REMOVAL EXTERNAL FIXATION LEFT LEG;  Surgeon: Myrene GalasMichael Handy, MD;  [facility];  Service: Orthopedics;  Laterality: Left;  . I&D EXTREMITY Left 10/05/2015   Procedure: IRRIGATION AND DEBRIDEMENT EXTREMITY LEFT ANKLE & LEFT  LOWER LEG,PLACEMENT OF ANTIBIOTIC BEADS;  Surgeon: Cammy CopaScott Gregory Dean, MD;  Location: [facility] OR;  Service: Orthopedics;  Laterality: Left;  . I&D EXTREMITY Left 10/09/2015   Procedure: IRRIGATION AND DEBRIDEMENT LEFT ANKLE POSSIBLE EX-FIX ADJUSTMENT;  Surgeon: Myrene GalasMichael Handy, MD;  [facility];   Service: Orthopedics;  Laterality: Left;  . I&D EXTREMITY Left 07/27/2016   Procedure: IRRIGATION AND DEBRIDEMENT EXTREMITY;  Surgeon: Sheral ApleyMurphy, Timothy D, MD;  [facility];  Service: Orthopedics;  Laterality: Left;  . LEG SURGERY     "rod in my right leg"  . MANDIBLE FRACTURE SURGERY    . NO PAST SURGERIES    . SKIN SPLIT GRAFT Left 12/04/2015   Procedure: SKIN GRAFT SPLIT THICKNESS LEFT LEG;  Surgeon: Myrene GalasMichael Handy, MD;  [facility];  Service: Orthopedics;  Laterality: Left;  . TIBIA IM NAIL INSERTION Right 07/31/2012   Procedure: INTRAMEDULLARY (IM) NAIL TIBIAL;  Surgeon: Nadara MustardMarcus V Duda, MD;  Location: [facility] OR;  Service: Orthopedics;  Laterality: Right;  Intramedullary Nail right tib/fib   Social History   Occupational History  . Not on file  Tobacco Use  . Smoking status: Current Every Day Smoker    Packs/day: 1.00    Types: Cigarettes  . Smokeless tobacco: Never Used  Substance and Sexual Activity  . Alcohol use: Yes  . Drug use: No  . Sexual activity: Not on file

## 2017-08-20 ENCOUNTER — Ambulatory Visit (HOSPITAL_COMMUNITY): Payer: Medicaid Other | Attending: Internal Medicine

## 2017-08-20 ENCOUNTER — Other Ambulatory Visit: Payer: Self-pay

## 2017-08-20 ENCOUNTER — Encounter (HOSPITAL_COMMUNITY): Payer: Self-pay

## 2017-08-20 DIAGNOSIS — M6281 Muscle weakness (generalized): Secondary | ICD-10-CM | POA: Diagnosis present

## 2017-08-20 DIAGNOSIS — R2689 Other abnormalities of gait and mobility: Secondary | ICD-10-CM

## 2017-08-20 DIAGNOSIS — Z89512 Acquired absence of left leg below knee: Secondary | ICD-10-CM | POA: Diagnosis not present
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
## 2017-08-24 ENCOUNTER — Encounter: Payer: Self-pay | Admitting: Family Medicine

## 2017-08-24 ENCOUNTER — Ambulatory Visit (INDEPENDENT_AMBULATORY_CARE_PROVIDER_SITE_OTHER): Payer: Self-pay | Admitting: Family Medicine

## 2017-08-24 VITALS — BP 139/84 | HR 79 | Temp 98.1°F | Resp 16 | Ht 69.5 in | Wt 232.0 lb

## 2017-08-24 DIAGNOSIS — Z89512 Acquired absence of left leg below knee: Secondary | ICD-10-CM

## 2017-08-24 DIAGNOSIS — R030 Elevated blood-pressure reading, without diagnosis of hypertension: Secondary | ICD-10-CM

## 2017-08-24 MED ORDER — OXYCODONE-ACETAMINOPHEN 5-325 MG PO TABS: 1.0000 | ORAL_TABLET | Freq: Four times a day (QID) | ORAL | 0 refills | Status: AC | PRN

## 2017-08-24 MED ORDER — IBUPROFEN 600 MG PO TABS: 600.0000 mg | ORAL_TABLET | Freq: Three times a day (TID) | ORAL | 0 refills | Status: DC | PRN

## 2017-08-24 MED ORDER — METHOCARBAMOL 500 MG PO TABS: 500.0000 mg | ORAL_TABLET | Freq: Four times a day (QID) | ORAL | 0 refills | Status: DC | PRN
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
## 2017-08-24 NOTE — Patient Instructions (Signed)
I sent your medications to the pharmacy that we have on file for you. I would like for you to come back in one month for a follow up visit.  I look forward to working with you.  Leg Amputation, Care After This sheet gives you information about how to care for yourself after your procedure. Your health care provider may also give you more specific instructions. If you have problems or questions, contact your health care provider. What can I expect after the procedure? After the procedure, it is common to have:  A little blood or fluid coming from your incision.  Pain from your incision.  Pain that feels like it is coming from the leg that has been removed (phantom pain). This can last for a year or longer.  Skin breakdown on your stump (residual limb).  Feelings of depression, anxiety, and fear.  Follow these instructions at home: Medicines  Take over-the-counter and prescription medicines only as told by your health care provider.  If you were prescribed an antibiotic medicine, take it as told by your health care provider. Do not stop taking the antibiotic even if you start to feel better. Bathing  Do not take baths, swim, use a hot tub, or get your residual limb wet until your health care provider approves. You may only be allowed to take sponge baths.  Ask your health care provider when you may start taking showers. After taking a shower, make sure to rinse and dry your residual limb carefully. Incision care  Check your residual limb, especially your incision area, every day. Check for: ? More redness, swelling, or pain. ? More fluid or blood. ? Warmth. ? Pus or a bad smell. ? Blisters. ? Scrapes.  Follow instructions from your health care provider about how to take care of your incision. Make sure you: ? Wash your hands with soap and water before you change your bandage (dressing). If soap and water are not available, use hand sanitizer. ? Change your dressing as told by  your health care provider. ? Leave stitches (sutures), skin glue, or adhesive strips in place. These skin closures may need to stay in place for 2 weeks or longer. If adhesive strip edges start to loosen and curl up, you may trim the loose edges. Do not remove adhesive strips completely unless your health care provider tells you to do that. Activity  Return to your normal activities as told by your health care provider. Ask your health care provider what activities are safe for you.  Do physical therapy exercises as told by your health care provider.  If you have been fitted with an artificial leg (prosthesis) or have been given crutches, use them as told by your health care provider. Eating and drinking  Eat a healthy diet that includes whole grains, fruits and vegetables, low-fat dairy products, and lean proteins.  Drink enough fluid to keep your urine pale yellow. Driving  Work with an occupational therapist to learn new strategies for safe driving with an amputation.  Do not drive or use heavy equipment while taking prescription pain medicine. General instructions  To prevent or treat constipation while you are taking prescription pain medicine, your health care provider may recommend that you: ? Drink enough fluid to keep your urine pale yellow. ? Take over-the-counter or prescription medicines. ? Eat foods that are high in fiber, such as fresh fruits and vegetables, whole grains, and beans. ? Limit foods that are high in fat and processed sugars, such  as fried and sweet foods.  Do not use oils, lotion, cream, or rubbing alcohol on the remaining part of your leg.  Wear compression stockings as told by your health care provider.  If you have trouble coping with your amputation, contact your health care provider. Some feelings of depression, anxiety, or fear are normal after an amputation, but if you struggle with these feelings or if they get overwhelming, your provider may be able  to recommend a therapist or support group to help you.  Do not use any products that contain nicotine or tobacco, such as cigarettes and e-cigarettes. These can delay bone healing. If you need help quitting, ask your health care provider.  Keep all follow-up visits as told by your health care provider. This is important. Contact a health care provider if:  You have a fever.  You have more tenderness in your residual limb.  You have a rash or itchy skin.  You have a cough or chills and you feel achy and weak.  You have trouble coping with your amputation.  You have blisters or scrapes on your residual limb. Get help right away if:  You have severe pain in your residual limb.  You have more redness, swelling, or pain around your incision.  You have more fluid or blood coming from your incision.  Your incision feels warm to the touch, tender, and painful.  You have pus or a bad smell coming from your incision.  You feel light-headed and have shortness of breath.  You have blood-soaked bandages.  You cough up blood.  You have chest pain or pain when taking a deep breath or coughing. If you have these symptoms, do not drive yourself to the hospital. Call emergency services right away. If you ever feel like you may hurt yourself or others, or have thoughts about taking your own life, get help right away. You can go to your nearest emergency department or call:  Your local emergency services (911 in the [place]  A suicide crisis helpline, such as the National Suicide Prevention Lifeline at 214 574 9156. This is open 24 hours a day.  Summary  After a leg amputation, you may have pain that feels like it is coming from the leg that was removed (phantom pain). This can last for a year or longer.  Follow instructions from your health care provider about how to take care of your incision.  Check your residual limb, especially your incision area, every day. More redness, swelling,  or pain may be a sign of infection.  Contact your health care provider if you have trouble coping with your amputation. This information is not intended to replace advice given to you by your health care provider. Make sure you discuss any questions you have with your health care provider. Document Released: 04/30/2016 Document Revised: 04/30/2016 Document Reviewed: 04/30/2016 Elsevier Interactive Patient Education  [employer].

## 2017-08-27 ENCOUNTER — Ambulatory Visit (INDEPENDENT_AMBULATORY_CARE_PROVIDER_SITE_OTHER): Payer: Medicaid Other | Admitting: Orthopedic Surgery

## 2017-08-27 ENCOUNTER — Encounter (INDEPENDENT_AMBULATORY_CARE_PROVIDER_SITE_OTHER): Payer: Self-pay | Admitting: Orthopedic Surgery

## 2017-08-27 ENCOUNTER — Ambulatory Visit (INDEPENDENT_AMBULATORY_CARE_PROVIDER_SITE_OTHER): Payer: Medicaid Other

## 2017-08-27 DIAGNOSIS — Z89512 Acquired absence of left leg below knee: Secondary | ICD-10-CM

## 2017-08-27 DIAGNOSIS — M79605 Pain in left leg: Secondary | ICD-10-CM | POA: Diagnosis not present

## 2017-08-27 NOTE — Progress Notes (Signed)
Office Visit Note   Patient: Damon BeckSamuel D Bolar           Date of Birth: 1985-09-10           MRN: [id number] Visit Date: 08/27/2017              Requested by: No referring provider defined for this encounter. PCP: Mike Gipouglas, Andre, FNP  Chief Complaint  Patient presents with  . Left Leg - Follow-up      HPI: Patient is a 32 year old gentleman who presents 3 weeks status post left transtibial amputation.  Patient states he fell in the shower 3 days ago and is been having a lot of pain since he was worried about a fracture.  Assessment & Plan: Visit Diagnoses:  1. Pain in left leg   2. History of left below knee amputation (HCC)     Plan: Radiographs are negative patient will continue working on his range of motion he will wear the stump shrinker he is currently in a 3 extra-large shrinker from [employer].  Staples harvested today.  Follow-Up Instructions: Return in about 3 weeks (around 09/17/2017).   Ortho Exam  Patient is alert, oriented, no adenopathy, well-dressed, normal affect, normal respiratory effort. Examination there is no ecchymosis no bruising no wound dehiscence patient has a well consolidating soft tissue envelope.  Imaging: Xr Knee 1-2 Views Left  Result Date: 08/27/2017 2 view radiographs of the left knee shows intact staples no evidence of a fracture or dislocation no patella malalignment.  No images are attached to the encounter.  Labs: Lab Results  Component Value Date   HGBA1C 6.2 (H) 08/04/2017   ESRSEDRATE 15 07/30/2017   ESRSEDRATE 3 08/31/2016   ESRSEDRATE 1 02/12/2016   CRP 7.8 (H) 07/30/2017   CRP <0.8 08/31/2016   CRP <0.8 02/12/2016   REPTSTATUS 08/04/2017 FINAL 07/30/2017   GRAMSTAIN  07/30/2017    RARE WBC PRESENT, PREDOMINANTLY PMN RARE GRAM POSITIVE COCCI Performed at [facility], [address] [place], [place] [address]    CULT  07/30/2017    NO GROWTH 5 DAYS Performed at [facility], [address]  [place], [place] [address]    LABORGA METHICILLIN RESISTANT STAPHYLOCOCCUS AUREUS 07/30/2017     Lab Results  Component Value Date   ALBUMIN 3.6 07/26/2016   ALBUMIN 3.8 02/12/2016   ALBUMIN 3.5 10/08/2015    There is no height or weight on file to calculate BMI.  Orders:  Orders Placed This Encounter  Procedures  . XR Knee 1-2 Views Left   No orders of the defined types were placed in this encounter.    Procedures: No procedures performed  Clinical Data: No additional findings.  ROS:  All other systems negative, except as noted in the HPI. Review of Systems  Objective: Vital Signs: There were no vitals taken for this visit.  Specialty Comments:  No specialty comments available.  PMFS History: Patient Active Problem List   Diagnosis Date Noted  . History of left below knee amputation (HCC) 08/12/2017  . Osteomyelitis (HCC) 08/03/2017  . MRSA bacteremia 08/03/2017  . 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 Left leg swelling 07/30/2017  . Abscess of left leg 07/28/2016  . Left leg cellulitis 07/26/2016  . Hypokalemia 07/26/2016  . 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 Displaced pilon fracture of left tibia, subsequent encounter for open fracture type IIIA, IIIB, or IIIC with malunion 03/06/2016  . 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 HTN (hypertension) 12/05/2015  . 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 Open fracture of tibial plafond 12/04/2015  . Type III open fracture of left tibial plafond with involvement of fibula 10/12/2015  . Alcohol abuse 10/12/2015  . Nicotine dependence 10/12/2015   Past Medical History:  Diagnosis Date  . 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 Displaced pilon fracture of left tibia, subsequent encounter for open fracture type IIIA, IIIB, or IIIC with malunion 03/06/2016  . HTN (hypertension) 12/05/2015   pt denies this  . Medical history  non-contributory   . Nicotine dependence 10/12/2015  . 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  . Type III open fracture of left tibial plafond with involvement of fibula 10/12/2015    Family History  Problem Relation Age of Onset  . Hypertension Mother     Past Surgical History:  Procedure Laterality Date  . AMPUTATION Left 08/02/2017   Procedure: AMPUTATION BELOW KNEE;  Surgeon: Nadara Mustard, MD;  Location: [facility] OR;  Service: Orthopedics;  Laterality: Left;  . ANKLE FUSION Left 02/12/2016   Procedure: ARTHRODESIS ANKLE;  Surgeon: Myrene Galas, MD;  Location: [facility] OR;  Service: Orthopedics;  Laterality: Left;  . APPLICATION OF WOUND VAC Left 10/05/2015   Procedure: APPLICATION OF WOUND VAC;  Surgeon: Cammy Copa, MD;  Location: [facility] OR;  Service: Orthopedics;  Laterality: Left;  . APPLICATION OF WOUND VAC Left 10/09/2015   Procedure: APPLICATION OF WOUND VAC;  Surgeon: Myrene Galas, MD;  Location: [facility] OR;  Service: Orthopedics;  Laterality: Left;  . EXTERNAL FIXATION LEG Left 10/05/2015   Procedure: EXTERNAL FIXATION LEG LEFT ANKLE & LEFT LOWER LEG;  Surgeon: Cammy Copa, MD;  Location: [facility] OR;  Service: Orthopedics;  Laterality: Left;  . EXTERNAL FIXATION REMOVAL Left 12/04/2015   Procedure: REMOVAL EXTERNAL FIXATION LEFT LEG;  Surgeon: Myrene Galas, MD;  Location: [facility] OR;  Service: Orthopedics;  Laterality: Left;  . I&D EXTREMITY Left 10/05/2015   Procedure: IRRIGATION AND DEBRIDEMENT EXTREMITY LEFT ANKLE & LEFT  LOWER LEG,PLACEMENT OF ANTIBIOTIC BEADS;  Surgeon: Cammy Copa, MD;  Location: [facility] OR;  Service: Orthopedics;  Laterality: Left;  . I&D EXTREMITY Left 10/09/2015   Procedure: IRRIGATION AND DEBRIDEMENT LEFT ANKLE POSSIBLE EX-FIX ADJUSTMENT;  Surgeon: Myrene Galas, MD;  Location[facility] OR;  Service: Orthopedics;  Laterality: Left;  . I&D EXTREMITY Left 07/27/2016   Procedure: IRRIGATION AND DEBRIDEMENT EXTREMITY;  Surgeon: Sheral Apley, MD;  Location: [facility] OR;  Service:  Orthopedics;  Laterality: Left;  . LEG SURGERY     "rod in my right leg"  . MANDIBLE FRACTURE SURGERY    . NO PAST SURGERIES    . SKIN SPLIT GRAFT Left 12/04/2015   Procedure: SKIN GRAFT SPLIT THICKNESS LEFT LEG;  Surgeon: Myrene Galas, MD;  Location: [facility] OR;  Service: Orthopedics;  Laterality: Left;  . TIBIA IM NAIL INSERTION Right 07/31/2012   Procedure: INTRAMEDULLARY (IM) NAIL TIBIAL;  Surgeon: Nadara Mustard, MD;  Location: [facility] OR;  Service: Orthopedics;  Laterality: Right;  Intramedullary Nail right tib/fib   Social History   Occupational History  . Not on file  Tobacco Use  . Smoking status: Current Every Day Smoker    Packs/day: 0.50    Types: Cigarettes  . Smokeless tobacco: Never Used  Substance and Sexual Activity  . Alcohol use: Not Currently  . Drug use: No  .  Sexual activity: Not on file

## 2017-08-29 ENCOUNTER — Other Ambulatory Visit: Payer: Self-pay

## 2017-08-29 ENCOUNTER — Emergency Department (HOSPITAL_COMMUNITY)
Admission: EM | Admit: 2017-08-29 | Discharge: 2017-08-29 | Disposition: A | Discharge status: Home / Self Care | Payer: Medicaid Other | Attending: Emergency Medicine | Admitting: Emergency Medicine

## 2017-08-29 ENCOUNTER — Encounter (HOSPITAL_COMMUNITY): Payer: Self-pay | Admitting: Emergency Medicine

## 2017-08-29 ENCOUNTER — Emergency Department (HOSPITAL_COMMUNITY): Payer: Medicaid Other

## 2017-08-29 DIAGNOSIS — Z79899 Other long term (current) drug therapy: Secondary | ICD-10-CM | POA: Insufficient documentation

## 2017-08-29 DIAGNOSIS — Y939 Activity, unspecified: Secondary | ICD-10-CM | POA: Insufficient documentation

## 2017-08-29 DIAGNOSIS — I10 Essential (primary) hypertension: Secondary | ICD-10-CM | POA: Diagnosis not present

## 2017-08-29 DIAGNOSIS — Y929 Unspecified place or not applicable: Secondary | ICD-10-CM | POA: Diagnosis not present

## 2017-08-29 DIAGNOSIS — S0990XA Unspecified injury of head, initial encounter: Secondary | ICD-10-CM | POA: Diagnosis present

## 2017-08-29 DIAGNOSIS — S01511A Laceration without foreign body of lip, initial encounter: Secondary | ICD-10-CM | POA: Insufficient documentation

## 2017-08-29 DIAGNOSIS — F17210 Nicotine dependence, cigarettes, uncomplicated: Secondary | ICD-10-CM | POA: Diagnosis not present

## 2017-08-29 DIAGNOSIS — S0081XA Abrasion of other part of head, initial encounter: Secondary | ICD-10-CM

## 2017-08-29 DIAGNOSIS — Y999 Unspecified external cause status: Secondary | ICD-10-CM | POA: Insufficient documentation

## 2017-08-29 DIAGNOSIS — W19XXXA Unspecified fall, initial encounter: Secondary | ICD-10-CM | POA: Insufficient documentation

## 2017-08-29 DIAGNOSIS — S80211A Abrasion, right knee, initial encounter: Secondary | ICD-10-CM | POA: Insufficient documentation

## 2017-08-29 MED ORDER — LORAZEPAM 2 MG/ML IJ SOLN
1.0000 mg | Freq: Once | INTRAMUSCULAR | Status: AC
  Administered 2017-08-29: 1 mg via INTRAVENOUS
  Filled 2017-08-29: qty 1

## 2017-08-29 MED ORDER — IBUPROFEN 400 MG PO TABS
400.0000 mg | ORAL_TABLET | Freq: Once | ORAL | Status: AC
  Administered 2017-08-29: 400 mg via ORAL
  Filled 2017-08-29: qty 1

## 2017-08-29 MED ORDER — LIDOCAINE-EPINEPHRINE-TETRACAINE (LET) SOLUTION
3.0000 mL | Freq: Once | NASAL | Status: AC
  Administered 2017-08-29: 3 mL via TOPICAL
  Filled 2017-08-29: qty 3

## 2017-08-29 MED ORDER — PENICILLIN V POTASSIUM 250 MG PO TABS: 250.0000 mg | ORAL_TABLET | Freq: Four times a day (QID) | ORAL | 0 refills | Status: DC

## 2017-08-29 MED ORDER — BACITRACIN ZINC 500 UNIT/GM EX OINT
TOPICAL_OINTMENT | CUTANEOUS | Status: AC
  Filled 2017-08-29: qty 0.9

## 2017-08-29 NOTE — ED Triage Notes (Signed)
Pt has new left sided amputation and was walking down outside steps and fell on his face.  Pt is very intoxicated and crying uncontrollably.

## 2017-08-29 NOTE — ED Notes (Signed)
All wounds cleaned with sur-cleanse and dry dressing applied to left facial abrasion and right knee abrasion. Lip laceration cleaned with iodine swab and to be sutured by provider.

## 2017-08-29 NOTE — ED Notes (Signed)
Family called to come pick up pt 

## 2017-08-29 NOTE — ED Notes (Signed)
Pt refused for NT to clean up abrasions. RN notified. Pt given water to swish around and spit to clear the blood from his mouth.

## 2017-08-29 NOTE — ED Provider Notes (Signed)
[facility] EMERGENCY DEPARTMENT Provider Note   CSN: [id number] Arrival date & time: 08/29/17  1308     History   Chief Complaint Chief Complaint  Patient presents with  . Fall    HPI Damon Alexander is a 32 y.o. male.  The history is provided by the EMS personnel, the patient, a relative and a parent. The history is limited by the condition of the patient (uncooperative).  Fall     Pt was seen at 0955. Per pt and his family, c/o sudden onset and resolution of one episode of fall that occurred today PTA. Pt's family states pt has hx etoh abuse, new left BKA, fell forward onto his hands, right knee and left face. Denies falling on left BKA.  Pt crying on arrival to ED.    Td UTD Past Medical History:  Diagnosis Date  . 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 Displaced pilon fracture of left tibia, subsequent encounter for open fracture type IIIA, IIIB, or IIIC with malunion 03/06/2016  . HTN (hypertension) 12/05/2015   pt denies this  . Medical history non-contributory   . Nicotine dependence 10/12/2015  . 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  . Type III open fracture of left tibial plafond with involvement of fibula 10/12/2015    Patient Active Problem List   Diagnosis Date Noted  . History of left below knee amputation (HCC) 08/12/2017  . Osteomyelitis (HCC) 08/03/2017  . MRSA bacteremia 08/03/2017  . 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 Left leg swelling 07/30/2017  . Abscess of left leg 07/28/2016  . Left leg cellulitis 07/26/2016  . Hypokalemia 07/26/2016  . 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 Displaced pilon fracture of left tibia, subsequent encounter for open fracture type IIIA, IIIB, or IIIC with malunion 03/06/2016  . 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 HTN (hypertension) 12/05/2015  . 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 Open fracture of tibial  plafond 12/04/2015  . Type III open fracture of left tibial plafond with involvement of fibula 10/12/2015  . Alcohol abuse 10/12/2015  . Nicotine dependence 10/12/2015    Past Surgical History:  Procedure Laterality Date  . AMPUTATION Left 08/02/2017   Procedure: AMPUTATION BELOW KNEE;  Surgeon: Nadara Mustard, MD;  Location: [facility] OR;  Service: Orthopedics;  Laterality: Left;  . ANKLE FUSION Left 02/12/2016   Procedure: ARTHRODESIS ANKLE;  Surgeon: Myrene Galas, MD;  Location: [facility] OR;  Service: Orthopedics;  Laterality: Left;  . APPLICATION OF WOUND VAC Left 10/05/2015   Procedure: APPLICATION OF WOUND VAC;  Surgeon: Cammy Copa, MD;  Location: [facility] OR;  Service: Orthopedics;  Laterality: Left;  . APPLICATION OF WOUND VAC Left 10/09/2015   Procedure: APPLICATION OF WOUND VAC;  Surgeon: Myrene Galas, MD;  Location: [facility] OR;  Service: Orthopedics;  Laterality: Left;  . EXTERNAL FIXATION LEG Left 10/05/2015   Procedure: EXTERNAL FIXATION LEG LEFT ANKLE & LEFT LOWER LEG;  Surgeon: Cammy Copa, MD;  Location: [facility] OR;  Service: Orthopedics;  Laterality: Left;  . EXTERNAL FIXATION REMOVAL Left 12/04/2015   Procedure: REMOVAL EXTERNAL FIXATION LEFT LEG;  Surgeon: Myrene Galas, MD;  Location: [facility] OR;  Service: Orthopedics;  Laterality: Left;  . I&D EXTREMITY Left 10/05/2015   Procedure: IRRIGATION AND DEBRIDEMENT EXTREMITY LEFT ANKLE & LEFT  LOWER LEG,PLACEMENT OF ANTIBIOTIC BEADS;  Surgeon: Cammy Copa, MD;  Location: [facility] OR;  Service: Orthopedics;  Laterality: Left;  . I&D EXTREMITY Left 10/09/2015   Procedure: IRRIGATION AND DEBRIDEMENT LEFT ANKLE POSSIBLE EX-FIX ADJUSTMENT;  Surgeon: Myrene GalasMichael Handy, MD;  [facility];  Service: Orthopedics;  Laterality: Left;  . I&D EXTREMITY Left 07/27/2016   Procedure: IRRIGATION AND DEBRIDEMENT EXTREMITY;  Surgeon: Sheral ApleyMurphy, Timothy D, MD;  [facility];  Service: Orthopedics;  Laterality: Left;  . LEG SURGERY     "rod in my right leg"  . MANDIBLE  FRACTURE SURGERY    . NO PAST SURGERIES    . SKIN SPLIT GRAFT Left 12/04/2015   Procedure: SKIN GRAFT SPLIT THICKNESS LEFT LEG;  Surgeon: Myrene GalasMichael Handy, MD;  [facility];  Service: Orthopedics;  Laterality: Left;  . TIBIA IM NAIL INSERTION Right 07/31/2012   Procedure: INTRAMEDULLARY (IM) NAIL TIBIAL;  Surgeon: Nadara MustardMarcus V Duda, MD;  Location: [facility] OR;  Service: Orthopedics;  Laterality: Right;  Intramedullary Nail right tib/fib        Home Medications    Prior to Admission medications   Medication Sig Start Date End Date Taking? Authorizing Provider  folic acid (FOLVITE) 1 MG tablet Take 1 tablet (1 mg total) by mouth daily. 08/05/17  Yes Albertine GratesXu, Fang, MD  ibuprofen (ADVIL,MOTRIN) 600 MG tablet Take 1 tablet (600 mg total) by mouth every 8 (eight) hours as needed. For pain. Can take with percocet for added pain relief. 08/24/17  Yes Mike Gipouglas, Andre, FNP  methocarbamol (ROBAXIN) 500 MG tablet Take 1 tablet (500 mg total) by mouth every 6 (six) hours as needed for muscle spasms. 08/24/17  Yes Mike Gipouglas, Andre, FNP  oxyCODONE-acetaminophen (PERCOCET/ROXICET) 5-325 MG tablet Take 1 tablet by mouth every 6 (six) hours as needed for up to 7 days for severe pain. 08/24/17 08/31/17 Yes Mike Gipouglas, Andre, FNP  polyethylene glycol [facility](MIRALAX / Ethelene HalGLYCOLAX) packet Take 17 g by mouth daily as needed for mild constipation. 08/04/17  Yes Albertine GratesXu, Fang, MD  thiamine 100 MG tablet Take 1 tablet (100 mg total) by mouth daily. 08/05/17  Yes Albertine GratesXu, Fang, MD    Family History Family History  Problem Relation Age of Onset  . Hypertension Mother     Social History Social History   Tobacco Use  . Smoking status: Current Every Day Smoker    Packs/day: 0.50    Types: Cigarettes  . Smokeless tobacco: Never Used  Substance Use Topics  . Alcohol use: Yes  . Drug use: No     Allergies   Ciprofloxacin   Review of Systems Review of Systems  Unable to perform ROS: Other     Physical Exam Updated Vital Signs BP 108/69   Pulse  94   Temp 98.1 F (36.7 C) (Oral)   Resp 16   Ht 5\' 10"  (1.778 m)   Wt 105.2 kg (232 lb)   SpO2 95%   BMI 33.29 kg/m     Patient Vitals for the past 24 hrs:  BP Temp Temp src Pulse Resp SpO2 Height Weight  08/29/17 1330 108/69 - - 94 16 95 % - -  08/29/17 1220 108/69 98.1 F (36.7 C) Oral 97 16 94 % - -  08/29/17 1030 (!) 114/56 - - 98 18 97 % - -  08/29/17 0949 (!) 151/95 98.2 F (36.8 C) Oral (!) 128 18 97 % - -  08/29/17 0934 - - - - - - 5\' 10"  (1.778 m) 105.2 kg (232 lb)     Physical Exam 1000: Physical examination: Vital signs  and O2 SAT: Reviewed; Constitutional: Well developed, Well nourished, Well hydrated, In no acute distress; Head and Face: Normocephalic, No scalp hematomas, no lacs. +superficial abrasions left forehead, cheek. Non-tender to palp superior and inferior orbital rim areas.  No zygoma tenderness.  No mandibular tenderness.; Eyes: EOMI, PERRL, No scleral icterus; ENMT: Mouth and pharynx normal, Left TM normal, Right TM normal, Mucous membranes moist, +teeth and tongue intact.  No intraoral or intranasal bleeding. No septal hematomas.  No trismus, no malocclusion. +mid-lower with small lac and superficial abrasion to outer mid-lower lip and localized edema. No palp or visualized FB, not through-through.;  Neck: Supple, Trachea midline; Spine: No midline CS, TS, LS tenderness.; Cardiovascular: Regular rate and rhythm, No gallop; Respiratory: Breath sounds clear & equal bilaterally, No wheezes, Normal respiratory effort/excursion; Chest: Nontender, No deformity, Movement normal, No crepitus, No abrasions or ecchymosis.; Abdomen: Soft, Nontender, Nondistended, Normal bowel sounds, No abrasions or ecchymosis.; Genitourinary: No CVA tenderness;; Extremities: Full range of motion major/large joints of bilat UE's and LE's without pain or tenderness to palp, Neurovascularly intact, Pulses normal, No deformity. Pelvis stable. No edema. No tenderness bilat  shoulders/elbows/wrists/hands/fingers.  No bilat snuffbox tenderness.  No pain to axial thumb or 3rd MCP loading.  Bilateral forearm compartments soft, strong radial pp, brisk cap refill in fingers. Bilateral hands NMS intact. Generalized TTP bilat palms without specific area of point tenderness. No open wounds to hands or wrists, no deformity, no abrasions, no ecchymosis.  +left BKA NT to palp, no wounds. +FROM right knee, including able to lift extended RLE off stretcher, and extend right lower leg against resistance.  No ligamentous laxity.  No patellar or quad tendon step-offs.  NMS intact right foot, strong pedal pp. +plantarflexion of right foot w/calf squeeze.  No palpable gap right Achilles's tendon.  No proximal fibular head tenderness. +superficial abrasion to right patellar area, otherwise no edema, erythema, warmth, ecchymosis or deformity.  No specific area of point tenderness to right knee/tib-fib/ankle. ; Neuro: AA&Ox3, GCS 15.  Major CN grossly intact. Speech clear. No gross focal motor or sensory deficits in extremities.; Skin: Color normal, Warm, Dry   ED Treatments / Results  Labs (all labs ordered are listed, but only abnormal results are displayed)   EKG None  Radiology   Procedures Procedures (including critical care time)  Medications Ordered in ED Medications  bacitracin 500 UNIT/GM ointment (has no administration in time range)  ibuprofen (ADVIL,MOTRIN) tablet 400 mg (has no administration in time range)  LORazepam (ATIVAN) injection 1 mg (1 mg Intravenous Given 08/29/17 1022)  lidocaine-EPINEPHrine-tetracaine (LET) solution (3 mLs Topical Given 08/29/17 1342)     Initial Impression / Assessment and Plan / ED Course  I have reviewed the triage vital signs and the nursing notes.  Pertinent labs & imaging results that were available during my care of the patient were reviewed by me and considered in my medical decision making (see chart for  details).  MDM Reviewed: previous chart, nursing note and vitals Interpretation: x-ray and CT scan    Dg Elbow Complete Left Result Date: 08/29/2017 CLINICAL DATA:  Acute LEFT elbow pain following fall today. Initial encounter. EXAM: LEFT ELBOW - COMPLETE 3+ VIEW COMPARISON:  None. FINDINGS: There is no evidence of fracture, dislocation, or joint effusion. There is no evidence of arthropathy or other focal bone abnormality. Soft tissues are unremarkable. IMPRESSION: Negative. Electronically Signed   By: Harmon Pier M.D.   On: 08/29/2017 11:50   Dg Knee 1-2 Views Right  Result Date: 08/29/2017 CLINICAL DATA:  Acute RIGHT knee pain following fall today. Initial encounter. EXAM: RIGHT KNEE - 1-2 VIEW COMPARISON:  12/02/2014 FINDINGS: No acute fracture, subluxation or dislocation. No joint effusion. Intramedullary rod within the proximal tibia and remote proximal fibular fracture again noted. IMPRESSION: No acute abnormality. Electronically Signed   By: Harmon Pier M.D.   On: 08/29/2017 11:41   Dg Tibia/fibula Right Result Date: 08/29/2017 CLINICAL DATA:  Acute RIGHT LOWER leg pain following fall today. Initial encounter. EXAM: RIGHT TIBIA AND FIBULA - 2 VIEW COMPARISON:  12/02/2014 FINDINGS: No acute fracture, subluxation or dislocation. Remote fractures of the proximal fibula and mid-distal tibia noted. Tibial nail and proximal screw again noted. IMPRESSION: No acute abnormality. Electronically Signed   By: Harmon Pier M.D.   On: 08/29/2017 11:43   Dg Ankle Complete Right Result Date: 08/29/2017 CLINICAL DATA:  Acute RIGHT ankle pain following fall today. Initial encounter. EXAM: RIGHT ANKLE - COMPLETE 3+ VIEW COMPARISON:  None. FINDINGS: No acute fracture, subluxation or dislocation. Remote fracture of the distal tibia and intramedullary rod again noted. No evidence of soft tissue swelling. IMPRESSION: No acute bony abnormality. Electronically Signed   By: Harmon Pier M.D.   On: 08/29/2017 11:43    Ct Head Wo Contrast Result Date: 08/29/2017 CLINICAL DATA:  Fall. Trauma to face. Initial encounter. Recent below the knee amputation. EXAM: CT HEAD WITHOUT CONTRAST CT MAXILLOFACIAL WITHOUT CONTRAST CT CERVICAL SPINE WITHOUT CONTRAST TECHNIQUE: Multidetector CT imaging of the head, cervical spine, and maxillofacial structures were performed using the standard protocol without intravenous contrast. Multiplanar CT image reconstructions of the cervical spine and maxillofacial structures were also generated. COMPARISON:  CT head and cervical spine 12/02/2014. FINDINGS: CT HEAD FINDINGS Brain: No acute infarct, hemorrhage, or mass lesion is present. The ventricles are of normal size. No significant extraaxial fluid collection is present. No significant white matter disease is present. The brainstem and cerebellum are normal. Vascular: No hyperdense vessel or unexpected calcification. Skull: Prominent temporalis muscles are present. There is some asymmetric edema involving the right temporal musculature and scalp compared to the left. There is no underlying fracture. No radiopaque foreign body is present. CT MAXILLOFACIAL FINDINGS Osseous: No acute or healing fractures are present. Left facial soft tissue swelling extends to the periorbital structures. There is no underlying fracture. The mandible is intact and located. Zygomatic arch is intact. Nasal bones are intact. Orbits: Left periorbital soft tissue swelling is present. Globes and orbits are otherwise within normal limits. Sinuses: Minimal mucosal thickening is present in the left maxillary sinus. A remote right orbital blowout fracture is present. Paranasal sinuses and mastoid air cells are otherwise clear. Soft tissues: Left facial soft tissue swelling is present. No radiopaque foreign body is noted. Alignment: AP alignment is anatomic. There straightening of the normal lumbar lordosis. Skull base and vertebrae: Skull base is within normal limits.  Craniocervical junction is unremarkable. Vertebral body heights are maintained. No acute or healing fractures are present. Soft tissues and spinal canal: Paraspinous soft tissues are unremarkable. Spinal canal is within normal limits. Disc levels: No focal stenosis is evident. Uncovertebral spurring is most prominent at C4-5 on the right. Upper chest: The lung apices are clear. Thoracic inlet is within normal limits. IMPRESSION: 1. Normal CT appearance of the brain. 2. Left facial soft tissue swelling without underlying fracture. 3. Minimal degenerative changes at C4-5 without acute fracture or traumatic subluxation. Electronically Signed   By: Marin Roberts M.D.   On: 08/29/2017  11:04   Ct Cervical Spine Wo Contrast Result Date: 08/29/2017 CLINICAL DATA:  Fall. Trauma to face. Initial encounter. Recent below the knee amputation. EXAM: CT HEAD WITHOUT CONTRAST CT MAXILLOFACIAL WITHOUT CONTRAST CT CERVICAL SPINE WITHOUT CONTRAST TECHNIQUE: Multidetector CT imaging of the head, cervical spine, and maxillofacial structures were performed using the standard protocol without intravenous contrast. Multiplanar CT image reconstructions of the cervical spine and maxillofacial structures were also generated. COMPARISON:  CT head and cervical spine 12/02/2014. FINDINGS: CT HEAD FINDINGS Brain: No acute infarct, hemorrhage, or mass lesion is present. The ventricles are of normal size. No significant extraaxial fluid collection is present. No significant white matter disease is present. The brainstem and cerebellum are normal. Vascular: No hyperdense vessel or unexpected calcification. Skull: Prominent temporalis muscles are present. There is some asymmetric edema involving the right temporal musculature and scalp compared to the left. There is no underlying fracture. No radiopaque foreign body is present. CT MAXILLOFACIAL FINDINGS Osseous: No acute or healing fractures are present. Left facial soft tissue swelling  extends to the periorbital structures. There is no underlying fracture. The mandible is intact and located. Zygomatic arch is intact. Nasal bones are intact. Orbits: Left periorbital soft tissue swelling is present. Globes and orbits are otherwise within normal limits. Sinuses: Minimal mucosal thickening is present in the left maxillary sinus. A remote right orbital blowout fracture is present. Paranasal sinuses and mastoid air cells are otherwise clear. Soft tissues: Left facial soft tissue swelling is present. No radiopaque foreign body is noted. Alignment: AP alignment is anatomic. There straightening of the normal lumbar lordosis. Skull base and vertebrae: Skull base is within normal limits. Craniocervical junction is unremarkable. Vertebral body heights are maintained. No acute or healing fractures are present. Soft tissues and spinal canal: Paraspinous soft tissues are unremarkable. Spinal canal is within normal limits. Disc levels: No focal stenosis is evident. Uncovertebral spurring is most prominent at C4-5 on the right. Upper chest: The lung apices are clear. Thoracic inlet is within normal limits. IMPRESSION: 1. Normal CT appearance of the brain. 2. Left facial soft tissue swelling without underlying fracture. 3. Minimal degenerative changes at C4-5 without acute fracture or traumatic subluxation. Electronically Signed   By: Marin Roberts M.D.   On: 08/29/2017 11:04    Dg Shoulder Left Result Date: 08/29/2017 CLINICAL DATA:  Acute LEFT shoulder pain following fall today. Initial encounter. EXAM: LEFT SHOULDER - 2+ VIEW COMPARISON:  11/26/2014 FINDINGS: There is no evidence of fracture or dislocation. There is no evidence of arthropathy or other focal bone abnormality. Soft tissues are unremarkable. IMPRESSION: Negative. Electronically Signed   By: Harmon Pier M.D.   On: 08/29/2017 11:50   Dg Hand Complete Left Result Date: 08/29/2017 CLINICAL DATA:  Acute LEFT hand pain following fall today.  Initial encounter. EXAM: LEFT HAND - COMPLETE 3+ VIEW COMPARISON:  None. FINDINGS: There is no evidence of acute fracture or dislocation. There is no evidence of arthropathy or other focal bone abnormality. Soft tissues are unremarkable. IMPRESSION: Negative. Electronically Signed   By: Harmon Pier M.D.   On: 08/29/2017 11:47    Dg Hand Complete Right Result Date: 08/29/2017 CLINICAL DATA:  Acute RIGHT hand pain following fall today. Initial encounter. EXAM: RIGHT HAND - COMPLETE 3+ VIEW COMPARISON:  11/06/2016 radiographs FINDINGS: No acute fracture, subluxation or dislocation. Remote fractures of the 4th and 5th metacarpals again noted. No radiopaque foreign bodies or suspicious bony lesions. IMPRESSION: No acute bony abnormality. Electronically Signed   By:  Harmon Pier M.D.   On: 08/29/2017 11:46    Ct Maxillofacial Wo Cm Result Date: 08/29/2017 CLINICAL DATA:  Fall. Trauma to face. Initial encounter. Recent below the knee amputation. EXAM: CT HEAD WITHOUT CONTRAST CT MAXILLOFACIAL WITHOUT CONTRAST CT CERVICAL SPINE WITHOUT CONTRAST TECHNIQUE: Multidetector CT imaging of the head, cervical spine, and maxillofacial structures were performed using the standard protocol without intravenous contrast. Multiplanar CT image reconstructions of the cervical spine and maxillofacial structures were also generated. COMPARISON:  CT head and cervical spine 12/02/2014. FINDINGS: CT HEAD FINDINGS Brain: No acute infarct, hemorrhage, or mass lesion is present. The ventricles are of normal size. No significant extraaxial fluid collection is present. No significant white matter disease is present. The brainstem and cerebellum are normal. Vascular: No hyperdense vessel or unexpected calcification. Skull: Prominent temporalis muscles are present. There is some asymmetric edema involving the right temporal musculature and scalp compared to the left. There is no underlying fracture. No radiopaque foreign body is present. CT  MAXILLOFACIAL FINDINGS Osseous: No acute or healing fractures are present. Left facial soft tissue swelling extends to the periorbital structures. There is no underlying fracture. The mandible is intact and located. Zygomatic arch is intact. Nasal bones are intact. Orbits: Left periorbital soft tissue swelling is present. Globes and orbits are otherwise within normal limits. Sinuses: Minimal mucosal thickening is present in the left maxillary sinus. A remote right orbital blowout fracture is present. Paranasal sinuses and mastoid air cells are otherwise clear. Soft tissues: Left facial soft tissue swelling is present. No radiopaque foreign body is noted. Alignment: AP alignment is anatomic. There straightening of the normal lumbar lordosis. Skull base and vertebrae: Skull base is within normal limits. Craniocervical junction is unremarkable. Vertebral body heights are maintained. No acute or healing fractures are present. Soft tissues and spinal canal: Paraspinous soft tissues are unremarkable. Spinal canal is within normal limits. Disc levels: No focal stenosis is evident. Uncovertebral spurring is most prominent at C4-5 on the right. Upper chest: The lung apices are clear. Thoracic inlet is within normal limits. IMPRESSION: 1. Normal CT appearance of the brain. 2. Left facial soft tissue swelling without underlying fracture. 3. Minimal degenerative changes at C4-5 without acute fracture or traumatic subluxation. Electronically Signed   By: Marin Roberts M.D.   On: 08/29/2017 11:04    1445:  Pt hyperventilating, crying and pushing examiner's hand away from him during exam where ever he is examined. Family did attempt to calm pt with transient success, but pt returned to this behavior. IV Ativan given shortly after arrival to facilitate exam and lip suturing with good effect. Pt has slept for short time after IV ativan. XR/CT obtained and are reassuring. Abd remains benign, resps easy. Pt states he "needs  some pain medicines" and is ready to go home. Family states pt is in a Pain Clinic and is being closely monitored for narcotics outside his prescriptions.  Will dose motrin. Td already UTD per family. Dx and testing d/w pt and family.  Questions answered.  Verb understanding, agreeable to d/c home with outpt f/u.    Final Clinical Impressions(s) / ED Diagnoses   Final diagnoses:  Fall, initial encounter  Lip laceration, initial encounter  Abrasion of face, initial encounter  Abrasion of right knee, initial encounter    ED Discharge Orders    None       Seena Jester, DO 09/02/17 1233

## 2017-08-29 NOTE — ED Provider Notes (Signed)
I was asked to suture lip laceration.  This was the extent of my involvement with this patient.  LACERATION REPAIR Performed by: Burgess AmorIDOL, Jhene Westmoreland Authorized by: Burgess AmorIDOL, [place] Consent: Verbal consent obtained. Risks and benefits: risks, benefits and alternatives were discussed Consent given by: patient Patient identity confirmed: provided demographic data Prepped and Draped in normal sterile fashion Wound explored  Laceration Location: lower lip mucosa  Laceration Length: 1cm  No Foreign Bodies seen or palpated  Anesthesia: topical LET  Local anesthetic: topical LET  Anesthetic total: 3 ml  Irrigation method: syringe Amount of cleaning: standard  Skin closure: vicryl rapide 5-0  Number of sutures: 3  Technique: simple interrupted.  Patient tolerance: Patient tolerated the procedure well with no immediate complications.    Burgess Amordol, Danish Ruffins, PA-C 08/29/17 1437    Greig JesterMcManus, Kathleen, DO 09/02/17 1234

## 2017-08-29 NOTE — ED Notes (Signed)
Pt hyperventilating stating that he cant breath. Pt reassured that oxygen sats are normal and reminded him to take slow deep breaths. MD at bedside for eval.

## 2017-08-29 NOTE — ED Notes (Signed)
Contact  Damon Alexander if needed @ [phone] Damon Alexander @ [phone]

## 2017-08-29 NOTE — Discharge Instructions (Signed)
Take the prescription as directed.  Apply moist heat or ice to the area(s) of discomfort, for 15 minutes at a time, several times per day for the next few days.  Do not fall asleep on a heating or ice pack. Wash the abraded area(s) gently with soap and water, and pat dry, at least twice a day, and cover with a clean/dry dressing.  Change the dressing whenever it becomes wet or soiled after washing the area with soap and water and patting dry. Your sutures will dissolve; do not pick or chew on them.  Eat/drink liquids and eat soft foods for a few days. Rinse your mouth with a warm, salt-water rinse 4-6 times per day. Call your regular medical doctor on Monday to schedule a follow up appointment for a recheck within the next 3 days.  Return to the Emergency Department immediately if worsening.

## 2017-08-29 NOTE — ED Notes (Signed)
Patient transported to X-ray & CT dept

## 2017-08-31 LAB — 737588 9+OXYCODONE+CRT-UNBUND
Amphetamine Scrn, Ur: NEGATIVE ng/mL
BARBITURATE SCREEN URINE: NEGATIVE ng/mL
CANNABINOIDS UR QL SCN: NEGATIVE ng/mL
Cocaine (Metab) Scrn, Ur: NEGATIVE ng/mL
Creatinine(Crt), U: 139.8 mg/dL (ref 20.0–300.0)
Methadone Screen, Urine: NEGATIVE ng/mL
Opiate Scrn, Ur: NEGATIVE ng/mL
Ph of Urine: 6 (ref 4.5–8.9)
Phencyclidine Qn, Ur: NEGATIVE ng/mL
Propoxyphene Scrn, Ur: NEGATIVE ng/mL

## 2017-08-31 LAB — BENZODIAZEPINES CONFIRM, URINE
ALPRAZOLAM: NEGATIVE
BENZODIAZEPINES: POSITIVE ng/mL — AB
CLONAZEPAM: NEGATIVE
Flurazepam: NEGATIVE
LORAZEPAM: NEGATIVE
MIDAZOLAM: NEGATIVE
NORDIAZEPAM: NEGATIVE
OXAZEPAM GC/MS CONF: 207 ng/mL
Oxazepam: POSITIVE — AB
TEMAZEPAM: NEGATIVE
TRIAZOLAM: NEGATIVE

## 2017-08-31 LAB — OXYCODONE/OXYMORPHONE CONFIRM
OXYCODONE/OXYMORPH: POSITIVE — AB
OXYCODONE: NEGATIVE
OXYMORPHONE CONFIRM: 249 ng/mL
OXYMORPHONE: POSITIVE — AB

## 2017-09-01 ENCOUNTER — Ambulatory Visit (HOSPITAL_COMMUNITY): Payer: Medicaid Other

## 2017-09-01 ENCOUNTER — Encounter (HOSPITAL_COMMUNITY): Payer: Self-pay

## 2017-09-01 DIAGNOSIS — Z89512 Acquired absence of left leg below knee: Secondary | ICD-10-CM | POA: Diagnosis not present

## 2017-09-01 DIAGNOSIS — M6281 Muscle weakness (generalized): Secondary | ICD-10-CM

## 2017-09-01 DIAGNOSIS — R2689 Other abnormalities of gait and mobility: Secondary | ICD-10-CM
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
## 2017-09-03 ENCOUNTER — Ambulatory Visit (HOSPITAL_COMMUNITY): Payer: Self-pay

## 2017-09-07 ENCOUNTER — Telehealth: Payer: Self-pay

## 2017-09-07 ENCOUNTER — Other Ambulatory Visit: Payer: Self-pay | Admitting: Family Medicine

## 2017-09-07 MED ORDER — OXYCODONE-ACETAMINOPHEN 5-325 MG PO TABS: 1.0000 | ORAL_TABLET | Freq: Four times a day (QID) | ORAL | 0 refills | Status: AC | PRN

## 2017-09-07 NOTE — Progress Notes (Signed)
Will send in 7 days. Patient will need to return for UDS. Benzos present in system prior to hospital visit.

## 2017-09-08 ENCOUNTER — Other Ambulatory Visit: Payer: Self-pay | Admitting: Family Medicine

## 2017-09-08 ENCOUNTER — Encounter (HOSPITAL_COMMUNITY): Payer: Self-pay

## 2017-09-08 NOTE — Telephone Encounter (Signed)
Damon Alexander,  Please advise on refill request. Thanks!

## 2017-09-09 NOTE — Telephone Encounter (Signed)
Refilled on 09/07/2017 for 7 days

## 2017-09-10 ENCOUNTER — Encounter (HOSPITAL_COMMUNITY): Payer: Self-pay

## 2017-09-11 ENCOUNTER — Telehealth: Payer: Self-pay

## 2017-09-14 ENCOUNTER — Other Ambulatory Visit: Payer: Self-pay | Admitting: Internal Medicine

## 2017-09-14 NOTE — Telephone Encounter (Signed)
Please ask which pharmacy patient prefers. Prescription was sent to the pharmacy on record [facility]([place]).

## 2017-09-15 ENCOUNTER — Encounter (HOSPITAL_COMMUNITY): Payer: Self-pay

## 2017-09-17 ENCOUNTER — Encounter (HOSPITAL_COMMUNITY): Payer: Self-pay

## 2017-09-17 ENCOUNTER — Ambulatory Visit (INDEPENDENT_AMBULATORY_CARE_PROVIDER_SITE_OTHER): Payer: Self-pay | Admitting: Orthopedic Surgery

## 2017-09-24 ENCOUNTER — Ambulatory Visit: Payer: Self-pay | Admitting: Family Medicine

## 2017-11-03 ENCOUNTER — Encounter (HOSPITAL_COMMUNITY): Payer: Self-pay

## 2017-11-03 NOTE — Therapy (Signed)
[facility] [address] [place], [place], [address] Phone: [phone]   Fax:  [phone]  Patient Details  Name: Damon Alexander MRN: [id number] Date of Birth: [birth year] Referring Provider:  No ref. provider found  Encounter Date: 11/03/2017   PHYSICAL THERAPY DISCHARGE SUMMARY  Visits from Start of Care: 2  Current functional level related to goals / functional outcomes: Patient is being discharged as he never returned for therapy after his last appointment. He reported being unable to be self-pay and was still waiting on his medicaid approval at that time.    Remaining deficits: See last treatment note and evaluation.   Education / Equipment: Education provided at last session: PT Education - 09/01/17 1551    Education Details  Reviewed goals, assured compliance wiht HEP and copy of eval given to pt.  Discussed Medicaid status and self pay until authorizated, pt wishes to wait until approved before return to session.  Educated on scar mobilization and desensitization for reduction of scar tissue/adhesions in residual limb.      Plan: Patient agrees to discharge.  Patient goals were not met. Patient is being discharged due to not returning since the last visit.  ?????      Kipp Brood, PT, DPT Physical Therapist with [facility]  11/03/2017 12:44 PM    [place], [place], [address] Phone: [phone]   Fax:  [phone]

## 2018-01-20 ENCOUNTER — Telehealth (INDEPENDENT_AMBULATORY_CARE_PROVIDER_SITE_OTHER): Payer: Self-pay | Admitting: Orthopedic Surgery

## 2018-01-20 NOTE — Telephone Encounter (Signed)
I called and spoke to patient about his paperwork for the attorney which was filled out by Dr Lajoyce Cornersuda and required payment for the paperwork. Patient mother stated they told her she didn't have to pay but I told her that Dr Lajoyce Cornersuda specified that he does require payment for all paperwork that needs signed by attorneys. Patient was transferred to medical records.

## 2018-01-20 NOTE — Telephone Encounter (Signed)
Patient's mother Harriett Sine(Nancy) called asked for a call back concerning the letter that was left for Dr Lajoyce Cornersuda last week to complete and fax back to the attorneys office.  I spoke with the patient and he said it's ok to speak with his mother. Harriett Sineancy said the attorneys office called her yesterday and said the letter was not received yet.  Harriett Sineancy said the case will only stay open for 4 weeks. She said it's been two weeks so far. The number to contact Harriett Sineancy is [phone]

## 2018-04-29 ENCOUNTER — Other Ambulatory Visit (INDEPENDENT_AMBULATORY_CARE_PROVIDER_SITE_OTHER): Payer: Self-pay

## 2018-04-29 ENCOUNTER — Telehealth (INDEPENDENT_AMBULATORY_CARE_PROVIDER_SITE_OTHER): Payer: Self-pay

## 2018-04-29 NOTE — Telephone Encounter (Signed)
Message left on triage phone from pts mother stating we need to send a rx for prosthetic leg to [facility] and this needs to be done by 05/06/18

## 2018-04-29 NOTE — Telephone Encounter (Signed)
Pt was called Rx sent to [facility].

## 2018-05-04 ENCOUNTER — Telehealth (INDEPENDENT_AMBULATORY_CARE_PROVIDER_SITE_OTHER): Payer: Self-pay | Admitting: Orthopedic Surgery

## 2018-05-04 NOTE — Telephone Encounter (Signed)
disregard

## 2018-05-06 ENCOUNTER — Telehealth (INDEPENDENT_AMBULATORY_CARE_PROVIDER_SITE_OTHER): Payer: Self-pay | Admitting: Radiology

## 2018-05-06 NOTE — Telephone Encounter (Signed)
Called and spoke with [facility], patient unavailable and will call back to answer pre screening questions for appointment on 4/6

## 2018-05-06 NOTE — Telephone Encounter (Signed)
Patient called back and answered NO to all pre screening questions for appointment on 4/6 

## 2018-05-10 ENCOUNTER — Encounter (INDEPENDENT_AMBULATORY_CARE_PROVIDER_SITE_OTHER): Payer: Self-pay | Admitting: Orthopedic Surgery

## 2018-05-10 ENCOUNTER — Ambulatory Visit (INDEPENDENT_AMBULATORY_CARE_PROVIDER_SITE_OTHER): Payer: Medicaid Other | Admitting: Physician Assistant

## 2018-05-10 ENCOUNTER — Other Ambulatory Visit: Payer: Self-pay

## 2018-05-10 VITALS — Ht 70.0 in | Wt 232.0 lb

## 2018-05-10 DIAGNOSIS — Z89512 Acquired absence of left leg below knee: Secondary | ICD-10-CM | POA: Diagnosis not present

## 2018-05-11 NOTE — Progress Notes (Signed)
Office Visit Note   Patient: Damon BeckSamuel D Alexander           Date of Birth: Mar 25, 1985           MRN: [id number] Visit Date: 05/10/2018              Requested by: Mike Gipouglas, Andre, FNP [address] [place] , [place] [address] PCP: Patient, No Pcp Per  Chief Complaint  Patient presents with  . Left Leg - Follow-up    08/02/17 left BKA      HPI: The patient is a 33 yo gentleman who is seen for post operative follow up following a left transtibial amputation on 08/02/2017. The patient is working with [employer] for fabrication of a prosthesis.   Assessment & Plan: Visit Diagnoses:  1. History of left below knee amputation [facility](HCC)     Plan: Patient was given a prescription for a K2 prosthesis with materials and supplies. He will follow up as needed for questions or concerns.  The patient should be able to be a limited community ambulator and be able to manage community barriers such as stairs, curbs and unlevel surfaces.  Follow-Up Instructions: Return if symptoms worsen or fail to improve.   Ortho Exam  Patient is alert, oriented, no adenopathy, well-dressed, normal affect, normal respiratory effort. The left transtibial amputation site is well healed and consolidated. There are no signs of breakdown or infection or cellulitis. Full knee extension. He is using a vive stump shrinker stocking and has no edema of the residual limb.   Imaging: No results found. No images are attached to the encounter.  Labs: Lab Results  Component Value Date   HGBA1C 6.2 (H) 08/04/2017   ESRSEDRATE 15 07/30/2017   ESRSEDRATE 3 08/31/2016   ESRSEDRATE 1 02/12/2016   CRP 7.8 (H) 07/30/2017   CRP <0.8 08/31/2016   CRP <0.8 02/12/2016   REPTSTATUS 08/04/2017 FINAL 07/30/2017   GRAMSTAIN  07/30/2017    RARE WBC PRESENT, PREDOMINANTLY PMN RARE GRAM POSITIVE COCCI Performed at [facility], [address] [place], [place] [address]    CULT  07/30/2017    NO GROWTH 5 DAYS Performed at [facility], [address] [place], [place] [address]    LABORGA METHICILLIN RESISTANT STAPHYLOCOCCUS AUREUS 07/30/2017     Lab Results  Component Value Date   ALBUMIN 3.6 07/26/2016   ALBUMIN 3.8 02/12/2016   ALBUMIN 3.5 10/08/2015    Body mass index is 33.29 kg/m.  Orders:  No orders of the defined types were placed in this encounter.  No orders of the defined types were placed in this encounter.    Procedures: No procedures performed  Clinical Data: No additional findings.  ROS:  All other systems negative, except as noted in the HPI. Review of Systems  Objective: Vital Signs: Ht 5\' 10"  (1.778 m)   Wt 232 lb (105.2 kg)   BMI 33.29 kg/m   Specialty Comments:  No specialty comments available.  PMFS History: Patient Active Problem List   Diagnosis Date Noted  . History of left below knee amputation (HCC) 08/12/2017  . Osteomyelitis (HCC) 08/03/2017  . MRSA bacteremia 08/03/2017  . 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 Left leg swelling 07/30/2017  . Abscess of left leg 07/28/2016  . Left leg cellulitis 07/26/2016  . Hypokalemia 07/26/2016  . 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 Displaced pilon fracture of left tibia, subsequent encounter  for open fracture type IIIA, IIIB, or IIIC with malunion 03/06/2016  . 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 HTN (hypertension) 12/05/2015  . 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 Open fracture of tibial plafond 12/04/2015  . Type III open fracture of left tibial plafond with involvement of fibula 10/12/2015  . Alcohol abuse 10/12/2015  . Nicotine dependence 10/12/2015   Past Medical History:  Diagnosis Date  . 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 Displaced pilon fracture of left tibia, subsequent encounter for open fracture type IIIA, IIIB, or IIIC with malunion 03/06/2016  . HTN (hypertension) 12/05/2015   pt denies  this  . Medical history non-contributory   . Nicotine dependence 10/12/2015  . 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  . Type III open fracture of left tibial plafond with involvement of fibula 10/12/2015    Family History  Problem Relation Age of Onset  . Hypertension Mother     Past Surgical History:  Procedure Laterality Date  . AMPUTATION Left 08/02/2017   Procedure: AMPUTATION BELOW KNEE;  Surgeon: Nadara Mustard, MD;  Location: [facility] OR;  Service: Orthopedics;  Laterality: Left;  . ANKLE FUSION Left 02/12/2016   Procedure: ARTHRODESIS ANKLE;  Surgeon: Myrene Galas, MD;  Location: [facility] OR;  Service: Orthopedics;  Laterality: Left;  . APPLICATION OF WOUND VAC Left 10/05/2015   Procedure: APPLICATION OF WOUND VAC;  Surgeon: Cammy Copa, MD;  [facility] OR;  Service: Orthopedics;  Laterality: Left;  . APPLICATION OF WOUND VAC Left 10/09/2015   Procedure: APPLICATION OF WOUND VAC;  Surgeon: Myrene Galas, MD;  [facility] OR;  Service: Orthopedics;  Laterality: Left;  . EXTERNAL FIXATION LEG Left 10/05/2015   Procedure: EXTERNAL FIXATION LEG LEFT ANKLE & LEFT LOWER LEG;  Surgeon: Cammy Copa, MD;  Location: [facility] OR;  Service: Orthopedics;  Laterality: Left;  . EXTERNAL FIXATION REMOVAL Left 12/04/2015   Procedure: REMOVAL EXTERNAL FIXATION LEFT LEG;  Surgeon: Myrene Galas, MD;  Location: [facility] OR;  Service: Orthopedics;  Laterality: Left;  . I&D EXTREMITY Left 10/05/2015   Procedure: IRRIGATION AND DEBRIDEMENT EXTREMITY LEFT ANKLE & LEFT  LOWER LEG,PLACEMENT OF ANTIBIOTIC BEADS;  Surgeon: Cammy Copa, MD;  Location: [facility] OR;  Service: Orthopedics;  Laterality: Left;  . I&D EXTREMITY Left 10/09/2015   Procedure: IRRIGATION AND DEBRIDEMENT LEFT ANKLE POSSIBLE EX-FIX ADJUSTMENT;  Surgeon: Myrene Galas, MD;  Location: [facility] OR;  Service: Orthopedics;  Laterality: Left;  . I&D EXTREMITY Left 07/27/2016   Procedure: IRRIGATION AND DEBRIDEMENT EXTREMITY;  Surgeon: Sheral Apley, MD;   Location: [facility] OR;  Service: Orthopedics;  Laterality: Left;  . LEG SURGERY     "rod in my right leg"  . MANDIBLE FRACTURE SURGERY    . NO PAST SURGERIES    . SKIN SPLIT GRAFT Left 12/04/2015   Procedure: SKIN GRAFT SPLIT THICKNESS LEFT LEG;  Surgeon: Myrene Galas, MD;  Location: [facility] OR;  Service: Orthopedics;  Laterality: Left;  . TIBIA IM NAIL INSERTION Right 07/31/2012   Procedure: INTRAMEDULLARY (IM) NAIL TIBIAL;  Surgeon: Nadara Mustard, MD;  Location: [facility] OR;  Service: Orthopedics;  Laterality: Right;  Intramedullary Nail right tib/fib   Social History   Occupational History  . Not on file  Tobacco Use  . Smoking status: Current Every Day Smoker    Packs/day: 0.50    Types: Cigarettes  . Smokeless tobacco: Never Used  Substance and  Sexual Activity  . Alcohol use: Yes  . Drug use: No  . Sexual activity: Not on file

## 2018-05-12 ENCOUNTER — Encounter (INDEPENDENT_AMBULATORY_CARE_PROVIDER_SITE_OTHER): Payer: Self-pay | Admitting: Physician Assistant

## 2018-06-10 NOTE — Telephone Encounter (Signed)
Message sent to provider 

## 2018-12-05 ENCOUNTER — Emergency Department (HOSPITAL_COMMUNITY)
Admission: EM | Admit: 2018-12-05 | Discharge: 2018-12-05 | Disposition: A | Discharge status: Home / Self Care | Payer: Medicaid Other | Attending: Emergency Medicine | Admitting: Emergency Medicine

## 2018-12-05 ENCOUNTER — Emergency Department (HOSPITAL_COMMUNITY): Payer: Medicaid Other

## 2018-12-05 ENCOUNTER — Other Ambulatory Visit: Payer: Self-pay

## 2018-12-05 DIAGNOSIS — Y929 Unspecified place or not applicable: Secondary | ICD-10-CM | POA: Insufficient documentation

## 2018-12-05 DIAGNOSIS — Y908 Blood alcohol level of 240 mg/100 ml or more: Secondary | ICD-10-CM | POA: Diagnosis not present

## 2018-12-05 DIAGNOSIS — F10129 Alcohol abuse with intoxication, unspecified: Secondary | ICD-10-CM | POA: Insufficient documentation

## 2018-12-05 DIAGNOSIS — I10 Essential (primary) hypertension: Secondary | ICD-10-CM | POA: Insufficient documentation

## 2018-12-05 DIAGNOSIS — S21112A Laceration without foreign body of left front wall of thorax without penetration into thoracic cavity, initial encounter: Secondary | ICD-10-CM

## 2018-12-05 DIAGNOSIS — S41112A Laceration without foreign body of left upper arm, initial encounter: Secondary | ICD-10-CM

## 2018-12-05 DIAGNOSIS — Z79899 Other long term (current) drug therapy: Secondary | ICD-10-CM | POA: Diagnosis not present

## 2018-12-05 DIAGNOSIS — T148XXA Other injury of unspecified body region, initial encounter: Secondary | ICD-10-CM

## 2018-12-05 DIAGNOSIS — S21132A Puncture wound without foreign body of left front wall of thorax without penetration into thoracic cavity, initial encounter: Secondary | ICD-10-CM | POA: Insufficient documentation

## 2018-12-05 DIAGNOSIS — F17210 Nicotine dependence, cigarettes, uncomplicated: Secondary | ICD-10-CM | POA: Insufficient documentation

## 2018-12-05 DIAGNOSIS — S41032A Puncture wound without foreign body of left shoulder, initial encounter: Secondary | ICD-10-CM | POA: Insufficient documentation

## 2018-12-05 DIAGNOSIS — Y939 Activity, unspecified: Secondary | ICD-10-CM | POA: Diagnosis not present

## 2018-12-05 DIAGNOSIS — Y999 Unspecified external cause status: Secondary | ICD-10-CM | POA: Insufficient documentation

## 2018-12-05 DIAGNOSIS — F10920 Alcohol use, unspecified with intoxication, uncomplicated: Secondary | ICD-10-CM

## 2018-12-05 LAB — COMPREHENSIVE METABOLIC PANEL
ALT: 100 U/L — ABNORMAL HIGH (ref 0–44)
AST: 219 U/L — ABNORMAL HIGH (ref 15–41)
Albumin: 4.6 g/dL (ref 3.5–5.0)
Alkaline Phosphatase: 55 U/L (ref 38–126)
Anion gap: 14 (ref 5–15)
BUN: 10 mg/dL (ref 6–20)
CO2: 25 mmol/L (ref 22–32)
Calcium: 8.5 mg/dL — ABNORMAL LOW (ref 8.9–10.3)
Chloride: 101 mmol/L (ref 98–111)
Creatinine, Ser: 0.69 mg/dL (ref 0.61–1.24)
GFR calc Af Amer: >60 mL/min (ref >60)
GFR calc non Af Amer: >60 mL/min (ref >60)
Glucose, Bld: 77 mg/dL (ref 70–99)
Potassium: 3.5 mmol/L (ref 3.5–5.1)
Sodium: 140 mmol/L (ref 135–145)
Total Bilirubin: 0.8 mg/dL (ref 0.3–1.2)
Total Protein: 7.9 g/dL (ref 6.5–8.1)

## 2018-12-05 LAB — CBC WITH DIFFERENTIAL/PLATELET
Abs Immature Granulocytes: 0.01 10*3/uL (ref 0.00–0.07)
Basophils Absolute: 0.1 10*3/uL (ref 0.0–0.1)
Basophils Relative: 2 %
Eosinophils Absolute: 0 10*3/uL (ref 0.0–0.5)
Eosinophils Relative: 1 %
HCT: 38.7 % — ABNORMAL LOW (ref 39.0–52.0)
Hemoglobin: 12.7 g/dL — ABNORMAL LOW (ref 13.0–17.0)
Immature Granulocytes: 0 %
Lymphocytes Relative: 49 %
Lymphs Abs: 2.6 10*3/uL (ref 0.7–4.0)
MCH: 29.1 pg (ref 26.0–34.0)
MCHC: 32.8 g/dL (ref 30.0–36.0)
MCV: 88.6 fL (ref 80.0–100.0)
Monocytes Absolute: 0.4 10*3/uL (ref 0.1–1.0)
Monocytes Relative: 8 %
Neutro Abs: 2.2 10*3/uL (ref 1.7–7.7)
Neutrophils Relative %: 40 %
Platelets: 202 10*3/uL (ref 150–400)
RBC: 4.37 MIL/uL (ref 4.22–5.81)
RDW: 13.9 % (ref 11.5–15.5)
WBC: 5.3 10*3/uL (ref 4.0–10.5)
nRBC: 0 % (ref 0.0–0.2)

## 2018-12-05 LAB — ETHANOL: Alcohol, Ethyl (B): 407 mg/dL (ref <10)

## 2018-12-05 MED ORDER — LIDOCAINE-EPINEPHRINE (PF) 2 %-1:200000 IJ SOLN
INTRAMUSCULAR | Status: AC
  Administered 2018-12-05: 20 mL
  Filled 2018-12-05: qty 20

## 2018-12-05 MED ORDER — FENTANYL CITRATE (PF) 100 MCG/2ML IJ SOLN
50.0000 ug | Freq: Once | INTRAMUSCULAR | Status: AC
  Administered 2018-12-05: 50 ug via INTRAVENOUS
  Filled 2018-12-05: qty 2

## 2018-12-05 MED ORDER — FENTANYL CITRATE (PF) 100 MCG/2ML IJ SOLN
50.0000 ug | Freq: Once | INTRAMUSCULAR | Status: AC
  Administered 2018-12-05: 50 ug via INTRAVENOUS

## 2018-12-05 MED ORDER — OXYCODONE-ACETAMINOPHEN 5-325 MG PO TABS: 1.0000 | ORAL_TABLET | Freq: Four times a day (QID) | ORAL | 0 refills | Status: DC | PRN

## 2018-12-05 MED ORDER — FENTANYL CITRATE (PF) 100 MCG/2ML IJ SOLN
INTRAMUSCULAR | Status: AC
  Administered 2018-12-05: 50 ug
  Filled 2018-12-05: qty 2

## 2018-12-05 MED ORDER — FENTANYL CITRATE (PF) 100 MCG/2ML IJ SOLN: 25.0000 ug | Freq: Once | INTRAMUSCULAR | Status: DC

## 2018-12-05 MED ORDER — CEPHALEXIN 500 MG PO CAPS
500.0000 mg | ORAL_CAPSULE | Freq: Once | ORAL | Status: AC
  Administered 2018-12-05: 500 mg via ORAL
  Filled 2018-12-05: qty 1

## 2018-12-05 MED ORDER — NAPROXEN 500 MG PO TABS: 500.0000 mg | ORAL_TABLET | Freq: Two times a day (BID) | ORAL | 0 refills | Status: DC

## 2018-12-05 MED ORDER — IOHEXOL 350 MG/ML SOLN
100.0000 mL | Freq: Once | INTRAVENOUS | Status: AC | PRN
  Administered 2018-12-05: 100 mL via INTRAVENOUS

## 2018-12-05 MED ORDER — CEPHALEXIN 500 MG PO CAPS: 500.0000 mg | ORAL_CAPSULE | Freq: Four times a day (QID) | ORAL | 0 refills | Status: AC

## 2018-12-05 MED ORDER — FENTANYL CITRATE (PF) 100 MCG/2ML IJ SOLN
INTRAMUSCULAR | Status: AC
  Filled 2018-12-05: qty 2

## 2018-12-05 MED ORDER — POVIDONE-IODINE 10 % EX SOLN
CUTANEOUS | Status: AC
  Administered 2018-12-05: 17:00:00
  Filled 2018-12-05: qty 15

## 2018-12-05 MED ORDER — OXYCODONE-ACETAMINOPHEN 5-325 MG PO TABS
1.0000 | ORAL_TABLET | Freq: Once | ORAL | Status: AC
  Administered 2018-12-05: 1 via ORAL
  Filled 2018-12-05: qty 1

## 2018-12-05 MED ORDER — LACTATED RINGERS IV BOLUS
1000.0000 mL | Freq: Once | INTRAVENOUS | Status: AC
  Administered 2018-12-05: 1000 mL via INTRAVENOUS

## 2018-12-05 NOTE — ED Notes (Signed)
CRITICAL VALUE ALERT  Critical Value:  Alcohol 407  Date & Time Notied:  1507    Provider Notified:Dystka  Orders Received/Actions taken:
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
## 2018-12-05 NOTE — ED Notes (Signed)
Pt discharged gone over with brother Damon Alexander Sister in low in room and encouraged to leave as she appeared quite impaired and was loud and could not be redirected - she had a strong odor of etoh beverage   Dr D spoke to pt's mother Izora Gala by telephone Regarding care and discharge
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
## 2018-12-05 NOTE — ED Notes (Signed)
Summary of nursing care  Pt in with 4 in cut to L upper chest with active bleed and cut to L lateral upper arm of 3 inches with active bleed  Wounds sutured and stapled with 4 staple packages  2 suture trays  Quick clot x 6 4x4 x 4   Pt to CT with EDP, RNx2 Returned to rm 7  Wound reopened to chest due to large swelling to L upper chest above nipple and quick clot x 2 applied with pressure to wound by RN x 40 minutes after EPD spoke w Dr Oneida Alar  Pt tolerated poorly shouting and moving about Pain meds, distraction, humor partially successful as well as music therapy  afterr 40 minutes chest wound not actively bleeding seeping slowly and dressing of 4x4 x 2 with kerlix x 2 applies under 4 in ace x2 and coban x 1 to chest in pressure dressing  Pt calmer and the arm wound dressed with 4x4 folded in half, kerlix x 1 , ace x 1 and coban x 1 application in pressure dressing  Pt cleansed of large amount of blood to body  Linens changed  Pt clother in clothing brought from home save top so that EPD can reassess  Pt requested pain meds  Provided   Mother of pt Izora Gala called by EPD [phone]

## 2018-12-05 NOTE — ED Notes (Signed)
Chest wall left upper laceration repair with sutures and staples by EDP at this time to stablize enough for CT scan.

## 2018-12-05 NOTE — Discharge Instructions (Addendum)
Record taking antibiotic as prescribed to will prevent infection.  Recommend keeping the wounds clean and dry, follow dressing changes as discussed.  If you develop redness, swelling, purulent drainage from wound, please return for reassessment.  Recommend going to your primary doctor in 1 week for a wound recheck.  You may also return to the emergency department for this.  Depending on how the wounds are healing at that time, they may perform staple removal.

## 2018-12-13 ENCOUNTER — Encounter (HOSPITAL_COMMUNITY): Payer: Self-pay | Admitting: Emergency Medicine

## 2018-12-13 ENCOUNTER — Emergency Department (HOSPITAL_COMMUNITY)
Admission: EM | Admit: 2018-12-13 | Discharge: 2018-12-13 | Disposition: A | Discharge status: Home / Self Care | Payer: Medicaid Other | Attending: Emergency Medicine | Admitting: Emergency Medicine

## 2018-12-13 ENCOUNTER — Other Ambulatory Visit: Payer: Self-pay

## 2018-12-13 DIAGNOSIS — Z4802 Encounter for removal of sutures: Secondary | ICD-10-CM | POA: Insufficient documentation

## 2018-12-13 DIAGNOSIS — Z89512 Acquired absence of left leg below knee: Secondary | ICD-10-CM | POA: Diagnosis not present

## 2018-12-13 DIAGNOSIS — I10 Essential (primary) hypertension: Secondary | ICD-10-CM | POA: Diagnosis not present

## 2018-12-13 DIAGNOSIS — F17210 Nicotine dependence, cigarettes, uncomplicated: Secondary | ICD-10-CM | POA: Diagnosis not present

## 2018-12-13 DIAGNOSIS — Z79899 Other long term (current) drug therapy: Secondary | ICD-10-CM | POA: Insufficient documentation

## 2018-12-13 NOTE — ED Triage Notes (Signed)
Pt here to have staples removed from left arm and chest.

## 2018-12-13 NOTE — Discharge Instructions (Addendum)
Return if any problems.
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
## 2018-12-13 NOTE — ED Notes (Signed)
Steri strips applied by Santiago Glad NP and 2x2 gauge and tape applied to area

## 2018-12-14 ENCOUNTER — Other Ambulatory Visit: Payer: Self-pay

## 2018-12-14 ENCOUNTER — Emergency Department (HOSPITAL_COMMUNITY)
Admission: EM | Admit: 2018-12-14 | Discharge: 2018-12-14 | Disposition: A | Discharge status: Home / Self Care | Payer: Medicaid Other | Attending: Emergency Medicine | Admitting: Emergency Medicine

## 2018-12-14 ENCOUNTER — Encounter (HOSPITAL_COMMUNITY): Payer: Self-pay

## 2018-12-14 DIAGNOSIS — Y92009 Unspecified place in unspecified non-institutional (private) residence as the place of occurrence of the external cause: Secondary | ICD-10-CM | POA: Insufficient documentation

## 2018-12-14 DIAGNOSIS — S41112D Laceration without foreign body of left upper arm, subsequent encounter: Secondary | ICD-10-CM

## 2018-12-14 DIAGNOSIS — Y939 Activity, unspecified: Secondary | ICD-10-CM | POA: Insufficient documentation

## 2018-12-14 DIAGNOSIS — Z79899 Other long term (current) drug therapy: Secondary | ICD-10-CM | POA: Diagnosis not present

## 2018-12-14 DIAGNOSIS — Y999 Unspecified external cause status: Secondary | ICD-10-CM | POA: Insufficient documentation

## 2018-12-14 DIAGNOSIS — X58XXXA Exposure to other specified factors, initial encounter: Secondary | ICD-10-CM | POA: Insufficient documentation

## 2018-12-14 DIAGNOSIS — F17210 Nicotine dependence, cigarettes, uncomplicated: Secondary | ICD-10-CM | POA: Insufficient documentation

## 2018-12-14 DIAGNOSIS — Z5189 Encounter for other specified aftercare: Secondary | ICD-10-CM

## 2018-12-14 DIAGNOSIS — S41112A Laceration without foreign body of left upper arm, initial encounter: Secondary | ICD-10-CM | POA: Insufficient documentation

## 2018-12-14 MED ORDER — POVIDONE-IODINE 10 % EX SOLN
CUTANEOUS | Status: AC
  Administered 2018-12-14: 03:00:00
  Filled 2018-12-14: qty 75

## 2018-12-14 MED ORDER — CEPHALEXIN 500 MG PO CAPS: 500.0000 mg | ORAL_CAPSULE | Freq: Two times a day (BID) | ORAL | 0 refills | Status: DC

## 2018-12-14 MED ORDER — HYDROMORPHONE HCL 1 MG/ML IJ SOLN
1.0000 mg | Freq: Once | INTRAMUSCULAR | Status: AC
  Administered 2018-12-14: 1 mg via INTRAMUSCULAR
  Filled 2018-12-14: qty 1

## 2018-12-14 MED ORDER — HYDROCODONE-ACETAMINOPHEN 5-325 MG PO TABS
1.0000 | ORAL_TABLET | Freq: Once | ORAL | Status: AC
  Administered 2018-12-14: 1 via ORAL
  Filled 2018-12-14: qty 1

## 2018-12-14 MED ORDER — HYDROCODONE-ACETAMINOPHEN 5-325 MG PO TABS: 1.0000 | ORAL_TABLET | Freq: Four times a day (QID) | ORAL | 0 refills | Status: DC | PRN

## 2018-12-14 MED ORDER — LIDOCAINE-EPINEPHRINE (PF) 2 %-1:200000 IJ SOLN
20.0000 mL | Freq: Once | INTRAMUSCULAR | Status: AC
  Administered 2018-12-14: 20 mL
  Filled 2018-12-14: qty 20

## 2018-12-14 NOTE — ED Triage Notes (Signed)
Pt seen a few hours ago for staple removal. Pt dialed for ems because his wound came open.
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
## 2018-12-14 NOTE — ED Notes (Signed)
No discharge vitals obtained. Patient in a hurry and stated that his ride was waiting out front and he needed to go.

## 2018-12-15 ENCOUNTER — Encounter (HOSPITAL_COMMUNITY): Payer: Self-pay | Admitting: Emergency Medicine

## 2018-12-15 ENCOUNTER — Other Ambulatory Visit: Payer: Self-pay

## 2018-12-15 ENCOUNTER — Emergency Department (HOSPITAL_COMMUNITY)
Admission: EM | Admit: 2018-12-15 | Discharge: 2018-12-15 | Disposition: A | Discharge status: Home / Self Care | Payer: Medicaid Other | Attending: Emergency Medicine | Admitting: Emergency Medicine

## 2018-12-15 DIAGNOSIS — Z4800 Encounter for change or removal of nonsurgical wound dressing: Secondary | ICD-10-CM | POA: Diagnosis present

## 2018-12-15 DIAGNOSIS — Z5321 Procedure and treatment not carried out due to patient leaving prior to being seen by health care provider: Secondary | ICD-10-CM | POA: Insufficient documentation

## 2018-12-15 NOTE — ED Triage Notes (Signed)
Patient reports his wound will not stayed closed. Wound to L upper chest. Patient states area opened up last night when the strips came off. Patient reports "gushing blood." No bleeding at present.

## 2018-12-16 ENCOUNTER — Emergency Department (HOSPITAL_COMMUNITY)
Admission: EM | Admit: 2018-12-16 | Discharge: 2018-12-16 | Disposition: A | Discharge status: Home / Self Care | Payer: Medicaid Other | Attending: Emergency Medicine | Admitting: Emergency Medicine

## 2018-12-16 ENCOUNTER — Other Ambulatory Visit: Payer: Self-pay

## 2018-12-16 ENCOUNTER — Encounter (HOSPITAL_COMMUNITY): Payer: Self-pay | Admitting: Emergency Medicine

## 2018-12-16 DIAGNOSIS — F17210 Nicotine dependence, cigarettes, uncomplicated: Secondary | ICD-10-CM | POA: Insufficient documentation

## 2018-12-16 DIAGNOSIS — T8130XA Disruption of wound, unspecified, initial encounter: Secondary | ICD-10-CM | POA: Insufficient documentation

## 2018-12-16 DIAGNOSIS — Y69 Unspecified misadventure during surgical and medical care: Secondary | ICD-10-CM | POA: Insufficient documentation

## 2018-12-16 MED ORDER — KETOROLAC TROMETHAMINE 60 MG/2ML IM SOLN
60.0000 mg | Freq: Once | INTRAMUSCULAR | Status: AC
  Administered 2018-12-16: 60 mg via INTRAMUSCULAR
  Filled 2018-12-16: qty 2

## 2018-12-16 MED ORDER — DOXYCYCLINE HYCLATE 100 MG PO CAPS: 100.0000 mg | ORAL_CAPSULE | Freq: Two times a day (BID) | ORAL | 0 refills | Status: DC

## 2018-12-16 MED ORDER — NAPROXEN 500 MG PO TABS: 500.0000 mg | ORAL_TABLET | Freq: Two times a day (BID) | ORAL | 0 refills | Status: DC

## 2018-12-16 MED ORDER — DOXYCYCLINE HYCLATE 100 MG PO TABS
100.0000 mg | ORAL_TABLET | Freq: Once | ORAL | Status: AC
  Administered 2018-12-16: 100 mg via ORAL
  Filled 2018-12-16: qty 1
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
## 2018-12-16 NOTE — Discharge Instructions (Addendum)
Your wounds have opened because of infection.  Stitches or staples will not keep it together.  The wound will have to heal slowly from the inside.  This process can take several weeks to several months.  You need to change the dressing twice a day.  I suspect that when you have the stitches removed from the wound on your arm, that it will also pull apart and will have to heal the same way.  Please take the naproxen twice a day. You may take acetaminophen along with the naproxen for additional pain relief. You should not be taking narcotics this long after the original injury.
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
## 2019-02-04 IMAGING — DX DG SHOULDER 2+V*L*
2 series · 2 of 2 positions shown · non-contrast
Comparison: 11/26/2014

CLINICAL DATA: Acute LEFT shoulder pain following fall today.
Initial encounter.

EXAM:
LEFT SHOULDER - 2+ VIEW

[shoulder grashey]
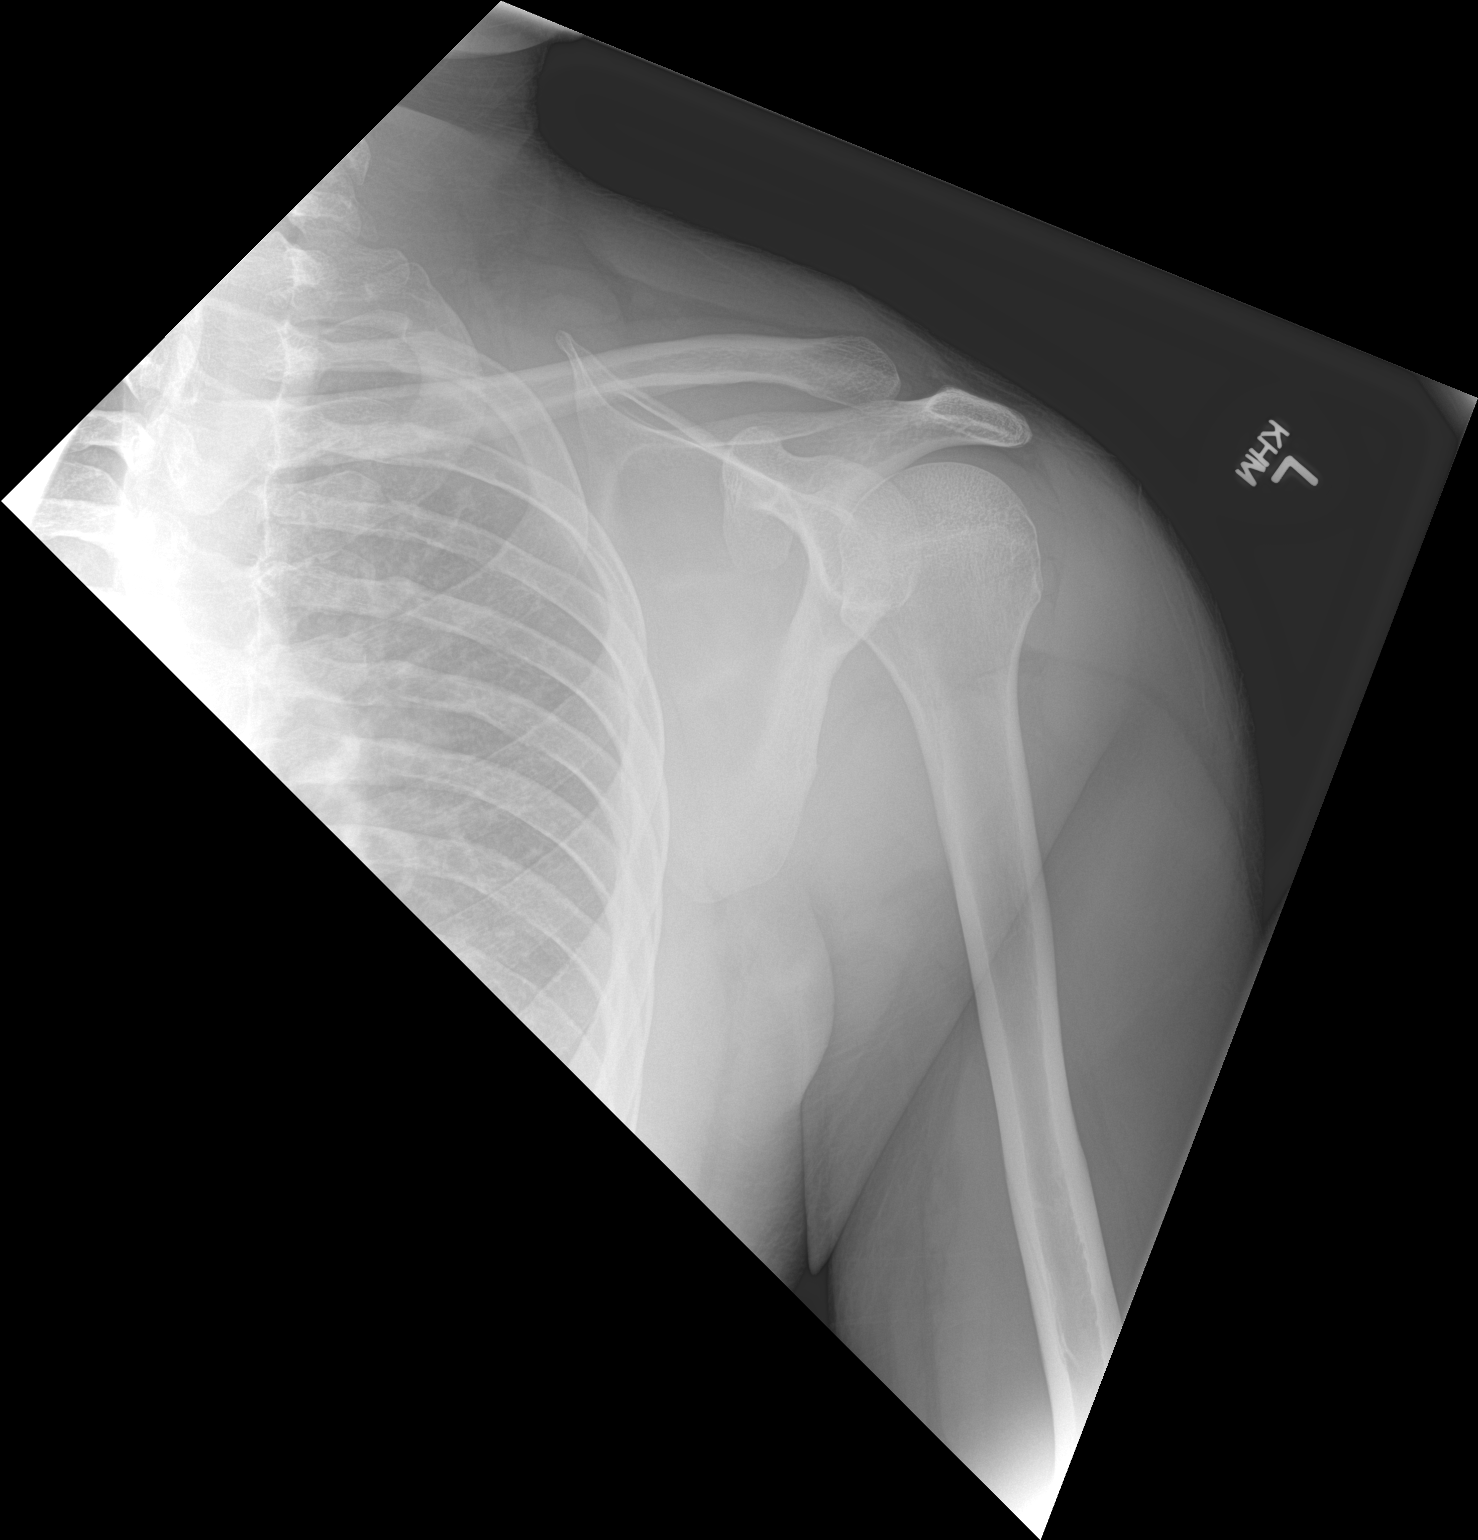

[shoulder y view]
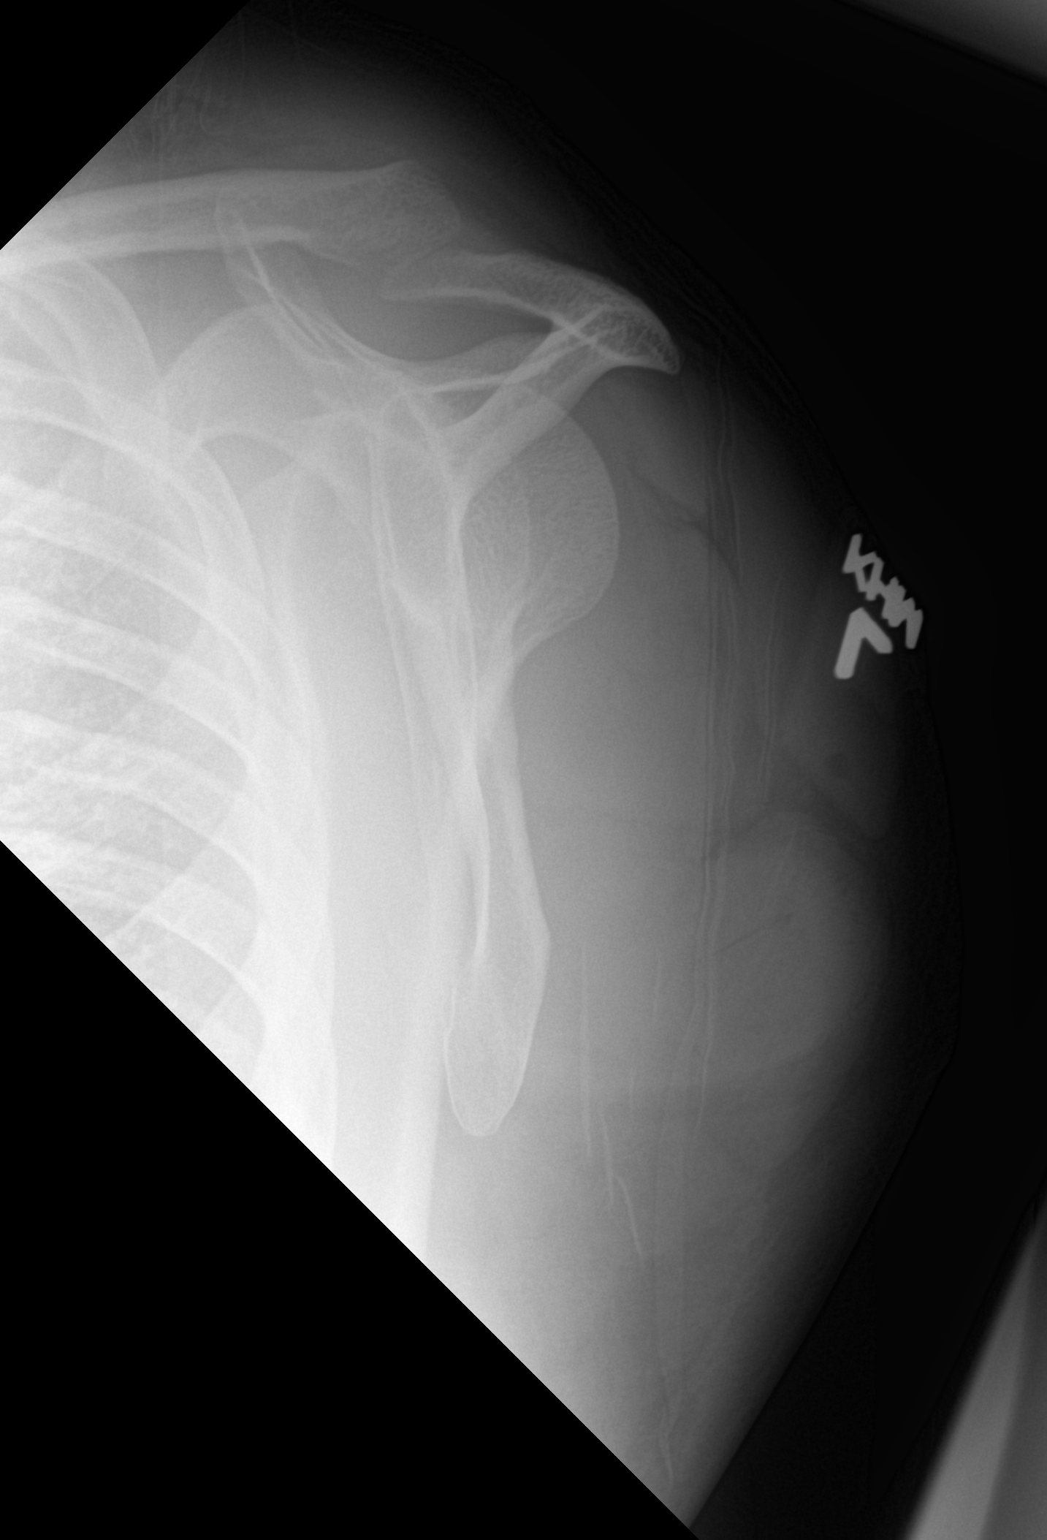

[2 of 2 positions shown; findings below may reference images not displayed]

FINDINGS: There is no evidence of fracture or dislocation. There is no
evidence of arthropathy or other focal bone abnormality. Soft
tissues are unremarkable.
IMPRESSION: Negative.

## 2019-02-04 IMAGING — DX DG TIBIA/FIBULA 2V*R*
3 series · 3 of 3 positions shown · non-contrast
Comparison: 12/02/2014

CLINICAL DATA: Acute RIGHT LOWER leg pain following fall today.
Initial encounter.

EXAM:
RIGHT TIBIA AND FIBULA - 2 VIEW

[tibia ap (1 of 2)]
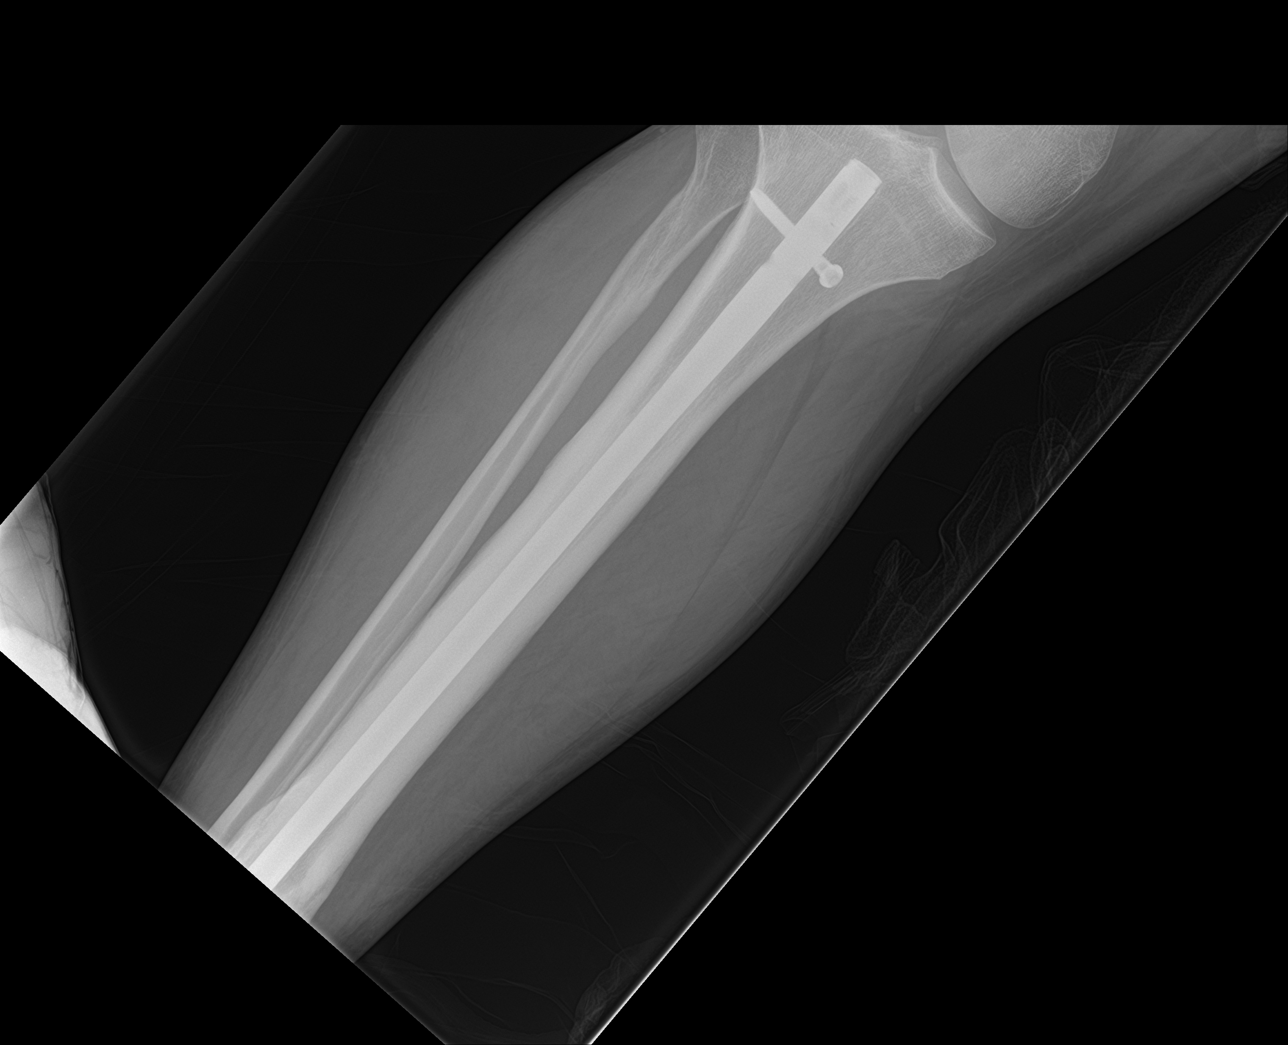

[tibia ap (2 of 2)]
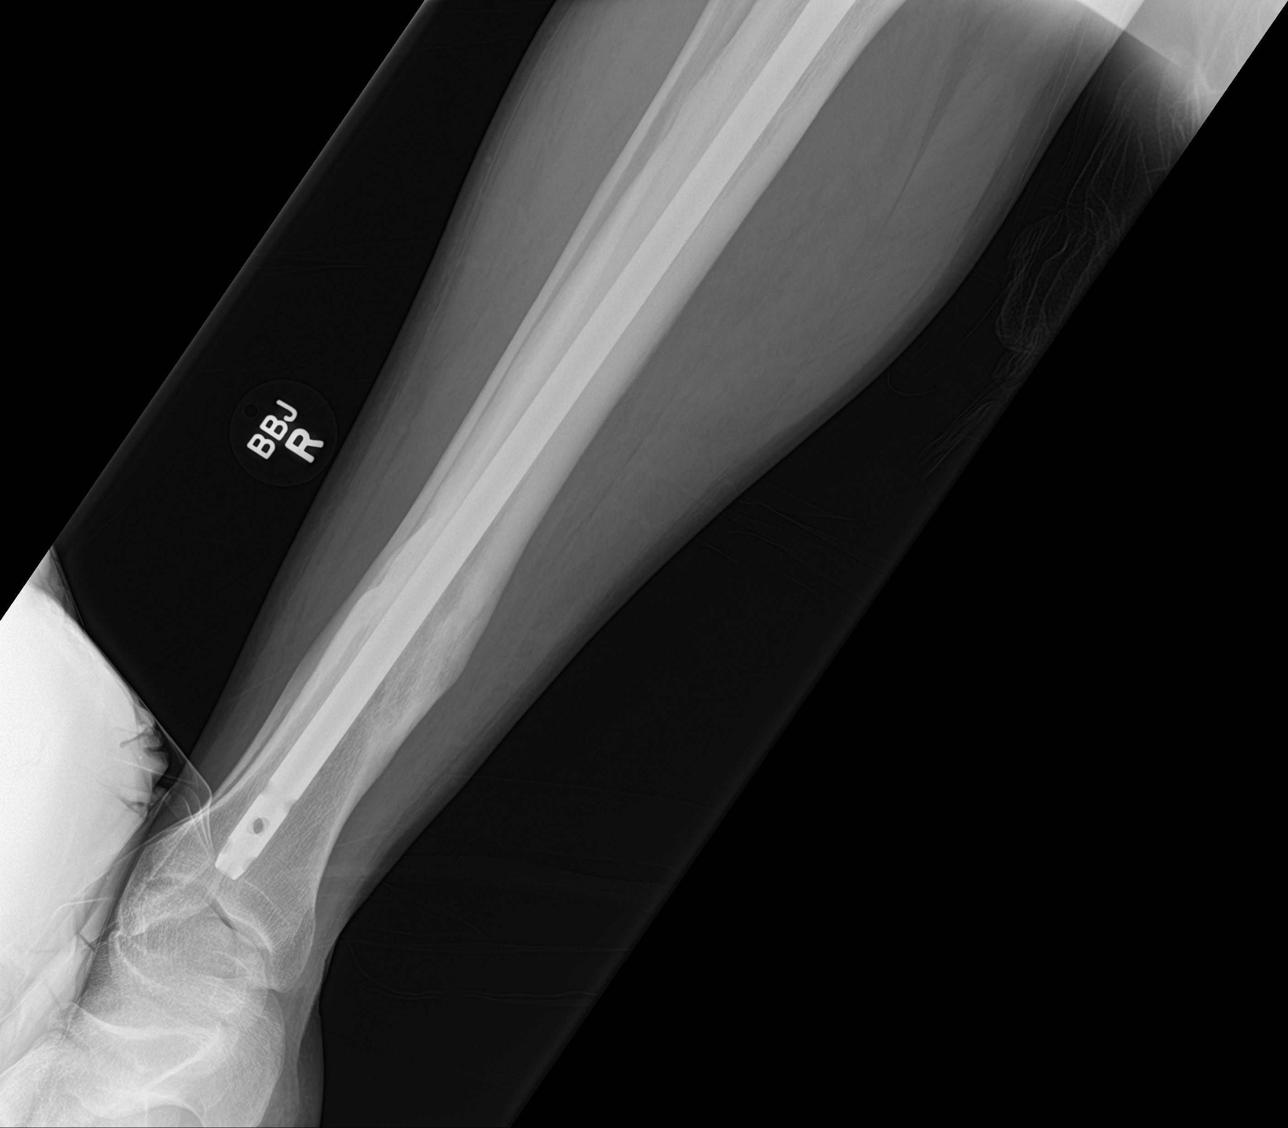

[tibia lat]
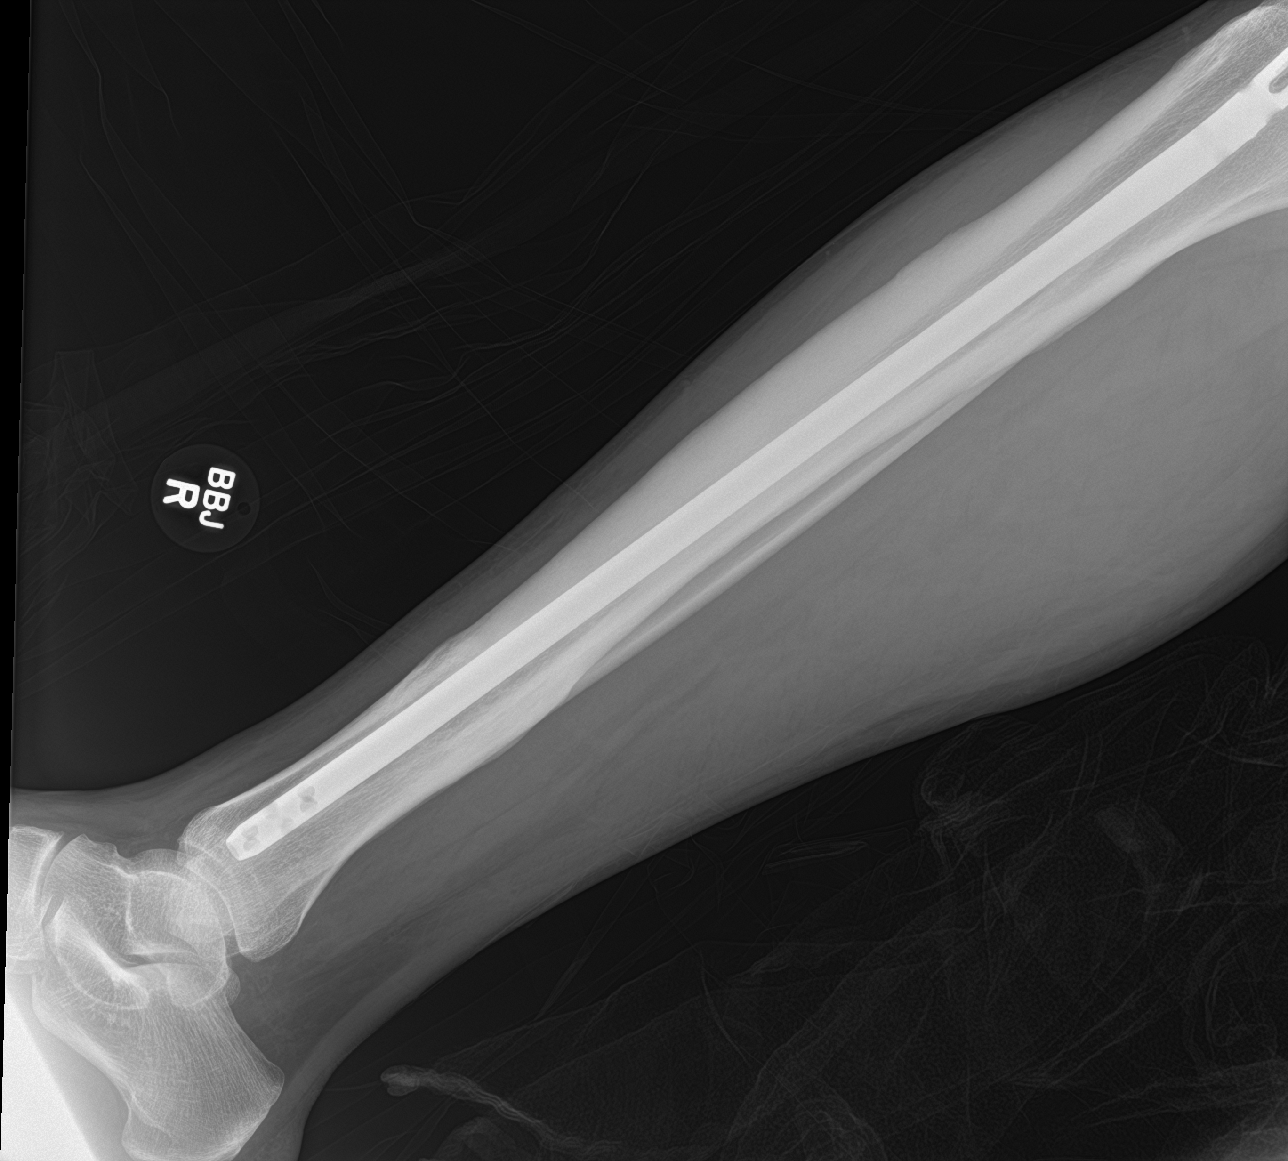

[3 of 3 positions shown; findings below may reference images not displayed]

FINDINGS: No acute fracture, subluxation or dislocation.

Remote fractures of the proximal fibula and mid-distal tibia noted.

Tibial nail and proximal screw again noted.
IMPRESSION: No acute abnormality.

## 2019-02-04 IMAGING — DX DG HAND COMPLETE 3+V*L*
3 series · 3 of 3 positions shown · non-contrast
Comparison: None.

CLINICAL DATA: Acute LEFT hand pain following fall today. Initial
encounter.

EXAM:
LEFT HAND - COMPLETE 3+ VIEW

[hand pa]
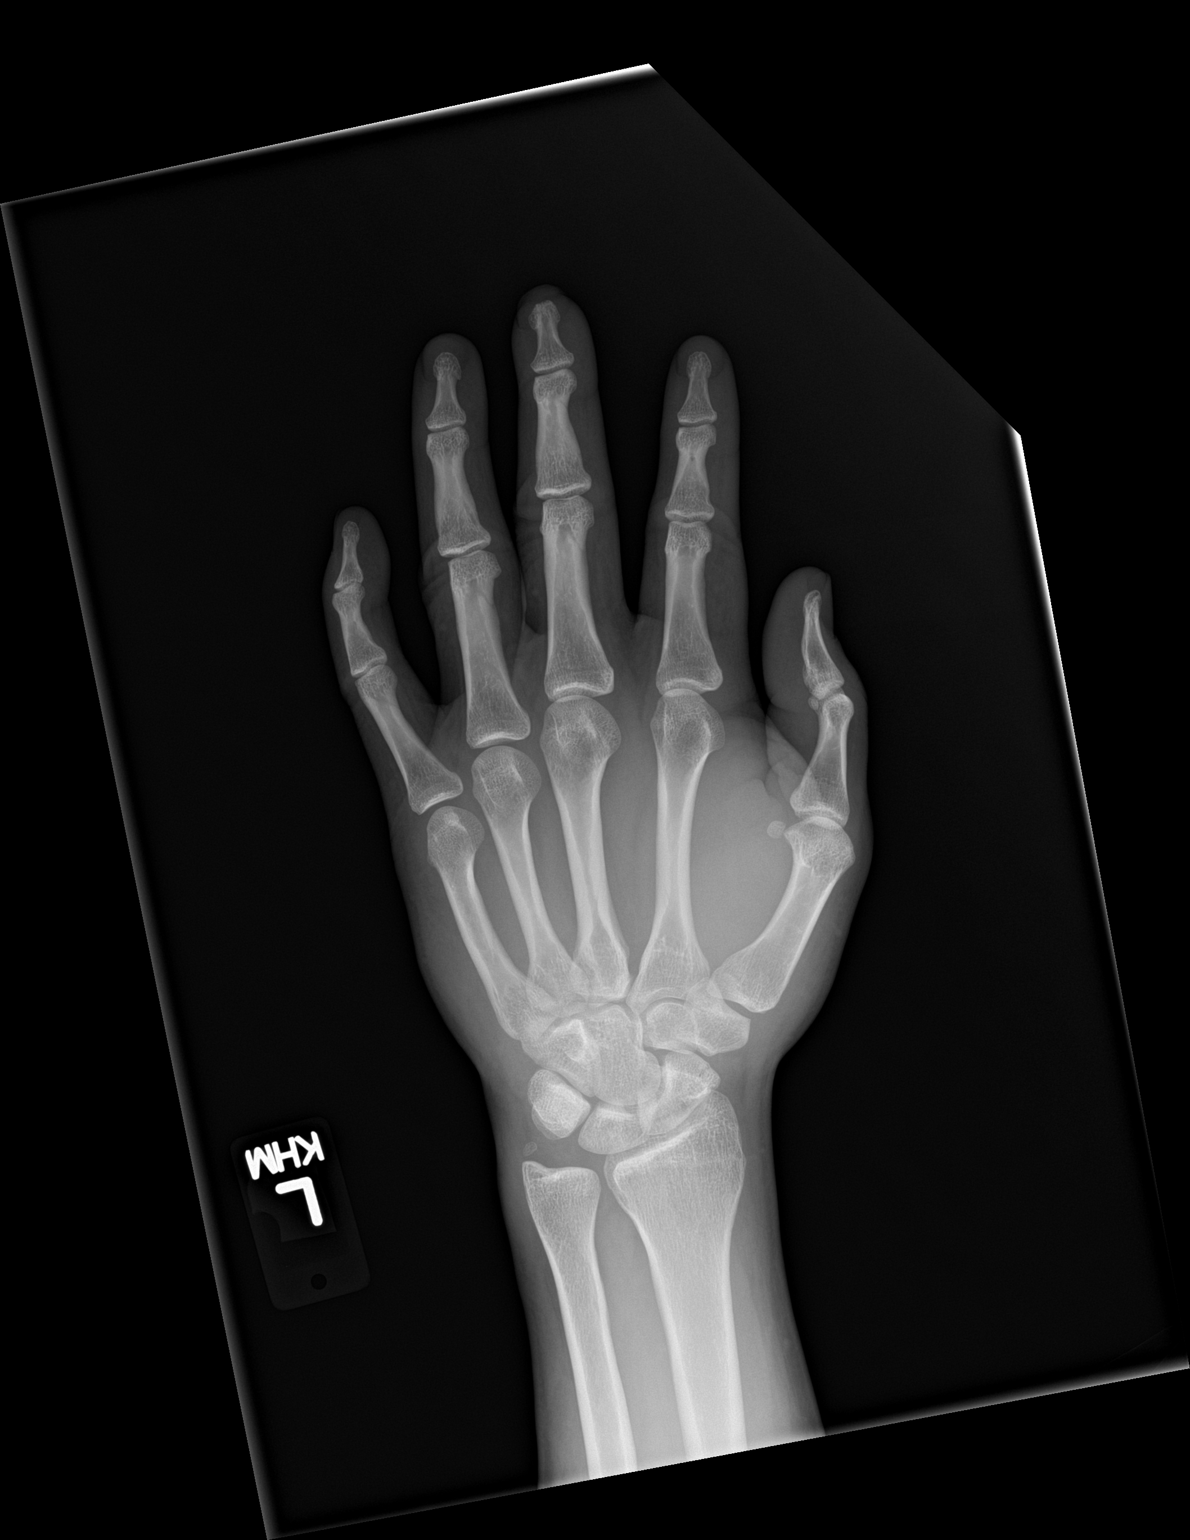

[hand obl]
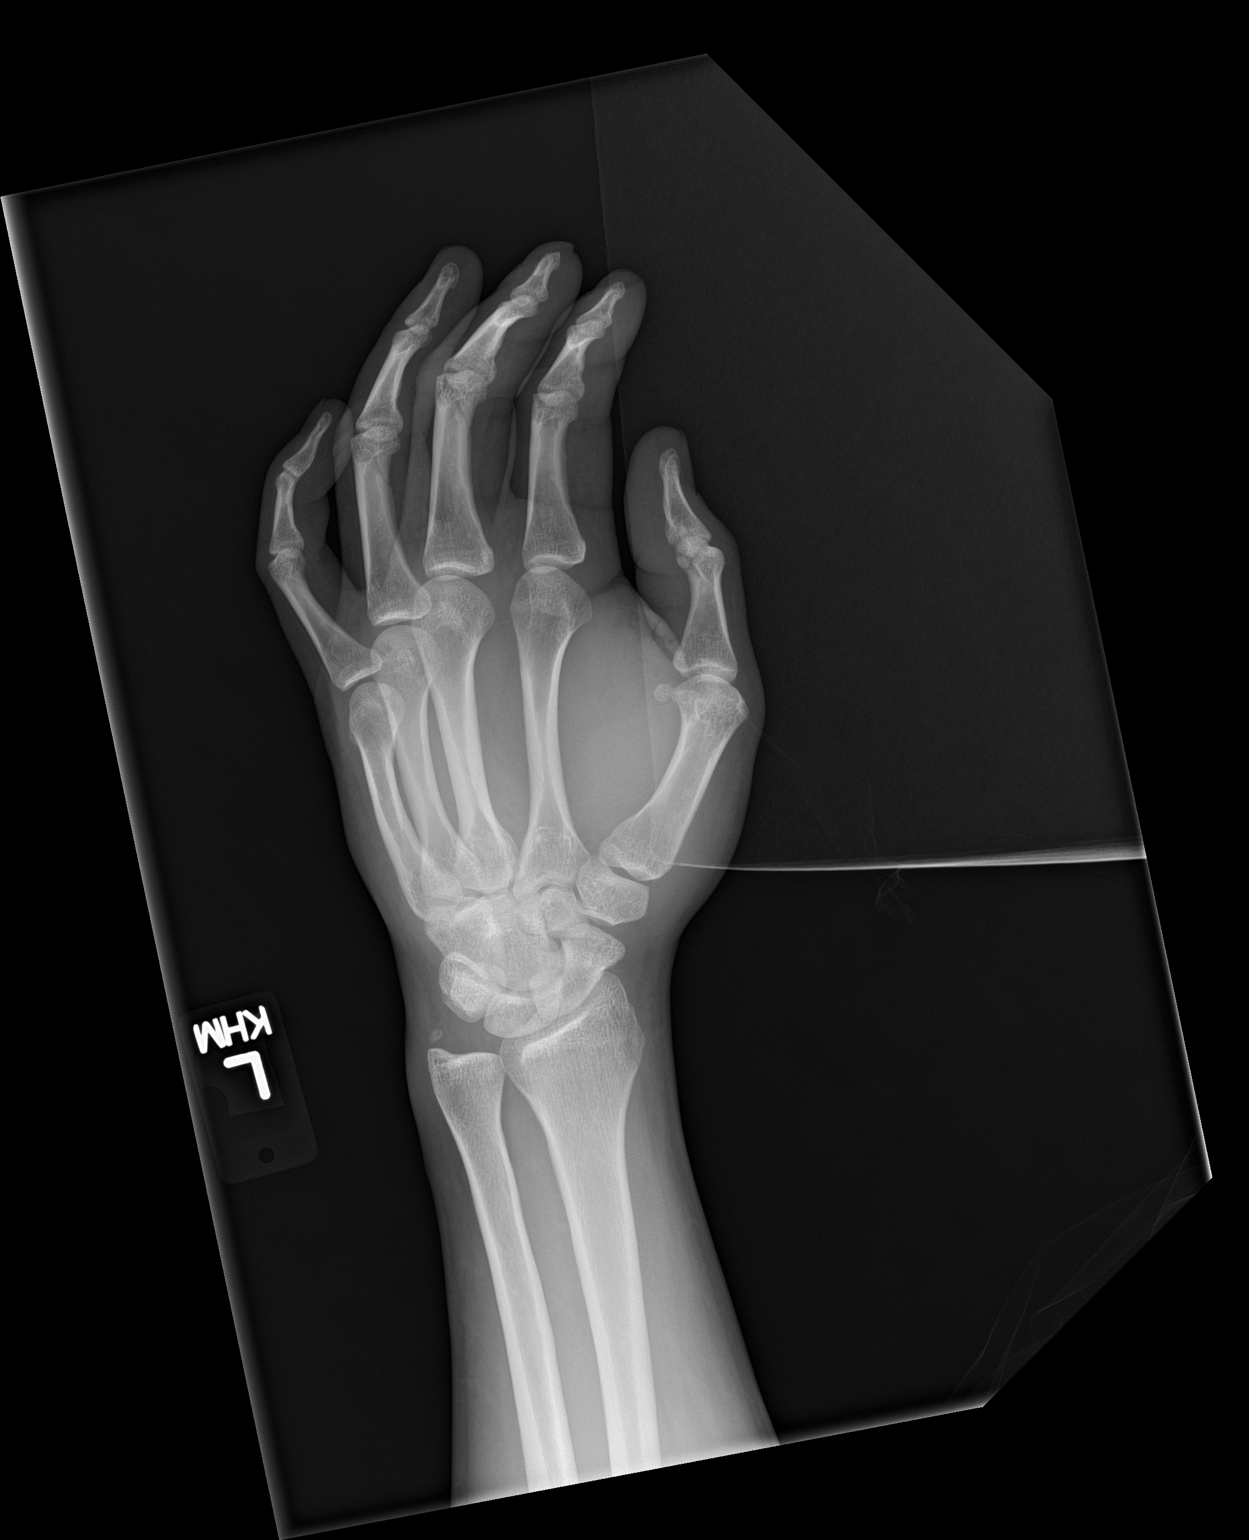

[hand lat]
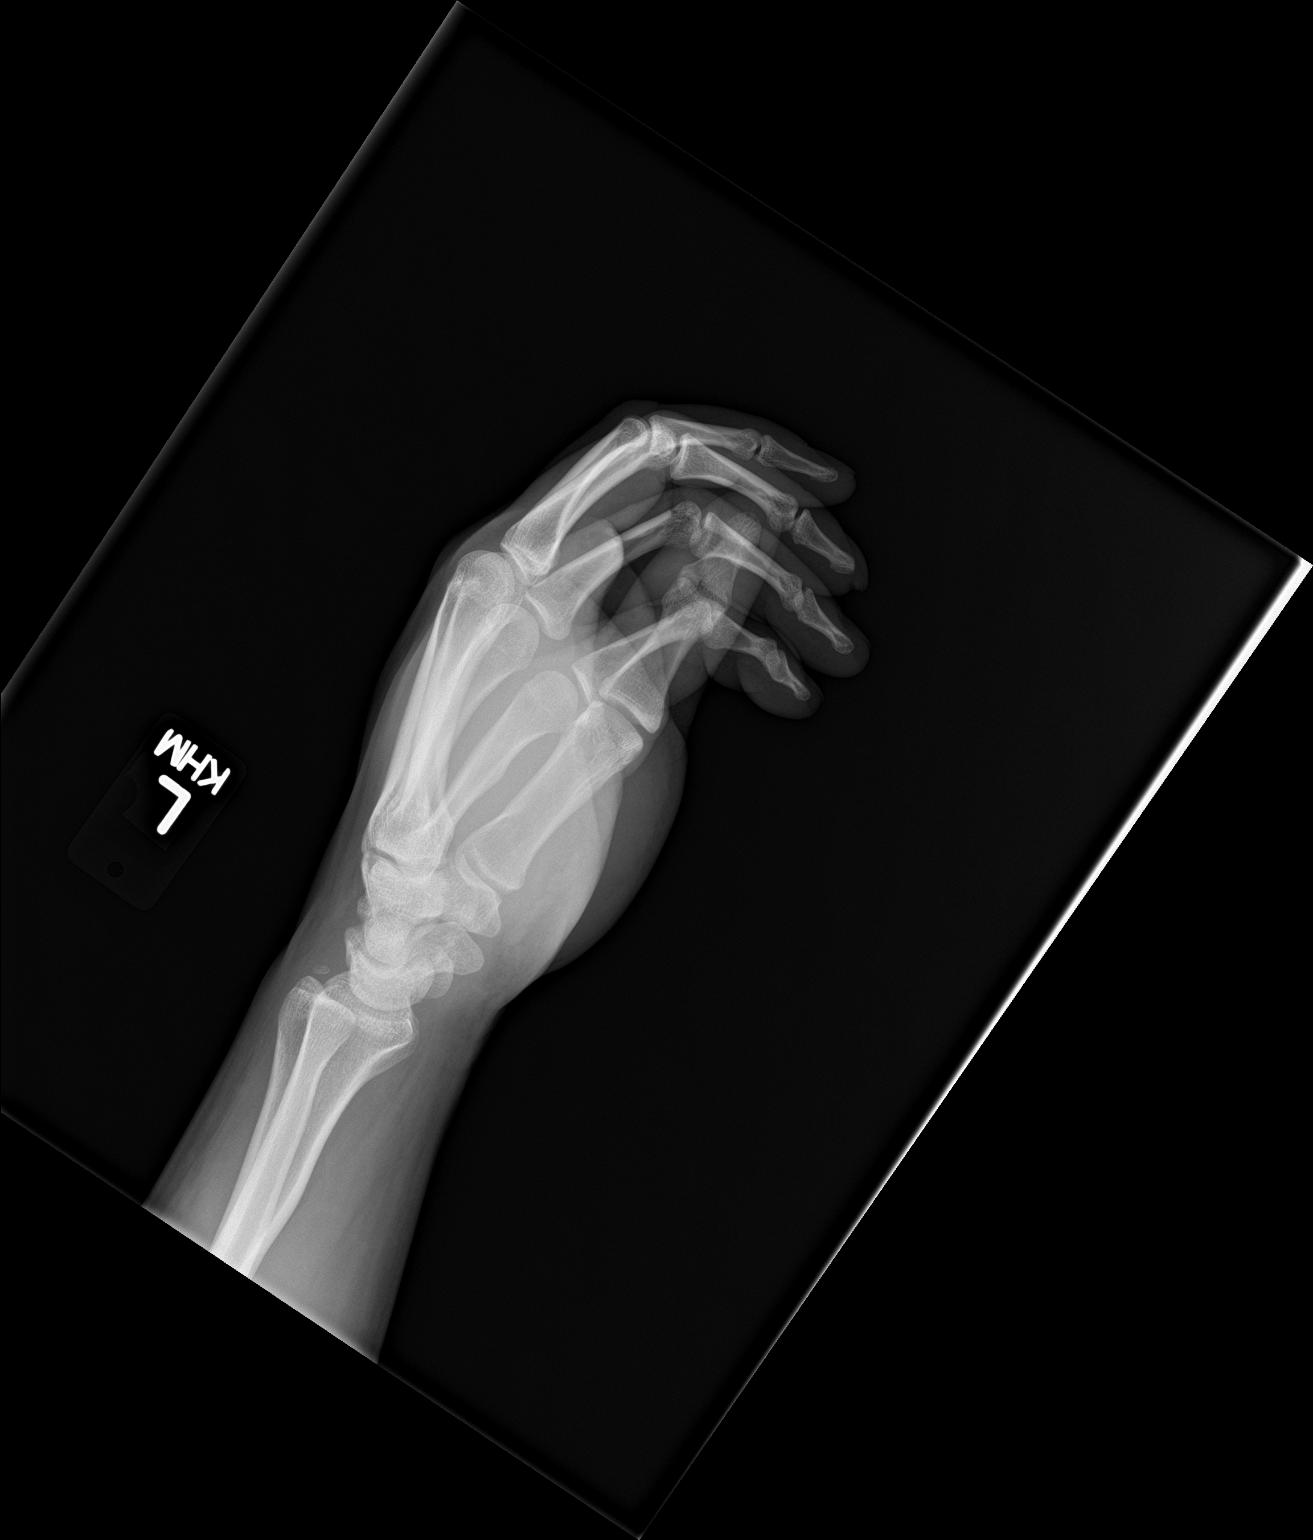

[3 of 3 positions shown; findings below may reference images not displayed]

FINDINGS: There is no evidence of acute fracture or dislocation. There is no
evidence of arthropathy or other focal bone abnormality. Soft
tissues are unremarkable.
IMPRESSION: Negative.

## 2019-04-29 ENCOUNTER — Emergency Department (HOSPITAL_COMMUNITY)
Admission: EM | Admit: 2019-04-29 | Discharge: 2019-04-30 | Disposition: A | Discharge status: Home / Self Care | Payer: Medicaid Other | Attending: Emergency Medicine | Admitting: Emergency Medicine

## 2019-04-29 ENCOUNTER — Other Ambulatory Visit: Payer: Self-pay

## 2019-04-29 ENCOUNTER — Encounter (HOSPITAL_COMMUNITY): Payer: Self-pay

## 2019-04-29 DIAGNOSIS — F10920 Alcohol use, unspecified with intoxication, uncomplicated: Secondary | ICD-10-CM | POA: Diagnosis not present

## 2019-04-29 DIAGNOSIS — Y939 Activity, unspecified: Secondary | ICD-10-CM | POA: Diagnosis not present

## 2019-04-29 DIAGNOSIS — Z791 Long term (current) use of non-steroidal anti-inflammatories (NSAID): Secondary | ICD-10-CM | POA: Insufficient documentation

## 2019-04-29 DIAGNOSIS — F17210 Nicotine dependence, cigarettes, uncomplicated: Secondary | ICD-10-CM | POA: Diagnosis not present

## 2019-04-29 DIAGNOSIS — S0990XA Unspecified injury of head, initial encounter: Secondary | ICD-10-CM | POA: Insufficient documentation

## 2019-04-29 DIAGNOSIS — S0101XA Laceration without foreign body of scalp, initial encounter: Secondary | ICD-10-CM | POA: Insufficient documentation

## 2019-04-29 DIAGNOSIS — Y999 Unspecified external cause status: Secondary | ICD-10-CM | POA: Diagnosis not present

## 2019-04-29 DIAGNOSIS — I10 Essential (primary) hypertension: Secondary | ICD-10-CM | POA: Diagnosis not present

## 2019-04-29 DIAGNOSIS — W06XXXA Fall from bed, initial encounter: Secondary | ICD-10-CM | POA: Diagnosis not present

## 2019-04-29 DIAGNOSIS — Y929 Unspecified place or not applicable: Secondary | ICD-10-CM | POA: Diagnosis not present

## 2019-04-29 DIAGNOSIS — E119 Type 2 diabetes mellitus without complications: Secondary | ICD-10-CM | POA: Diagnosis not present

## 2019-04-29 MED ORDER — LIDOCAINE-EPINEPHRINE (PF) 2 %-1:200000 IJ SOLN
INTRAMUSCULAR | Status: AC
  Filled 2019-04-29: qty 20

## 2019-04-29 MED ORDER — DOXYCYCLINE HYCLATE 100 MG PO TABS
100.0000 mg | ORAL_TABLET | Freq: Once | ORAL | Status: AC
  Administered 2019-04-30: 100 mg via ORAL
  Filled 2019-04-29: qty 1

## 2019-04-29 MED ORDER — LIDOCAINE-EPINEPHRINE (PF) 1 %-1:200000 IJ SOLN
10.0000 mL | Freq: Once | INTRAMUSCULAR | Status: AC
  Administered 2019-04-29: 10 mL via INTRADERMAL

## 2019-04-29 NOTE — ED Triage Notes (Signed)
Etoh on board.  Pt fell causing an area on his forehead that he had a previous bump, broke open and started bleeding.  Pt denies other complaints

## 2019-04-29 NOTE — ED Provider Notes (Signed)
[facility] EMERGENCY DEPARTMENT Provider Note   CSN: [id number] Arrival date & time: 04/29/19  2307     History Chief Complaint  Patient presents with  . Fall    Damon Alexander is a 34 y.o. male possible history of hypertension, seizures, nicotine dependence who presents via EMS for evaluation of fall and head injury.  Patient does report that he drank 1/5 of has a Cuervo earlier this evening.  He reports that he was playing around with his girlfriend on the bed and states that he fell off the bed and hit the floor.  No LOC.  He reports that of weakness so ago, he had an injury where he hit the his head on the corner of a table.  He has had a hematoma to that area since then.  He thinks he had the same area which caused it to bleed.  He denies any numbness/weakness of his arms or legs, vision changes.  He states his last tetanus shot was in 2017.  The history is provided by the patient.       Past Medical History:  Diagnosis Date  . 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 Displaced pilon fracture of left tibia, subsequent encounter for open fracture type IIIA, IIIB, or IIIC with malunion 03/06/2016  . HTN (hypertension) 12/05/2015   pt denies this  . Medical history non-contributory   . Nicotine dependence 10/12/2015  . 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  . Type III open fracture of left tibial plafond with involvement of fibula 10/12/2015    Patient Active Problem List   Diagnosis Date Noted  . History of left below knee amputation (HCC) 08/12/2017  . Osteomyelitis (HCC) 08/03/2017  . MRSA bacteremia 08/03/2017  . 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 Left leg swelling 07/30/2017  . Abscess of left leg 07/28/2016  . Left leg cellulitis 07/26/2016  . Hypokalemia 07/26/2016  . 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 Displaced pilon fracture of left tibia, subsequent encounter for open fracture type IIIA, IIIB, or IIIC  with malunion 03/06/2016  . 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 HTN (hypertension) 12/05/2015  . 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 Open fracture of tibial plafond 12/04/2015  . Type III open fracture of left tibial plafond with involvement of fibula 10/12/2015  . Alcohol abuse 10/12/2015  . Nicotine dependence 10/12/2015    Past Surgical History:  Procedure Laterality Date  . AMPUTATION Left 08/02/2017   Procedure: AMPUTATION BELOW KNEE;  Surgeon: Nadara Mustard, MD;  Location[facility] OR;  Service: Orthopedics;  Laterality: Left;  . ANKLE FUSION Left 02/12/2016   Procedure: ARTHRODESIS ANKLE;  Surgeon: Myrene Galas, MD;  Location: [facility] OR;  Service: Orthopedics;  Laterality: Left;  . APPLICATION OF WOUND VAC Left 10/05/2015   Procedure: APPLICATION OF WOUND VAC;  Surgeon: Cammy Copa, MD;  Location: [facility] OR;  Service: Orthopedics;  Laterality: Left;  . APPLICATION OF WOUND VAC Left 10/09/2015   Procedure: APPLICATION OF WOUND VAC;  Surgeon: Myrene Galas, MD;  Location: [facility] OR;  Service: Orthopedics;  Laterality: Left;  . EXTERNAL FIXATION LEG Left 10/05/2015   Procedure: EXTERNAL FIXATION LEG LEFT ANKLE & LEFT LOWER LEG;  Surgeon: Cammy Copa, MD;  Location: [facility] OR;  Service: Orthopedics;  Laterality: Left;  . EXTERNAL FIXATION REMOVAL Left 12/04/2015   Procedure: REMOVAL EXTERNAL FIXATION  LEFT LEG;  Surgeon: Myrene Galas, MD;  Location: [facility] OR;  Service: Orthopedics;  Laterality: Left;  . I & D EXTREMITY Left 10/05/2015   Procedure: IRRIGATION AND DEBRIDEMENT EXTREMITY LEFT ANKLE & LEFT  LOWER LEG,PLACEMENT OF ANTIBIOTIC BEADS;  Surgeon: Cammy Copa, MD;  Location: [facility] OR;  Service: Orthopedics;  Laterality: Left;  . I & D EXTREMITY Left 10/09/2015   Procedure: IRRIGATION AND DEBRIDEMENT LEFT ANKLE POSSIBLE EX-FIX ADJUSTMENT;  Surgeon: Myrene Galas, MD;  Location: [facility] OR;  Service: Orthopedics;  Laterality: Left;  . I & D EXTREMITY Left  07/27/2016   Procedure: IRRIGATION AND DEBRIDEMENT EXTREMITY;  Surgeon: Sheral Apley, MD;  Location: [facility] OR;  Service: Orthopedics;  Laterality: Left;  . LEG SURGERY     "rod in my right leg"  . MANDIBLE FRACTURE SURGERY    . NO PAST SURGERIES    . SKIN SPLIT GRAFT Left 12/04/2015   Procedure: SKIN GRAFT SPLIT THICKNESS LEFT LEG;  Surgeon: Myrene Galas, MD;  Location: [facility] OR;  Service: Orthopedics;  Laterality: Left;  . TIBIA IM NAIL INSERTION Right 07/31/2012   Procedure: INTRAMEDULLARY (IM) NAIL TIBIAL;  Surgeon: Nadara Mustard, MD;  Location: [facility] OR;  Service: Orthopedics;  Laterality: Right;  Intramedullary Nail right tib/fib       Family History  Problem Relation Age of Onset  . Hypertension Mother     Social History   Tobacco Use  . Smoking status: Current Every Day Smoker    Packs/day: 0.50    Types: Cigarettes  . Smokeless tobacco: Never Used  Substance Use Topics  . Alcohol use: Yes    Alcohol/week: 2.0 standard drinks    Types: 2 Cans of beer per week  . Drug use: No    Home Medications Prior to Admission medications   Medication Sig Start Date End Date Taking? Authorizing Provider  doxycycline (VIBRAMYCIN) 100 MG capsule Take 1 capsule (100 mg total) by mouth 2 (two) times daily for 7 days. 04/30/19 05/07/19  Maxwell Caul, PA-C  HYDROcodone-acetaminophen (NORCO/VICODIN) 5-325 MG tablet Take 1 tablet by mouth every 6 (six) hours as needed for severe pain. 12/14/18   Zadie Rhine, MD  naproxen (NAPROSYN) 500 MG tablet Take 1 tablet (500 mg total) by mouth 2 (two) times daily. 12/16/18   Dione Booze, MD    Allergies    Ciprofloxacin  Review of Systems   Review of Systems  Skin: Positive for wound.  Neurological: Negative for weakness and numbness.  All other systems reviewed and are negative.   Physical Exam Updated Vital Signs BP (!) 138/97 (BP Location: Left Arm)   Pulse (!) 101   Temp 98.9 F (37.2 C) (Oral)   Resp 15   Ht 5' 9.5" (1.765 m)    Wt 82.6 kg   SpO2 98%   BMI 26.49 kg/m   Physical Exam Vitals and nursing note reviewed.  Constitutional:      Appearance: Normal appearance. He is well-developed.  HENT:     Head: Normocephalic and atraumatic.      Comments: Hematoma noted to the right frontal head with active arterial bleeding noted.  No underlying skull deformity or crepitus noted. Eyes:     General: Lids are normal.     Conjunctiva/sclera: Conjunctivae normal.     Pupils: Pupils are equal, round, and reactive to light.     Comments: PERRL. EOMs intact. No nystagmus. No neglect.   Cardiovascular:     Rate and Rhythm: Normal rate  and regular rhythm.     Pulses: Normal pulses.     Heart sounds: Normal heart sounds. No murmur. No friction rub. No gallop.   Pulmonary:     Effort: Pulmonary effort is normal.     Breath sounds: Normal breath sounds.  Abdominal:     Palpations: Abdomen is soft. Abdomen is not rigid.     Tenderness: There is no abdominal tenderness. There is no guarding.  Musculoskeletal:        General: Normal range of motion.     Cervical back: Full passive range of motion without pain.  Skin:    General: Skin is warm and dry.     Capillary Refill: Capillary refill takes less than 2 seconds.     Comments: He is a hematoma noted to the right frontal forehead with a 2 cm opening that is actively bleeding.  No underlying skull deformity or crepitus noted.  Neurological:     Mental Status: He is alert and oriented to person, place, and time.     Comments: Cranial nerves III-XII intact Follows commands, Moves all extremities  5/5 strength to BUE and BLE  Sensation intact throughout all major nerve distributions No gait abnormalities  No slurred speech. No facial droop.  Alert and oriented x3.  Psychiatric:        Speech: Speech normal.     ED Results / Procedures / Treatments   Labs (all labs ordered are listed, but only abnormal results are displayed) Labs Reviewed - No data to  display  EKG None  Radiology No results found.  Procedures .Marland KitchenLaceration Repair  Date/Time: 04/30/2019 12:30 AM Performed by: Maxwell Caul, PA-C Authorized by: Maxwell Caul, PA-C   Consent:    Consent obtained:  Verbal   Consent given by:  Patient   Risks discussed:  Infection, need for additional repair, pain, poor cosmetic result and poor wound healing   Alternatives discussed:  No treatment and delayed treatment Universal protocol:    Procedure explained and questions answered to patient or proxy's satisfaction: yes     Relevant documents present and verified: yes     Test results available and properly labeled: yes     Imaging studies available: yes     Required blood products, implants, devices, and special equipment available: yes     Site/side marked: yes     Immediately prior to procedure, a time out was called: yes     Patient identity confirmed:  Verbally with patient Anesthesia (see MAR for exact dosages):    Anesthesia method:  Local infiltration   Local anesthetic:  Lidocaine 1% WITH epi Laceration details:    Location:  Scalp   Scalp location:  Frontal   Length (cm):  2 Repair type:    Repair type:  Simple Pre-procedure details:    Preparation:  Patient was prepped and draped in usual sterile fashion Exploration:    Hemostasis achieved with:  Direct pressure   Wound exploration: wound explored through full range of motion   Treatment:    Area cleansed with:  Betadine   Amount of cleaning:  Extensive   Irrigation solution:  Sterile saline   Irrigation method:  Syringe   Visualized foreign bodies/material removed: no   Skin repair:    Repair method:  Sutures   Suture size:  5-0   Suture material:  Nylon   Suture technique:  Simple interrupted   Number of sutures:  4 Approximation:    Approximation:  Close  Post-procedure details:    Dressing:  Antibiotic ointment   Patient tolerance of procedure:  Tolerated well, no immediate  complications   (including critical care time)  Medications Ordered in ED Medications  lidocaine-EPINEPHrine (XYLOCAINE W/EPI) 2 %-1:200000 (PF) injection (  Not Given 04/29/19 2331)  lidocaine-EPINEPHrine (XYLOCAINE-EPINEPHrine) 1 %-1:200000 (PF) injection 10 mL (10 mLs Intradermal Given by Other 04/29/19 2331)  doxycycline (VIBRA-TABS) tablet 100 mg (100 mg Oral Given 04/30/19 0029)    ED Course  I have reviewed the triage vital signs and the nursing notes.  Pertinent labs & imaging results that were available during my care of the patient were reviewed by me and considered in my medical decision making (see chart for details).    MDM Rules/Calculators/A&P                      34 year old male who presents for evaluation of head injury.  He does endorse drinking 1/5 of hose a Cuervo earlier [employer].  He reports that him and his wife were "playing around on bed" when he fell off the bed and hit the floor.  He had a previous wound with a hematoma and hit the wound.  No LOC.  He does report his tetanus is up-to-date.  Initially arrival, he is afebrile, nontoxic-appearing.  Vital signs are stable.  He is alert and oriented x3 and is able to follow commands.  No neuro deficits noted on exam.  Does have a hematoma with a 2 cm open wound that is actively bleeding.   On evaluation, there was arterial bleeding that was pulsating.  This was stopped with lidocaine with epinephrine.  The wound was explored and showed a hematoma which is likely from his previous injury.  This was evacuated and the wound was thoroughly extensively cleaned out.  The hematoma was removed.  Given the continued bleeding, the area was closed off using nylon sutures.  Given that this has been an open wound, will plan to put him on antibiotics.  Patient is understandable.  He states he is only allergic to ciprofloxacin.  Head CT shows no acute intracranial process.  There is soft tissue contusion/laceration but no evidence of  calvarial fracture.  Patient is able to answer questions appropriately.  He is calling a friend for him to pick him up.  Patient will be discharged in the company of his friend to drive him home. At this time, patient exhibits no emergent life-threatening condition that require further evaluation in ED or admission. Patient had ample opportunity for questions and discussion. All patient's questions were answered with full understanding.  Strict return precautions discussed. Patient expresses understanding and agreement to plan.   Portions of this note were generated with [employer]. Dictation errors may occur despite best attempts at proofreading.   Final Clinical Impression(s) / ED Diagnoses Final diagnoses:  Alcoholic intoxication without complication ([place])  Laceration of scalp, initial encounter    Rx / DC Orders ED Discharge Orders         Ordered    doxycycline (VIBRAMYCIN) 100 MG capsule  2 times daily     04/30/19 0044           Volanda Napoleon, PA-C 04/30/19 0108    Deno Etienne, DO 04/30/19 0119

## 2019-04-30 ENCOUNTER — Emergency Department (HOSPITAL_COMMUNITY): Payer: Medicaid Other

## 2019-04-30 MED ORDER — DOXYCYCLINE HYCLATE 100 MG PO CAPS: 100.0000 mg | ORAL_CAPSULE | Freq: Two times a day (BID) | ORAL | 0 refills | Status: AC

## 2019-04-30 NOTE — Discharge Instructions (Signed)
Keep the wound clean and dry for the first 24 hours. After that you may gently clean the wound with soap and water. Make sure to pat dry the wound before covering it with any dressing. You can use topical antibiotic ointment and bandage. Ice and elevate for pain relief.   You can take Tylenol or Ibuprofen as directed for pain. You can alternate Tylenol and Ibuprofen every 4 hours for additional pain relief.   Take antibiotics as directed. Please take all of your antibiotics until finished.  Return to the Emergency Department, your primary care doctor, or the [facility] in 7 days for suture removal.   Monitor closely for any signs of infection. Return to the Emergency Department for any worsening redness/swelling of the area that begins to spread, drainage from the site, worsening pain, fever or any other worsening or concerning symptoms.

## 2019-04-30 NOTE — ED Notes (Addendum)
Pt got up from stretcher and walked out the ems door.  Dr Adela Lank made aware

## 2019-05-27 ENCOUNTER — Other Ambulatory Visit: Payer: Self-pay

## 2019-05-27 ENCOUNTER — Encounter (HOSPITAL_COMMUNITY): Payer: Self-pay

## 2019-05-27 ENCOUNTER — Emergency Department (HOSPITAL_COMMUNITY)
Admission: EM | Admit: 2019-05-27 | Discharge: 2019-05-27 | Disposition: A | Discharge status: Home / Self Care | Payer: Medicaid Other | Attending: Emergency Medicine | Admitting: Emergency Medicine

## 2019-05-27 DIAGNOSIS — Z79899 Other long term (current) drug therapy: Secondary | ICD-10-CM | POA: Insufficient documentation

## 2019-05-27 DIAGNOSIS — Y929 Unspecified place or not applicable: Secondary | ICD-10-CM | POA: Insufficient documentation

## 2019-05-27 DIAGNOSIS — Z89612 Acquired absence of left leg above knee: Secondary | ICD-10-CM | POA: Insufficient documentation

## 2019-05-27 DIAGNOSIS — X58XXXA Exposure to other specified factors, initial encounter: Secondary | ICD-10-CM | POA: Diagnosis not present

## 2019-05-27 DIAGNOSIS — F17210 Nicotine dependence, cigarettes, uncomplicated: Secondary | ICD-10-CM | POA: Insufficient documentation

## 2019-05-27 DIAGNOSIS — Y9389 Activity, other specified: Secondary | ICD-10-CM | POA: Diagnosis not present

## 2019-05-27 DIAGNOSIS — T148XXA Other injury of unspecified body region, initial encounter: Secondary | ICD-10-CM

## 2019-05-27 DIAGNOSIS — I10 Essential (primary) hypertension: Secondary | ICD-10-CM | POA: Insufficient documentation

## 2019-05-27 DIAGNOSIS — Y999 Unspecified external cause status: Secondary | ICD-10-CM | POA: Insufficient documentation

## 2019-05-27 DIAGNOSIS — S21112A Laceration without foreign body of left front wall of thorax without penetration into thoracic cavity, initial encounter: Secondary | ICD-10-CM | POA: Diagnosis not present

## 2019-05-27 DIAGNOSIS — S299XXA Unspecified injury of thorax, initial encounter: Secondary | ICD-10-CM | POA: Diagnosis present

## 2019-05-27 MED ORDER — LIDOCAINE-EPINEPHRINE (PF) 1 %-1:200000 IJ SOLN: 10.0000 mL | Freq: Once | INTRAMUSCULAR | Status: DC

## 2019-05-27 MED ORDER — LIDOCAINE-EPINEPHRINE (PF) 2 %-1:200000 IJ SOLN
INTRAMUSCULAR | Status: AC
  Filled 2019-05-27: qty 20

## 2019-05-27 NOTE — ED Triage Notes (Signed)
Pt has an old wound to the left chest that had stitches in it, states he never got the stitches removed but tonight he noticed a stitch and pulled on it.  The wound started bleeding from one small pinpoint hole.

## 2019-05-27 NOTE — Discharge Instructions (Signed)
Your stitches should come out in the next couple of weeks, please see your doctor to have them taken out, do not take your own stitches out

## 2019-05-27 NOTE — ED Notes (Addendum)
In by EMS after noticing a suture in his L upper chest   He and his girlfriend pulled it and now it is bleeding through a small pinhole  Pressure dressing applied

## 2019-05-27 NOTE — ED Provider Notes (Signed)
[facility] EMERGENCY DEPARTMENT Provider Note   CSN: [id number] Arrival date & time: 05/27/19  2119     History Chief Complaint  Patient presents with  . Laceration    Kolbey Teichert Hefel is a 34 y.o. male.  HPI   This patient is a 34 year old male, he has a history of alcohol abuse, has had multiple injuries in the past, presents after taking stitches out of his chest and having some bleeding from his left upper chest.  He has no pain, the bleeding is a small amount, there is no other symptoms  Past Medical History:  Diagnosis Date  . 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 Displaced pilon fracture of left tibia, subsequent encounter for open fracture type IIIA, IIIB, or IIIC with malunion 03/06/2016  . HTN (hypertension) 12/05/2015   pt denies this  . Medical history non-contributory   . Nicotine dependence 10/12/2015  . Seizures ([place])    due to a reaction from Cipro  . Type III open fracture of left tibial plafond with involvement of fibula 10/12/2015    Patient Active Problem List   Diagnosis Date Noted  . History of left below knee amputation ([place]) 08/12/2017  . Osteomyelitis ([place]) 08/03/2017  . MRSA bacteremia 08/03/2017  . 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 Left leg swelling 07/30/2017  . Abscess of left leg 07/28/2016  . Left leg cellulitis 07/26/2016  . Hypokalemia 07/26/2016  . 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 Displaced pilon fracture of left tibia, subsequent encounter for open fracture type IIIA, IIIB, or IIIC with malunion 03/06/2016  . 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 HTN (hypertension) 12/05/2015  . Pin tract infection ([place]) 12/05/2015  . Open fracture of tibial plafond 12/04/2015  . Type III open fracture of left tibial plafond with involvement of fibula 10/12/2015  . Alcohol abuse 10/12/2015  . Nicotine dependence 10/12/2015    Past Surgical History:    Procedure Laterality Date  . AMPUTATION Left 08/02/2017   Procedure: AMPUTATION BELOW KNEE;  Surgeon: Newt Minion, MD;  Location: [place];  Service: Orthopedics;  Laterality: Left;  . ANKLE FUSION Left 02/12/2016   Procedure: ARTHRODESIS ANKLE;  Surgeon: Altamese [place], MD;  Location: [place];  Service: Orthopedics;  Laterality: Left;  . APPLICATION OF WOUND VAC Left 10/05/2015   Procedure: APPLICATION OF WOUND VAC;  Surgeon: Meredith Pel, MD;  Location: [place];  Service: Orthopedics;  Laterality: Left;  . APPLICATION OF WOUND VAC Left 10/09/2015   Procedure: APPLICATION OF WOUND VAC;  Surgeon: Altamese [place], MD;  Location: [place];  Service: Orthopedics;  Laterality: Left;  . EXTERNAL FIXATION LEG Left 10/05/2015   Procedure: EXTERNAL FIXATION LEG LEFT ANKLE & LEFT LOWER LEG;  Surgeon: Meredith Pel, MD;  Location: [place];  Service: Orthopedics;  Laterality: Left;  . EXTERNAL FIXATION REMOVAL Left 12/04/2015   Procedure: REMOVAL EXTERNAL FIXATION LEFT LEG;  Surgeon: Altamese [place], MD;  Location: [place];  Service: Orthopedics;  Laterality: Left;  . I & D EXTREMITY Left 10/05/2015   Procedure: IRRIGATION AND DEBRIDEMENT EXTREMITY LEFT ANKLE & LEFT  LOWER LEG,PLACEMENT OF ANTIBIOTIC BEADS;  Surgeon: Meredith Pel, MD;  Location: [place];  Service: Orthopedics;  Laterality: Left;  . I & D EXTREMITY Left 10/09/2015   Procedure: IRRIGATION AND DEBRIDEMENT LEFT ANKLE POSSIBLE EX-FIX ADJUSTMENT;  Surgeon: Altamese , MD;  Location: [place];  Service: Orthopedics;  Laterality: Left;  . I & D EXTREMITY Left 07/27/2016   Procedure: IRRIGATION AND DEBRIDEMENT EXTREMITY;  Surgeon: Sheral Apley, MD;  Location: [facility] OR;  Service: Orthopedics;  Laterality: Left;  . LEG SURGERY     "rod in my right leg"  . MANDIBLE FRACTURE SURGERY    . NO PAST SURGERIES    . SKIN SPLIT GRAFT Left 12/04/2015   Procedure: SKIN GRAFT SPLIT THICKNESS LEFT LEG;  Surgeon: Myrene Galas, MD;  Location: [facility] OR;  Service: Orthopedics;   Laterality: Left;  . TIBIA IM NAIL INSERTION Right 07/31/2012   Procedure: INTRAMEDULLARY (IM) NAIL TIBIAL;  Surgeon: Nadara Mustard, MD;  Location: [facility] OR;  Service: Orthopedics;  Laterality: Right;  Intramedullary Nail right tib/fib       Family History  Problem Relation Age of Onset  . Hypertension Mother     Social History   Tobacco Use  . Smoking status: Current Every Day Smoker    Packs/day: 0.50    Types: Cigarettes  . Smokeless tobacco: Never Used  Substance Use Topics  . Alcohol use: Yes    Alcohol/week: 2.0 standard drinks    Types: 2 Cans of beer per week  . Drug use: No    Home Medications Prior to Admission medications   Medication Sig Start Date End Date Taking? Authorizing Provider  HYDROcodone-acetaminophen (NORCO/VICODIN) 5-325 MG tablet Take 1 tablet by mouth every 6 (six) hours as needed for severe pain. 12/14/18   Zadie Rhine, MD  naproxen (NAPROSYN) 500 MG tablet Take 1 tablet (500 mg total) by mouth 2 (two) times daily. 12/16/18   Dione Booze, MD    Allergies    Ciprofloxacin  Review of Systems   Review of Systems  Respiratory: Negative for shortness of breath.   Cardiovascular: Negative for chest pain.  Skin: Positive for wound.    Physical Exam Updated Vital Signs BP (!) 137/98 (BP Location: Right Arm)   Pulse 88   Temp 98.2 F (36.8 C) (Oral)   Resp 15   Ht 1.753 m (5\' 9" )   Wt 77.1 kg   SpO2 99%   BMI 25.10 kg/m   Physical Exam Vitals and nursing note reviewed.  Constitutional:      Appearance: He is well-developed. He is not diaphoretic.  HENT:     Head: Normocephalic and atraumatic.  Eyes:     General:        Right eye: No discharge.        Left eye: No discharge.     Conjunctiva/sclera: Conjunctivae normal.  Cardiovascular:     Comments: Chest wall - with small bleeding around the stitch removal site Pulmonary:     Effort: Pulmonary effort is normal. No respiratory distress.  Skin:    General: Skin is warm and  dry.     Findings: No erythema or rash.  Neurological:     Mental Status: He is alert.     Coordination: Coordination normal.     Comments: Normal speech     ED Results / Procedures / Treatments   Labs (all labs ordered are listed, but only abnormal results are displayed) Labs Reviewed - No data to display  EKG None  Radiology No results found.  Procedures . Laceration Repair  Date/Time: 05/27/2019 10:03 PM Performed by: 05/29/2019, MD Authorized by: Eber Hong, MD   Consent:    Consent obtained:  Verbal   Consent given by:  Patient   Risks discussed:  Infection,  pain, need for additional repair, poor cosmetic result and poor wound healing   Alternatives discussed:  No treatment and delayed treatment Anesthesia (see MAR for exact dosages):    Anesthesia method:  Local infiltration   Local anesthetic:  Lidocaine 1% WITH epi Laceration details:    Location:  Trunk   Trunk location:  L chest   Length (cm):  0.2   Depth (mm):  1 Repair type:    Repair type:  Simple Pre-procedure details:    Preparation:  Patient was prepped and draped in usual sterile fashion and imaging obtained to evaluate for foreign bodies Exploration:    Hemostasis achieved with:  Direct pressure   Wound exploration: wound explored through full range of motion and entire depth of wound probed and visualized     Wound extent: no fascia violation noted, no foreign bodies/material noted, no muscle damage noted, no nerve damage noted, no tendon damage noted, no underlying fracture noted and no vascular damage noted   Treatment:    Area cleansed with:  Betadine   Amount of cleaning:  Standard   Irrigation solution:  Sterile saline   Irrigation method:  Syringe Skin repair:    Repair method:  Sutures   Suture size:  4-0   Suture material:  Prolene   Suture technique:  Simple interrupted and horizontal mattress   Number of sutures:  2 Approximation:    Approximation:  Close Post-procedure  details:    Dressing:  Antibiotic ointment and sterile dressing   Patient tolerance of procedure:  Tolerated well, no immediate complications Comments:     The patient had 2 sutures placed, one was Prolene, one was Vicryl Bleeding stopped    (including critical care time)  Medications Ordered in ED Medications  lidocaine-EPINEPHrine (XYLOCAINE-EPINEPHrine) 1 %-1:200000 (PF) injection 10 mL (has no administration in time range)  lidocaine-EPINEPHrine (XYLOCAINE W/EPI) 2 %-1:200000 (PF) injection (has no administration in time range)    ED Course  I have reviewed the triage vital signs and the nursing notes.  Pertinent labs & imaging results that were available during my care of the patient were reviewed by me and considered in my medical decision making (see chart for details).    MDM Rules/Calculators/A&P                      I had to place a pursestring suture around the bleeding site as well as another suture of Vicryl in a horizontal mattress, this eventually caused the bleeding to stop.  This is under tension  Patient given written instructions, he continues to be somewhat chronically belligerent but happy, hopefully he will return as indicated  Final Clinical Impression(s) / ED Diagnoses Final diagnoses:  Bleeding from wound    Rx / DC Orders ED Discharge Orders    None       Eber Hong, MD 05/27/19 2204

## 2019-08-12 ENCOUNTER — Encounter (HOSPITAL_COMMUNITY): Payer: Self-pay

## 2019-08-12 ENCOUNTER — Emergency Department (HOSPITAL_COMMUNITY)
Admission: EM | Admit: 2019-08-12 | Discharge: 2019-08-12 | Disposition: A | Discharge status: Home / Self Care | Payer: Medicaid Other | Attending: Emergency Medicine | Admitting: Emergency Medicine

## 2019-08-12 ENCOUNTER — Other Ambulatory Visit: Payer: Self-pay

## 2019-08-12 DIAGNOSIS — F17210 Nicotine dependence, cigarettes, uncomplicated: Secondary | ICD-10-CM | POA: Insufficient documentation

## 2019-08-12 DIAGNOSIS — I10 Essential (primary) hypertension: Secondary | ICD-10-CM | POA: Diagnosis not present

## 2019-08-12 DIAGNOSIS — L03115 Cellulitis of right lower limb: Secondary | ICD-10-CM | POA: Insufficient documentation

## 2019-08-12 DIAGNOSIS — M79661 Pain in right lower leg: Secondary | ICD-10-CM | POA: Diagnosis present

## 2019-08-12 MED ORDER — SULFAMETHOXAZOLE-TRIMETHOPRIM 800-160 MG PO TABS
1.0000 | ORAL_TABLET | Freq: Once | ORAL | Status: AC
  Administered 2019-08-12: 1 via ORAL
  Filled 2019-08-12: qty 1

## 2019-08-12 MED ORDER — SULFAMETHOXAZOLE-TRIMETHOPRIM 800-160 MG PO TABS: 1.0000 | ORAL_TABLET | Freq: Two times a day (BID) | ORAL | 0 refills | Status: AC

## 2019-08-12 MED ORDER — CEPHALEXIN 500 MG PO CAPS
500.0000 mg | ORAL_CAPSULE | Freq: Once | ORAL | Status: AC
  Administered 2019-08-12: 500 mg via ORAL
  Filled 2019-08-12: qty 1

## 2019-08-12 MED ORDER — CEPHALEXIN 500 MG PO CAPS: 500.0000 mg | ORAL_CAPSULE | Freq: Three times a day (TID) | ORAL | 0 refills | Status: DC

## 2019-08-12 NOTE — ED Triage Notes (Signed)
Pt fell last week and got an abrasion to right shin. Pt now has an open, draining wound to shin. He also has lower leg swelling. Denies any recent fevers or chills.

## 2019-08-12 NOTE — ED Provider Notes (Signed)
[facility] EMERGENCY DEPARTMENT Provider Note   CSN: [id number] Arrival date & time: 08/12/19  2585     History Chief Complaint  Patient presents with  . Leg Pain    Damon Alexander is a 34 y.o. male.  HPI   34 year old male presenting for evaluation of pain/wound to his right shin.  He sustained an abrasion to his right shin about a week ago.  Last several days he has developed a deeper wound with some purulent drainage.  He has been applying hydrogen peroxide and alcohol to clean it.  He feels like it is progressing despite this.  No fevers or chills.  Past Medical History:  Diagnosis Date  . 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 Displaced pilon fracture of left tibia, subsequent encounter for open fracture type IIIA, IIIB, or IIIC with malunion 03/06/2016  . HTN (hypertension) 12/05/2015   pt denies this  . Medical history non-contributory   . Nicotine dependence 10/12/2015  . 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  . Type III open fracture of left tibial plafond with involvement of fibula 10/12/2015    Patient Active Problem List   Diagnosis Date Noted  . History of left below knee amputation ([facility]) 08/12/2017  . Osteomyelitis ([facility]) 08/03/2017  . MRSA bacteremia 08/03/2017  . 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 Left leg swelling 07/30/2017  . Abscess of left leg 07/28/2016  . Left leg cellulitis 07/26/2016  . Hypokalemia 07/26/2016  . 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 Displaced pilon fracture of left tibia, subsequent encounter for open fracture type IIIA, IIIB, or IIIC with malunion 03/06/2016  . 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 HTN (hypertension) 12/05/2015  . 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 Open fracture of tibial plafond 12/04/2015  . Type III open fracture of left tibial plafond with involvement of fibula 10/12/2015  . Alcohol abuse 10/12/2015  .  Nicotine dependence 10/12/2015    Past Surgical History:  Procedure Laterality Date  . AMPUTATION Left 08/02/2017   Procedure: AMPUTATION BELOW KNEE;  Surgeon: Nadara Mustard, MD;  Location: [facility] OR;  Service: Orthopedics;  Laterality: Left;  . ANKLE FUSION Left 02/12/2016   Procedure: ARTHRODESIS ANKLE;  Surgeon: Myrene Galas, MD;  Location: [facility] OR;  Service: Orthopedics;  Laterality: Left;  . APPLICATION OF WOUND VAC Left 10/05/2015   Procedure: APPLICATION OF WOUND VAC;  Surgeon: Cammy Copa, MD;  Location: [facility] OR;  Service: Orthopedics;  Laterality: Left;  . APPLICATION OF WOUND VAC Left 10/09/2015   Procedure: APPLICATION OF WOUND VAC;  Surgeon: Myrene Galas, MD;  Location: [facility] OR;  Service: Orthopedics;  Laterality: Left;  . EXTERNAL FIXATION LEG Left 10/05/2015   Procedure: EXTERNAL FIXATION LEG LEFT ANKLE & LEFT LOWER LEG;  Surgeon: Cammy Copa, MD;  Location: [facility] OR;  Service: Orthopedics;  Laterality: Left;  . EXTERNAL FIXATION REMOVAL Left 12/04/2015   Procedure: REMOVAL EXTERNAL FIXATION LEFT LEG;  Surgeon: Myrene Galas, MD;  Location: [facility] OR;  Service: Orthopedics;  Laterality: Left;  . I & D EXTREMITY Left 10/05/2015   Procedure: IRRIGATION AND DEBRIDEMENT EXTREMITY LEFT ANKLE & LEFT  LOWER LEG,PLACEMENT OF ANTIBIOTIC BEADS;  Surgeon: Cammy Copa, MD;  Location: [facility] OR;  Service: Orthopedics;  Laterality: Left;  . I & D EXTREMITY Left 10/09/2015   Procedure: IRRIGATION AND DEBRIDEMENT LEFT ANKLE POSSIBLE EX-FIX  ADJUSTMENT;  Surgeon: Myrene Galas, MD;  Location: [facility] OR;  Service: Orthopedics;  Laterality: Left;  . I & D EXTREMITY Left 07/27/2016   Procedure: IRRIGATION AND DEBRIDEMENT EXTREMITY;  Surgeon: Sheral Apley, MD;  Location: [facility] OR;  Service: Orthopedics;  Laterality: Left;  . LEG SURGERY     "rod in my right leg"  . MANDIBLE FRACTURE SURGERY    . NO PAST SURGERIES    . SKIN SPLIT GRAFT Left 12/04/2015   Procedure: SKIN GRAFT SPLIT THICKNESS LEFT LEG;  Surgeon:  Myrene Galas, MD;  Location: [facility] OR;  Service: Orthopedics;  Laterality: Left;  . TIBIA IM NAIL INSERTION Right 07/31/2012   Procedure: INTRAMEDULLARY (IM) NAIL TIBIAL;  Surgeon: Nadara Mustard, MD;  Location: [facility] OR;  Service: Orthopedics;  Laterality: Right;  Intramedullary Nail right tib/fib       Family History  Problem Relation Age of Onset  . Hypertension Mother     Social History   Tobacco Use  . Smoking status: Current Every Day Smoker    Packs/day: 0.50    Types: Cigarettes  . Smokeless tobacco: Never Used  Vaping Use  . Vaping Use: Never used  Substance Use Topics  . Alcohol use: Yes    Alcohol/week: 2.0 standard drinks    Types: 2 Cans of beer per week  . Drug use: No    Home Medications Prior to Admission medications   Medication Sig Start Date End Date Taking? Authorizing Provider  cephALEXin (KEFLEX) 500 MG capsule Take 1 capsule (500 mg total) by mouth 3 (three) times daily. 08/12/19   Raeford Razor, MD  HYDROcodone-acetaminophen (NORCO/VICODIN) 5-325 MG tablet Take 1 tablet by mouth every 6 (six) hours as needed for severe pain. 12/14/18   Zadie Rhine, MD  naproxen (NAPROSYN) 500 MG tablet Take 1 tablet (500 mg total) by mouth 2 (two) times daily. 12/16/18   Dione Booze, MD  sulfamethoxazole-trimethoprim (BACTRIM DS) 800-160 MG tablet Take 1 tablet by mouth 2 (two) times daily for 7 days. 08/12/19 08/19/19  Raeford Razor, MD    Allergies    Ciprofloxacin  Review of Systems   Review of Systems All systems reviewed and negative, other than as noted in HPI.  Physical Exam Updated Vital Signs BP (!) 152/104 (BP Location: Right Arm)   Pulse 72   Temp 98.6 F (37 C) (Oral)   Resp 14   Ht 5\' 9"  (1.753 m)   Wt 72.6 kg   SpO2 100%   BMI 23.63 kg/m   Physical Exam Vitals and nursing note reviewed.  Constitutional:      General: He is not in acute distress.    Appearance: He is well-developed.  HENT:     Head: Normocephalic and atraumatic.  Eyes:       General:        Right eye: No discharge.        Left eye: No discharge.     Conjunctiva/sclera: Conjunctivae normal.  Cardiovascular:     Rate and Rhythm: Normal rate and regular rhythm.     Heart sounds: Normal heart sounds. No murmur heard.  No friction rub. No gallop.   Pulmonary:     Effort: Pulmonary effort is normal. No respiratory distress.     Breath sounds: Normal breath sounds.  Abdominal:     General: There is no distension.     Palpations: Abdomen is soft.     Tenderness: There is no abdominal tenderness.  Musculoskeletal:  Cervical back: Neck supple.     Comments: Nickel sized ulceration to the mid aspect of the right shin.  There is some purulent drainage.  Does not probe deeply.  Minimal surrounding cellulitis.  Skin:    General: Skin is warm and dry.  Neurological:     Mental Status: He is alert.  Psychiatric:        Behavior: Behavior normal.        Thought Content: Thought content normal.     ED Results / Procedures / Treatments   Labs (all labs ordered are listed, but only abnormal results are displayed) Labs Reviewed - No data to display  EKG None  Radiology No results found.  Procedures Procedures (including critical care time)  Medications Ordered in ED Medications  cephALEXin (KEFLEX) capsule 500 mg (has no administration in time range)  sulfamethoxazole-trimethoprim (BACTRIM DS) 800-160 MG per tablet 1 tablet (has no administration in time range)    ED Course  I have reviewed the triage vital signs and the nursing notes.  Pertinent labs & imaging results that were available during my care of the patient were reviewed by me and considered in my medical decision making (see chart for details).    MDM Rules/Calculators/A&P                          35 year old male with an ulcerated wound to his right mid shin.  At this point it seems pretty superficial.  There is some purulent drainage and prep some mild surrounding cellulitis.  No  abscess.  Will place on antibiotics.  Appropriate continued wound care and return precautions were discussed.  Final Clinical Impression(s) / ED Diagnoses Final diagnoses:  Cellulitis of right lower extremity    Rx / DC Orders ED Discharge Orders         Ordered    cephALEXin (KEFLEX) 500 MG capsule  3 times daily     Discontinue  Reprint     08/12/19 0958    sulfamethoxazole-trimethoprim (BACTRIM DS) 800-160 MG tablet  2 times daily     Discontinue  Reprint     08/12/19 6433           Raeford Razor, MD 08/12/19 1001

## 2020-01-19 ENCOUNTER — Other Ambulatory Visit: Payer: Self-pay

## 2020-01-19 ENCOUNTER — Other Ambulatory Visit: Payer: Medicaid Other

## 2020-01-19 DIAGNOSIS — Z20822 Contact with and (suspected) exposure to covid-19: Secondary | ICD-10-CM

## 2020-01-21 LAB — SPECIMEN STATUS REPORT

## 2020-01-21 LAB — NOVEL CORONAVIRUS, NAA: SARS-CoV-2, NAA: NOT DETECTED

## 2020-01-21 LAB — SARS-COV-2, NAA 2 DAY TAT

## 2020-06-10 ENCOUNTER — Encounter (HOSPITAL_COMMUNITY): Payer: Self-pay | Admitting: Emergency Medicine

## 2020-06-10 ENCOUNTER — Other Ambulatory Visit: Payer: Self-pay

## 2020-06-10 ENCOUNTER — Emergency Department (HOSPITAL_COMMUNITY)
Admission: EM | Admit: 2020-06-10 | Discharge: 2020-06-11 | Disposition: A | Discharge status: Home / Self Care | Payer: Medicaid Other | Attending: Emergency Medicine | Admitting: Emergency Medicine

## 2020-06-10 DIAGNOSIS — I10 Essential (primary) hypertension: Secondary | ICD-10-CM | POA: Insufficient documentation

## 2020-06-10 DIAGNOSIS — Z79899 Other long term (current) drug therapy: Secondary | ICD-10-CM | POA: Insufficient documentation

## 2020-06-10 DIAGNOSIS — F17210 Nicotine dependence, cigarettes, uncomplicated: Secondary | ICD-10-CM | POA: Diagnosis not present

## 2020-06-10 DIAGNOSIS — M25561 Pain in right knee: Secondary | ICD-10-CM | POA: Diagnosis present

## 2020-06-10 NOTE — ED Triage Notes (Signed)
Pt states he was awakened from sleep with R knee pain. Denies any injury. Pt has been drinking ETOH.

## 2020-06-11 ENCOUNTER — Encounter (HOSPITAL_COMMUNITY): Payer: Self-pay

## 2020-06-11 ENCOUNTER — Emergency Department (HOSPITAL_COMMUNITY): Payer: Medicaid Other

## 2020-06-11 LAB — CBC WITH DIFFERENTIAL/PLATELET
Abs Immature Granulocytes: 0.01 10*3/uL (ref 0.00–0.07)
Basophils Absolute: 0.2 10*3/uL — ABNORMAL HIGH (ref 0.0–0.1)
Basophils Relative: 3 %
Eosinophils Absolute: 0.1 10*3/uL (ref 0.0–0.5)
Eosinophils Relative: 2 %
HCT: 39.1 % (ref 39.0–52.0)
Hemoglobin: 12.9 g/dL — ABNORMAL LOW (ref 13.0–17.0)
Immature Granulocytes: 0 %
Lymphocytes Relative: 46 %
Lymphs Abs: 2 10*3/uL (ref 0.7–4.0)
MCH: 29.1 pg (ref 26.0–34.0)
MCHC: 33 g/dL (ref 30.0–36.0)
MCV: 88.1 fL (ref 80.0–100.0)
Monocytes Absolute: 0.6 10*3/uL (ref 0.1–1.0)
Monocytes Relative: 12 %
Neutro Abs: 1.7 10*3/uL (ref 1.7–7.7)
Neutrophils Relative %: 37 %
Platelets: 197 10*3/uL (ref 150–400)
RBC: 4.44 MIL/uL (ref 4.22–5.81)
RDW: 16.4 % — ABNORMAL HIGH (ref 11.5–15.5)
WBC: 4.5 10*3/uL (ref 4.0–10.5)
nRBC: 0 % (ref 0.0–0.2)

## 2020-06-11 LAB — BASIC METABOLIC PANEL
Anion gap: 15 (ref 5–15)
BUN: 10 mg/dL (ref 6–20)
CO2: 24 mmol/L (ref 22–32)
Calcium: 8.9 mg/dL (ref 8.9–10.3)
Chloride: 100 mmol/L (ref 98–111)
Creatinine, Ser: 0.72 mg/dL (ref 0.61–1.24)
GFR, Estimated: >60 mL/min (ref >60)
Glucose, Bld: 98 mg/dL (ref 70–99)
Potassium: 4 mmol/L (ref 3.5–5.1)
Sodium: 139 mmol/L (ref 135–145)

## 2020-06-11 LAB — SEDIMENTATION RATE: Sed Rate: 15 mm/hr (ref 0–16)

## 2020-06-11 MED ORDER — KETOROLAC TROMETHAMINE 60 MG/2ML IM SOLN
60.0000 mg | Freq: Once | INTRAMUSCULAR | Status: AC
  Administered 2020-06-11: 60 mg via INTRAMUSCULAR
  Filled 2020-06-11: qty 2

## 2020-06-11 MED ORDER — HYDROCODONE-ACETAMINOPHEN 5-325 MG PO TABS
2.0000 | ORAL_TABLET | Freq: Once | ORAL | Status: AC
  Administered 2020-06-11: 2 via ORAL
  Filled 2020-06-11: qty 2

## 2020-06-11 MED ORDER — HYDROCODONE-ACETAMINOPHEN 5-325 MG PO TABS: 1.0000 | ORAL_TABLET | Freq: Four times a day (QID) | ORAL | 0 refills | Status: DC | PRN

## 2020-06-11 MED ORDER — NAPROXEN 500 MG PO TABS: 500.0000 mg | ORAL_TABLET | Freq: Two times a day (BID) | ORAL | 0 refills | Status: DC

## 2020-06-11 NOTE — Discharge Instructions (Addendum)
Begin taking naproxen as prescribed.  Take hydrocodone as prescribed as needed for pain not relieved with naproxen.  Follow-up with your primary doctor if symptoms are not improving in the next few days, and return to the ER symptoms significantly worsen or change in the meantime.
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
## 2020-08-19 ENCOUNTER — Other Ambulatory Visit: Payer: Self-pay

## 2020-08-19 ENCOUNTER — Emergency Department (HOSPITAL_COMMUNITY)
Admission: EM | Admit: 2020-08-19 | Discharge: 2020-08-19 | Disposition: A | Discharge status: Home / Self Care | Payer: Medicaid Other | Attending: Emergency Medicine | Admitting: Emergency Medicine

## 2020-08-19 ENCOUNTER — Encounter (HOSPITAL_COMMUNITY): Payer: Self-pay

## 2020-08-19 DIAGNOSIS — I10 Essential (primary) hypertension: Secondary | ICD-10-CM | POA: Diagnosis not present

## 2020-08-19 DIAGNOSIS — F17210 Nicotine dependence, cigarettes, uncomplicated: Secondary | ICD-10-CM | POA: Insufficient documentation

## 2020-08-19 DIAGNOSIS — Z89512 Acquired absence of left leg below knee: Secondary | ICD-10-CM | POA: Diagnosis not present

## 2020-08-19 DIAGNOSIS — F10129 Alcohol abuse with intoxication, unspecified: Secondary | ICD-10-CM | POA: Insufficient documentation

## 2020-08-19 DIAGNOSIS — F10920 Alcohol use, unspecified with intoxication, uncomplicated: Secondary | ICD-10-CM

## 2020-08-19 NOTE — ED Provider Notes (Signed)
[facility] EMERGENCY DEPARTMENT Provider Note   CSN: [id number] Arrival date & time: 08/19/20  0137     History Chief Complaint  Patient presents with   Medical Clearance    Damon Alexander is a 35 y.o. male.  35 year old male brought in by [employer] secondary to alcohol intoxication.  They wanted medical clearance because alcohol also high.  Patient states he did drink has no headache, chest pain, back pain, abdominal pain this time.  Patient states that he feels fine.  He did not divulge how much alcohol he had.  Denied any drug use.       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/06/2016   Displaced pilon fracture of left tibia, subsequent encounter for open fracture type IIIA, IIIB, or IIIC with malunion 03/06/2016   HTN (hypertension) 12/05/2015   pt denies this   Medical history non-contributory    Nicotine dependence 10/12/2015   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   Type III open fracture of left tibial plafond with involvement of fibula 10/12/2015    Patient Active Problem List   Diagnosis Date Noted   History of left below knee amputation (HCC) 08/12/2017   Osteomyelitis (HCC) 08/03/2017   MRSA bacteremia 08/03/2017   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)    Left leg swelling 07/30/2017   Abscess of left leg 07/28/2016   Left leg cellulitis 07/26/2016   Hypokalemia 07/26/2016   Acquired subluxation of left ankle 03/06/2016   Displaced pilon fracture of left tibia, subsequent encounter for open fracture type IIIA, IIIB, or IIIC with malunion 03/06/2016   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, type III, with nonunion 02/12/2016   HTN (hypertension) 12/05/2015   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   Open fracture of tibial plafond 12/04/2015   Type III open fracture of left tibial plafond with involvement of fibula 10/12/2015   Alcohol abuse 10/12/2015   Nicotine dependence  10/12/2015    Past Surgical History:  Procedure Laterality Date   AMPUTATION Left 08/02/2017   Procedure: AMPUTATION BELOW KNEE;  Surgeon: Nadara Mustard, MD;  Location: [facility] OR;  Service: Orthopedics;  Laterality: Left;   ANKLE FUSION Left 02/12/2016   Procedure: ARTHRODESIS ANKLE;  Surgeon: Myrene Galas, MD;  Location: [facility] OR;  Service: Orthopedics;  Laterality: Left;   APPLICATION OF WOUND VAC Left 10/05/2015   Procedure: APPLICATION OF WOUND VAC;  Surgeon: Cammy Copa, MD;  Location: [facility] OR;  Service: Orthopedics;  Laterality: Left;   APPLICATION OF WOUND VAC Left 10/09/2015   Procedure: APPLICATION OF WOUND VAC;  Surgeon: Myrene Galas, MD;  Location: [facility] OR;  Service: Orthopedics;  Laterality: Left;   EXTERNAL FIXATION LEG Left 10/05/2015   Procedure: EXTERNAL FIXATION LEG LEFT ANKLE & LEFT LOWER LEG;  Surgeon: Cammy Copa, MD;  Location: [facility] OR;  Service: Orthopedics;  Laterality: Left;   EXTERNAL FIXATION REMOVAL Left 12/04/2015   Procedure: REMOVAL EXTERNAL FIXATION LEFT LEG;  Surgeon: Myrene Galas, MD;  Location: [facility] OR;  Service: Orthopedics;  Laterality: Left;   I & D EXTREMITY Left 10/05/2015   Procedure: IRRIGATION AND DEBRIDEMENT EXTREMITY LEFT ANKLE & LEFT  LOWER LEG,PLACEMENT OF ANTIBIOTIC BEADS;  Surgeon: Cammy Copa, MD;  Location: [facility] OR;  Service: Orthopedics;  Laterality: Left;   I & D EXTREMITY Left 10/09/2015   Procedure: IRRIGATION AND DEBRIDEMENT LEFT ANKLE POSSIBLE EX-FIX ADJUSTMENT;  Surgeon:  Myrene Galas, MD;  Location: [facility] OR;  Service: Orthopedics;  Laterality: Left;   I & D EXTREMITY Left 07/27/2016   Procedure: IRRIGATION AND DEBRIDEMENT EXTREMITY;  Surgeon: Sheral Apley, MD;  Location: [facility] OR;  Service: Orthopedics;  Laterality: Left;   LEG SURGERY     "rod in my right leg"   MANDIBLE FRACTURE SURGERY     NO PAST SURGERIES     SKIN SPLIT GRAFT Left 12/04/2015   Procedure: SKIN GRAFT SPLIT THICKNESS LEFT LEG;  Surgeon: Myrene Galas, MD;  Location: [facility]  OR;  Service: Orthopedics;  Laterality: Left;   TIBIA IM NAIL INSERTION Right 07/31/2012   Procedure: INTRAMEDULLARY (IM) NAIL TIBIAL;  Surgeon: Nadara Mustard, MD;  Location: [facility] OR;  Service: Orthopedics;  Laterality: Right;  Intramedullary Nail right tib/fib       Family History  Problem Relation Age of Onset   Hypertension Mother     Social History   Tobacco Use   Smoking status: Every Day    Packs/day: 0.50    Types: Cigarettes   Smokeless tobacco: Never  Vaping Use   Vaping Use: Never used  Substance Use Topics   Alcohol use: Yes    Alcohol/week: 2.0 standard drinks    Types: 2 Cans of beer per week   Drug use: No    Home Medications Prior to Admission medications   Medication Sig Start Date End Date Taking? Authorizing Provider  cephALEXin (KEFLEX) 500 MG capsule Take 1 capsule (500 mg total) by mouth 3 (three) times daily. 08/12/19   Raeford Razor, MD  DULoxetine (CYMBALTA) 60 MG capsule Take 60 mg by mouth daily. 12/15/19   [provider]  folic acid (FOLVITE) 1 MG tablet Take 1 mg by mouth daily. 03/16/20   [provider]  HYDROcodone-acetaminophen (NORCO) 5-325 MG tablet Take 1-2 tablets by mouth every 6 (six) hours as needed. 06/11/20   Geoffery Lyons, MD  magnesium oxide (MAG-OX) 400 (240 Mg) MG tablet Take 1 tablet by mouth 2 (two) times daily. 03/16/20   [provider]  naproxen (NAPROSYN) 500 MG tablet Take 1 tablet (500 mg total) by mouth 2 (two) times daily. 06/11/20   Geoffery Lyons, MD  olmesartan (BENICAR) 20 MG tablet Take 20 mg by mouth daily. 12/15/19   [provider]  thiamine (VITAMIN B-1) 100 MG tablet Take 100 mg by mouth daily. 03/16/20   [provider]    Allergies    Ciprofloxacin  Review of Systems   Review of Systems  All other systems reviewed and are negative.  Physical Exam Updated Vital Signs BP (!) 150/105 (BP Location: Left Arm)   Pulse 100   Temp 98.6 F (37 C) (Oral)   Resp 16   Ht 5'  10" (1.778 m)   Wt 81.6 kg   SpO2 98%   BMI 25.83 kg/m   Physical Exam Vitals and nursing note reviewed.  Constitutional:      Appearance: He is well-developed.  HENT:     Head: Normocephalic and atraumatic.     Mouth/Throat:     Mouth: Mucous membranes are moist.     Pharynx: Oropharynx is clear.  Cardiovascular:     Rate and Rhythm: Normal rate.  Pulmonary:     Effort: Pulmonary effort is normal. No respiratory distress.  Abdominal:     General: Abdomen is flat. There is no distension.  Musculoskeletal:        General: Deformity (Left BKA.) present. No swelling.  Normal range of motion.     Cervical back: Normal range of motion.  Skin:    General: Skin is warm and dry.     Coloration: Skin is not jaundiced or pale.  Neurological:     General: No focal deficit present.     Mental Status: He is alert.    ED Results / Procedures / Treatments   Labs (all labs ordered are listed, but only abnormal results are displayed) Labs Reviewed - No data to display  EKG None  Radiology No results found.  Procedures Procedures   Medications Ordered in ED Medications - No data to display  ED Course  I have reviewed the triage vital signs and the nursing notes.  Pertinent labs & imaging results that were available during my care of the patient were reviewed by me and considered in my medical decision making (see chart for details).    MDM Rules/Calculators/A&P                          Vital signs are within normal limits aside from slightly elevated blood pressure.  No evidence of acute intoxication or coingestants.  I have no reason to believe that this patient is in need of further medical attention will be discharged back to the custody of police department.  Final Clinical Impression(s) / ED Diagnoses Final diagnoses:  Alcoholic intoxication without complication [facility])    Rx / DC Orders ED Discharge Orders     None        Kerrie Timm, Barbara Cower, MD 08/19/20 [phone]
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
## 2020-10-06 ENCOUNTER — Emergency Department (HOSPITAL_COMMUNITY): Payer: Medicaid Other

## 2020-10-06 ENCOUNTER — Encounter (HOSPITAL_COMMUNITY): Payer: Self-pay

## 2020-10-06 ENCOUNTER — Other Ambulatory Visit: Payer: Self-pay

## 2020-10-06 ENCOUNTER — Emergency Department (HOSPITAL_COMMUNITY)
Admission: EM | Admit: 2020-10-06 | Discharge: 2020-10-06 | Disposition: A | Discharge status: Home / Self Care | Payer: Medicaid Other | Attending: Emergency Medicine | Admitting: Emergency Medicine

## 2020-10-06 DIAGNOSIS — M545 Low back pain, unspecified: Secondary | ICD-10-CM | POA: Diagnosis not present

## 2020-10-06 DIAGNOSIS — T07XXXA Unspecified multiple injuries, initial encounter: Secondary | ICD-10-CM

## 2020-10-06 DIAGNOSIS — F17210 Nicotine dependence, cigarettes, uncomplicated: Secondary | ICD-10-CM | POA: Insufficient documentation

## 2020-10-06 DIAGNOSIS — M542 Cervicalgia: Secondary | ICD-10-CM | POA: Insufficient documentation

## 2020-10-06 DIAGNOSIS — M546 Pain in thoracic spine: Secondary | ICD-10-CM | POA: Diagnosis not present

## 2020-10-06 DIAGNOSIS — S80211A Abrasion, right knee, initial encounter: Secondary | ICD-10-CM | POA: Insufficient documentation

## 2020-10-06 DIAGNOSIS — S8991XA Unspecified injury of right lower leg, initial encounter: Secondary | ICD-10-CM | POA: Diagnosis present

## 2020-10-06 DIAGNOSIS — M25571 Pain in right ankle and joints of right foot: Secondary | ICD-10-CM | POA: Diagnosis not present

## 2020-10-06 DIAGNOSIS — R519 Headache, unspecified: Secondary | ICD-10-CM | POA: Diagnosis not present

## 2020-10-06 DIAGNOSIS — Z23 Encounter for immunization: Secondary | ICD-10-CM | POA: Insufficient documentation

## 2020-10-06 DIAGNOSIS — S8011XA Contusion of right lower leg, initial encounter: Secondary | ICD-10-CM | POA: Insufficient documentation

## 2020-10-06 DIAGNOSIS — W01198A Fall on same level from slipping, tripping and stumbling with subsequent striking against other object, initial encounter: Secondary | ICD-10-CM | POA: Diagnosis not present

## 2020-10-06 DIAGNOSIS — M79604 Pain in right leg: Secondary | ICD-10-CM

## 2020-10-06 DIAGNOSIS — Y92481 Parking lot as the place of occurrence of the external cause: Secondary | ICD-10-CM | POA: Diagnosis not present

## 2020-10-06 DIAGNOSIS — I10 Essential (primary) hypertension: Secondary | ICD-10-CM | POA: Insufficient documentation

## 2020-10-06 MED ORDER — TETANUS-DIPHTH-ACELL PERTUSSIS 5-2.5-18.5 LF-MCG/0.5 IM SUSY
0.5000 mL | PREFILLED_SYRINGE | Freq: Once | INTRAMUSCULAR | Status: AC
  Administered 2020-10-06: 0.5 mL via INTRAMUSCULAR
  Filled 2020-10-06: qty 0.5

## 2020-10-06 NOTE — Discharge Instructions (Signed)
Your x-rays and CT scans are negative for serious traumatic injury.  No broken bones.  Follow-up with your primary doctor.  Use Tylenol or Motrin as needed for aches and pains.  Return to the ED with new or worsening symptoms including weakness, numbness, tingling, worsening pain, any other concerns.

## 2020-10-06 NOTE — ED Notes (Signed)
Pt has drank 18 oz et has ambulated from room to bathroom without difficulty
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
## 2020-10-25 ENCOUNTER — Emergency Department (HOSPITAL_COMMUNITY): Payer: Medicaid Other

## 2020-10-25 ENCOUNTER — Encounter (HOSPITAL_COMMUNITY): Payer: Self-pay | Admitting: *Deleted

## 2020-10-25 ENCOUNTER — Emergency Department (HOSPITAL_COMMUNITY)
Admission: EM | Admit: 2020-10-25 | Discharge: 2020-10-25 | Discharge status: Against Medical Advice | Payer: Medicaid Other | Attending: Emergency Medicine | Admitting: Emergency Medicine

## 2020-10-25 ENCOUNTER — Other Ambulatory Visit: Payer: Self-pay

## 2020-10-25 ENCOUNTER — Encounter (HOSPITAL_COMMUNITY): Payer: Self-pay | Admitting: Emergency Medicine

## 2020-10-25 DIAGNOSIS — Y908 Blood alcohol level of 240 mg/100 ml or more: Secondary | ICD-10-CM | POA: Diagnosis not present

## 2020-10-25 DIAGNOSIS — M79631 Pain in right forearm: Secondary | ICD-10-CM | POA: Insufficient documentation

## 2020-10-25 DIAGNOSIS — Z20822 Contact with and (suspected) exposure to covid-19: Secondary | ICD-10-CM | POA: Insufficient documentation

## 2020-10-25 DIAGNOSIS — Z79899 Other long term (current) drug therapy: Secondary | ICD-10-CM | POA: Diagnosis not present

## 2020-10-25 DIAGNOSIS — F17210 Nicotine dependence, cigarettes, uncomplicated: Secondary | ICD-10-CM | POA: Insufficient documentation

## 2020-10-25 DIAGNOSIS — R4781 Slurred speech: Secondary | ICD-10-CM | POA: Diagnosis present

## 2020-10-25 DIAGNOSIS — I639 Cerebral infarction, unspecified: Secondary | ICD-10-CM | POA: Diagnosis not present

## 2020-10-25 DIAGNOSIS — F10120 Alcohol abuse with intoxication, uncomplicated: Secondary | ICD-10-CM | POA: Insufficient documentation

## 2020-10-25 DIAGNOSIS — F10929 Alcohol use, unspecified with intoxication, unspecified: Secondary | ICD-10-CM

## 2020-10-25 DIAGNOSIS — Z5321 Procedure and treatment not carried out due to patient leaving prior to being seen by health care provider: Secondary | ICD-10-CM | POA: Insufficient documentation

## 2020-10-25 DIAGNOSIS — L089 Local infection of the skin and subcutaneous tissue, unspecified: Secondary | ICD-10-CM | POA: Insufficient documentation

## 2020-10-25 DIAGNOSIS — R202 Paresthesia of skin: Secondary | ICD-10-CM | POA: Insufficient documentation

## 2020-10-25 DIAGNOSIS — R531 Weakness: Secondary | ICD-10-CM

## 2020-10-25 DIAGNOSIS — I10 Essential (primary) hypertension: Secondary | ICD-10-CM | POA: Diagnosis not present

## 2020-10-25 DIAGNOSIS — M79601 Pain in right arm: Secondary | ICD-10-CM | POA: Insufficient documentation

## 2020-10-25 DIAGNOSIS — Z89512 Acquired absence of left leg below knee: Secondary | ICD-10-CM | POA: Insufficient documentation

## 2020-10-25 LAB — COMPREHENSIVE METABOLIC PANEL
ALT: 138 U/L — ABNORMAL HIGH (ref 0–44)
AST: 386 U/L — ABNORMAL HIGH (ref 15–41)
Albumin: 4.5 g/dL (ref 3.5–5.0)
Alkaline Phosphatase: 70 U/L (ref 38–126)
Anion gap: 12 (ref 5–15)
BUN: 7 mg/dL (ref 6–20)
CO2: 25 mmol/L (ref 22–32)
Calcium: 8.8 mg/dL — ABNORMAL LOW (ref 8.9–10.3)
Chloride: 103 mmol/L (ref 98–111)
Creatinine, Ser: 0.6 mg/dL — ABNORMAL LOW (ref 0.61–1.24)
GFR, Estimated: >60 mL/min (ref >60)
Glucose, Bld: 83 mg/dL (ref 70–99)
Potassium: 3.8 mmol/L (ref 3.5–5.1)
Sodium: 140 mmol/L (ref 135–145)
Total Bilirubin: 0.5 mg/dL (ref 0.3–1.2)
Total Protein: 8.4 g/dL — ABNORMAL HIGH (ref 6.5–8.1)

## 2020-10-25 LAB — CBC
HCT: 40.1 % (ref 39.0–52.0)
Hemoglobin: 13.1 g/dL (ref 13.0–17.0)
MCH: 28.5 pg (ref 26.0–34.0)
MCHC: 32.7 g/dL (ref 30.0–36.0)
MCV: 87.2 fL (ref 80.0–100.0)
Platelets: 213 10*3/uL (ref 150–400)
RBC: 4.6 MIL/uL (ref 4.22–5.81)
RDW: 16.7 % — ABNORMAL HIGH (ref 11.5–15.5)
WBC: 5.6 10*3/uL (ref 4.0–10.5)
nRBC: 0 % (ref 0.0–0.2)

## 2020-10-25 LAB — DIFFERENTIAL
Abs Immature Granulocytes: 0.02 10*3/uL (ref 0.00–0.07)
Basophils Absolute: 0.2 10*3/uL — ABNORMAL HIGH (ref 0.0–0.1)
Basophils Relative: 3 %
Eosinophils Absolute: 0.1 10*3/uL (ref 0.0–0.5)
Eosinophils Relative: 1 %
Immature Granulocytes: 0 %
Lymphocytes Relative: 47 %
Lymphs Abs: 2.6 10*3/uL (ref 0.7–4.0)
Monocytes Absolute: 0.4 10*3/uL (ref 0.1–1.0)
Monocytes Relative: 8 %
Neutro Abs: 2.3 10*3/uL (ref 1.7–7.7)
Neutrophils Relative %: 41 %

## 2020-10-25 LAB — RESP PANEL BY RT-PCR (FLU A&B, COVID) ARPGX2
Influenza A by PCR: NEGATIVE
Influenza B by PCR: NEGATIVE
SARS Coronavirus 2 by RT PCR: NEGATIVE

## 2020-10-25 LAB — ETHANOL: Alcohol, Ethyl (B): 424 mg/dL (ref <10)

## 2020-10-25 LAB — PROTIME-INR
INR: 1.1 (ref 0.8–1.2)
Prothrombin Time: 14.6 seconds (ref 11.4–15.2)

## 2020-10-25 LAB — APTT: aPTT: 29 seconds (ref 24–36)

## 2020-10-25 LAB — CBG MONITORING, ED: Glucose-Capillary: 90 mg/dL (ref 70–99)

## 2020-10-25 MED ORDER — IOHEXOL 350 MG/ML SOLN
75.0000 mL | Freq: Once | INTRAVENOUS | Status: AC | PRN
  Administered 2020-10-25: 75 mL via INTRAVENOUS

## 2020-10-25 NOTE — ED Triage Notes (Signed)
Pt c/o right side numbness. Pt seen here earlier tonight but left AMA.

## 2020-10-25 NOTE — ED Notes (Signed)
Pts family came to nursing station, pt had removed IV and monitor. Pt then exits room stating "I'm leaving and I aint waiting on no papers!". Pt ambulatory out of ED with family present. MD and primary RN notified.

## 2020-10-25 NOTE — ED Triage Notes (Signed)
Right side numbness onset today, smells of ETOH.speech is slurred

## 2020-10-25 NOTE — Progress Notes (Signed)
Code stroke documentation 1827 call time 1843 exam started 1847 exam finished 1850 Images sent to [facility] 1852 Exam completed in [facility] Radiology called

## 2020-10-25 NOTE — ED Triage Notes (Signed)
Symptoms started 3 hours ago

## 2020-10-25 NOTE — ED Provider Notes (Signed)
[facility] EMERGENCY DEPARTMENT Provider Note   CSN: [id number] Arrival date & time: 10/25/20  1802     History No chief complaint on file.   Damon Alexander is a 35 y.o. male.  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  He also noticed some pain in his right forearm.  There was a small area of swelling on the volar forearm on the proximal area volar surface.  The patient states he has had difficulty walking and in fact needed a wheelchair because he cannot move his right arm or right leg very well.  There is no obvious facial droop of course he does have some slurred speech secondary to self-admittedly drinking lots of alcohol, both he and his significant other have been partaking.  The patient denies a headache, denies fevers or chills, denies nausea or vomiting.  Symptoms were acute in onset, they occurred 3 hours ago, again last seen normal was around 3:30 PM according to the significant other  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/06/2016   Displaced pilon fracture of left tibia, subsequent encounter for open fracture type IIIA, IIIB, or IIIC with malunion 03/06/2016   HTN (hypertension) 12/05/2015   pt denies this   Medical history non-contributory    Nicotine dependence 10/12/2015   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   Type III open fracture of left tibial plafond with involvement of fibula 10/12/2015    Patient Active Problem List   Diagnosis Date Noted   History of left below knee amputation (HCC)  08/12/2017   Osteomyelitis (HCC) 08/03/2017   MRSA bacteremia 08/03/2017   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)    Left leg swelling 07/30/2017   Abscess of left leg 07/28/2016   Left leg cellulitis 07/26/2016   Hypokalemia 07/26/2016   Acquired subluxation of left ankle 03/06/2016   Displaced pilon fracture of left tibia, subsequent encounter for open fracture type IIIA, IIIB, or IIIC with malunion 03/06/2016   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, type III, with nonunion 02/12/2016   HTN (hypertension) 12/05/2015   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   Open fracture of tibial plafond 12/04/2015   Type III open fracture of left tibial plafond with involvement of fibula 10/12/2015   Alcohol abuse 10/12/2015   Nicotine dependence 10/12/2015    Past Surgical History:  Procedure Laterality Date   AMPUTATION Left 08/02/2017   Procedure: AMPUTATION BELOW KNEE;  Surgeon: Nadara Mustard, MD;  Location: [facility] OR;  Service: Orthopedics;  Laterality: Left;   ANKLE FUSION Left 02/12/2016   Procedure: ARTHRODESIS ANKLE;  Surgeon: Myrene Galas, MD;  Location: [facility] OR;  Service: Orthopedics;  Laterality: Left;   APPLICATION OF WOUND VAC Left 10/05/2015   Procedure: APPLICATION OF WOUND VAC;  Surgeon: Cammy Copa, MD;  Location: [facility] OR;  Service: Orthopedics;  Laterality: Left;   APPLICATION OF WOUND VAC Left 10/09/2015   Procedure: APPLICATION OF WOUND VAC;  Surgeon: Myrene Galas, MD;  Location: [facility] OR;  Service: Orthopedics;  Laterality: Left;   EXTERNAL FIXATION LEG Left 10/05/2015   Procedure: EXTERNAL FIXATION LEG LEFT ANKLE & LEFT LOWER LEG;  Surgeon: Cammy Copa, MD;  Location: [facility] OR;  Service: Orthopedics;  Laterality: Left;   EXTERNAL FIXATION REMOVAL Left 12/04/2015   Procedure: REMOVAL EXTERNAL FIXATION LEFT LEG;  Surgeon: Myrene Galas, MD;  Location: [facility] OR;  Service: Orthopedics;  Laterality: Left;   I & D EXTREMITY Left 10/05/2015   Procedure:  IRRIGATION AND DEBRIDEMENT EXTREMITY LEFT ANKLE & LEFT  LOWER LEG,PLACEMENT OF ANTIBIOTIC BEADS;  Surgeon: Cammy Copa, MD;  Location: [facility] OR;  Service: Orthopedics;  Laterality: Left;   I & D EXTREMITY Left 10/09/2015   Procedure: IRRIGATION AND DEBRIDEMENT LEFT ANKLE POSSIBLE EX-FIX ADJUSTMENT;  Surgeon: Myrene Galas, MD;  Location: [facility] OR;  Service: Orthopedics;  Laterality: Left;   I & D EXTREMITY Left 07/27/2016   Procedure: IRRIGATION AND DEBRIDEMENT EXTREMITY;  Surgeon: Sheral Apley, MD;  Location: [facility] OR;  Service: Orthopedics;  Laterality: Left;   LEG SURGERY     "rod in my right leg"   MANDIBLE FRACTURE SURGERY     NO PAST SURGERIES     SKIN SPLIT GRAFT Left 12/04/2015   Procedure: SKIN GRAFT SPLIT THICKNESS LEFT LEG;  Surgeon: Myrene Galas, MD;  Location: [facility] OR;  Service: Orthopedics;  Laterality: Left;   TIBIA IM NAIL INSERTION Right 07/31/2012   Procedure: INTRAMEDULLARY (IM) NAIL TIBIAL;  Surgeon: Nadara Mustard, MD;  Location: [facility] OR;  Service: Orthopedics;  Laterality: Right;  Intramedullary Nail right tib/fib       Family History  Problem Relation Age of Onset   Hypertension Mother     Social History   Tobacco Use   Smoking status: Every Day    Packs/day: 0.50    Types: Cigarettes   Smokeless tobacco: Never  Vaping Use   Vaping Use: Never used  Substance Use Topics   Alcohol use: Yes    Alcohol/week: 2.0 standard drinks    Types: 2 Cans of beer per week   Drug use: No    Home Medications Prior to Admission medications   Medication Sig Start Date End Date Taking? Authorizing Provider  cephALEXin (KEFLEX) 500 MG capsule Take 1 capsule (500 mg total) by mouth 3 (three) times daily. 08/12/19   Raeford Razor, MD  DULoxetine (CYMBALTA) 60 MG capsule Take 60 mg by mouth daily. 12/15/19   [provider]  folic acid (FOLVITE) 1 MG tablet Take 1 mg by mouth daily. 03/16/20   [provider]  HYDROcodone-acetaminophen (NORCO) 5-325 MG tablet  Take 1-2 tablets by mouth every 6 (six) hours as needed. 06/11/20   Geoffery Lyons, MD  magnesium oxide (MAG-OX) 400 (240 Mg) MG tablet Take 1 tablet by mouth 2 (two) times daily. 03/16/20   [provider]  naproxen (NAPROSYN) 500 MG tablet Take 1 tablet (500 mg total) by mouth 2 (two) times daily. 06/11/20   Geoffery Lyons, MD  olmesartan (BENICAR) 20 MG tablet Take 20 mg by mouth daily. 12/15/19   [provider]  thiamine (VITAMIN B-1) 100 MG tablet Take 100 mg by mouth daily. 03/16/20   [provider]    Allergies    Ciprofloxacin  Review of Systems   Review of Systems  All other systems reviewed and are negative.  Physical Exam Updated Vital Signs BP 115/72   Pulse 79   Temp 98.3  F (36.8 C)   Resp 16   SpO2 96%   Physical Exam Vitals and nursing note reviewed.  Constitutional:      General: He is not in acute distress.    Appearance: He is well-developed.  HENT:     Head: Normocephalic and atraumatic.     Mouth/Throat:     Pharynx: No oropharyngeal exudate.  Eyes:     General: No scleral icterus.       Right eye: No discharge.        Left eye: No discharge.     Conjunctiva/sclera: Conjunctivae normal.     Pupils: Pupils are equal, round, and reactive to light.  Neck:     Thyroid: No thyromegaly.     Vascular: No JVD.  Cardiovascular:     Rate and Rhythm: Normal rate and regular rhythm.     Heart sounds: Normal heart sounds. No murmur heard.   No friction rub. No gallop.  Pulmonary:     Effort: Pulmonary effort is normal. No respiratory distress.     Breath sounds: Normal breath sounds. No wheezing or rales.  Abdominal:     General: Bowel sounds are normal. There is no distension.     Palpations: Abdomen is soft. There is no mass.     Tenderness: There is no abdominal tenderness.  Musculoskeletal:        General: Tenderness present. Normal range of motion.     Cervical back: Normal range of motion and neck supple.     Comments: Right  proximal volar forearm with obvious area of tenderness and a small lump there.  Lymphadenopathy:     Cervical: No cervical adenopathy.  Skin:    General: Skin is warm and dry.     Findings: No erythema or rash.  Neurological:     Mental Status: He is alert.     Coordination: Coordination normal.     Comments: The patient is very resistant to wanting to use the right arm or right leg, he is obviously able to move the arm but seems to be weak, he will not lift the right leg off of the wheelchair.  When I squeeze his calf he has pain and does move the leg but it does seem to be asymmetrically weak.  His left lower extremity has a prosthesis in place, there is no facial droop, mild slurred speech, cranial nerves III through XII are otherwise normal.  Psychiatric:        Behavior: Behavior normal.    ED Results / Procedures / Treatments   Labs (all labs ordered are listed, but only abnormal results are displayed) Labs Reviewed  ETHANOL - Abnormal; Notable for the following components:      Result Value   Alcohol, Ethyl (B) 424 (*)    All other components within normal limits  CBC - Abnormal; Notable for the following components:   RDW 16.7 (*)    All other components within normal limits  DIFFERENTIAL - Abnormal; Notable for the following components:   Basophils Absolute 0.2 (*)    All other components within normal limits  COMPREHENSIVE METABOLIC PANEL - Abnormal; Notable for the following components:   Creatinine, Ser 0.60 (*)    Calcium 8.8 (*)    Total Protein 8.4 (*)    AST 386 (*)    ALT 138 (*)    All other components within normal limits  RESP PANEL BY RT-PCR (FLU A&B, COVID) ARPGX2  PROTIME-INR  APTT  RAPID URINE  DRUG SCREEN, HOSP PERFORMED  URINALYSIS, ROUTINE W REFLEX MICROSCOPIC  I-STAT CHEM 8, ED  CBG MONITORING, ED    EKG None  Radiology CT Angio Head W or Wo Contrast  Result Date: 10/25/2020 CLINICAL DATA:  Neuro deficit, acute, stroke suspected EXAM: CT  ANGIOGRAPHY HEAD AND NECK TECHNIQUE: Multidetector CT imaging of the head and neck was performed using the standard protocol during bolus administration of intravenous contrast. Multiplanar CT image reconstructions and MIPs were obtained to evaluate the vascular anatomy. Carotid stenosis measurements (when applicable) are obtained utilizing NASCET criteria, using the distal internal carotid diameter as the denominator. CONTRAST:  75mL OMNIPAQUE IOHEXOL 350 MG/ML SOLN COMPARISON:  None. FINDINGS: CTA NECK Aortic arch: Great vessel origins are patent. Right carotid system: Patent.  No stenosis. Left carotid system: Patent.  No stenosis. Vertebral arteries: Patent. Right vertebral artery slightly dominant. No stenosis. Skeleton: Mild degenerative changes at C4-C5. Other neck: Unremarkable. Upper chest: Areas of cystic change and nodularity also present on prior chest CT. Review of the MIP images confirms the above findings CTA HEAD Suboptimal arterial evaluation due to significant venous opacification. Anterior circulation: Intracranial internal carotid arteries are patent. Anterior and middle cerebral arteries are patent. Posterior circulation: Intracranial vertebral arteries, basilar artery, and posterior cerebral arteries are patent. Venous sinuses: Patent. Review of the MIP images confirms the above findings IMPRESSION: No large vessel occlusion, hemodynamically significant stenosis, or evidence of dissection. Electronically Signed   By: Guadlupe Spanish M.D.   On: 10/25/2020 19:16   CT Angio Neck W and/or Wo Contrast  Result Date: 10/25/2020 CLINICAL DATA:  Neuro deficit, acute, stroke suspected EXAM: CT ANGIOGRAPHY HEAD AND NECK TECHNIQUE: Multidetector CT imaging of the head and neck was performed using the standard protocol during bolus administration of intravenous contrast. Multiplanar CT image reconstructions and MIPs were obtained to evaluate the vascular anatomy. Carotid stenosis measurements (when  applicable) are obtained utilizing NASCET criteria, using the distal internal carotid diameter as the denominator. CONTRAST:  75mL OMNIPAQUE IOHEXOL 350 MG/ML SOLN COMPARISON:  None. FINDINGS: CTA NECK Aortic arch: Great vessel origins are patent. Right carotid system: Patent.  No stenosis. Left carotid system: Patent.  No stenosis. Vertebral arteries: Patent. Right vertebral artery slightly dominant. No stenosis. Skeleton: Mild degenerative changes at C4-C5. Other neck: Unremarkable. Upper chest: Areas of cystic change and nodularity also present on prior chest CT. Review of the MIP images confirms the above findings CTA HEAD Suboptimal arterial evaluation due to significant venous opacification. Anterior circulation: Intracranial internal carotid arteries are patent. Anterior and middle cerebral arteries are patent. Posterior circulation: Intracranial vertebral arteries, basilar artery, and posterior cerebral arteries are patent. Venous sinuses: Patent. Review of the MIP images confirms the above findings IMPRESSION: No large vessel occlusion, hemodynamically significant stenosis, or evidence of dissection. Electronically Signed   By: Guadlupe Spanish M.D.   On: 10/25/2020 19:16   CT HEAD CODE STROKE WO CONTRAST  Result Date: 10/25/2020 CLINICAL DATA:  Code stroke. EXAM: CT HEAD WITHOUT CONTRAST TECHNIQUE: Contiguous axial images were obtained from the base of the skull through the vertex without intravenous contrast. COMPARISON:  10/06/2020 FINDINGS: Brain: No acute intracranial hemorrhage, mass effect, or edema. Gray-white differentiation is preserved. Ventricles and sulci are normal in size and configuration. No extra-axial collection. Vascular: No hyperdense vessel. Skull: Unremarkable Sinuses/Orbits: No acute abnormality. Other: Mastoid air cells are clear. ASPECTS [facility] Stroke Program Early CT Score) - Ganglionic level infarction (caudate, lentiform nuclei, internal capsule, insula, M1-M3 cortex): 7 -  Supraganglionic  infarction (M4-M6 cortex): 3 Total score (0-10 with 10 being normal): 10 IMPRESSION: There is no acute intracranial hemorrhage or evidence of acute infarction. ASPECT score is 10. These results were called by telephone at the time of interpretation on 10/25/2020 at 6:58 pm to provider [facility] , who verbally acknowledged these results. Electronically Signed   By: Guadlupe Spanish M.D.   On: 10/25/2020 18:59    Procedures Procedures   Medications Ordered in ED Medications  iohexol (OMNIPAQUE) 350 MG/ML injection 75 mL (75 mLs Intravenous Contrast Given 10/25/20 1857)    ED Course  I have reviewed the triage vital signs and the nursing notes.  Pertinent labs & imaging results that were available during my care of the patient were reviewed by me and considered in my medical decision making (see chart for details).  Clinical Course as of 10/25/20 2044  Thu Oct 25, 2020  0240 I discussed the case with neurology, Dr. Otelia Limes who recommends that this patient be admitted to the hospital for MRI and stroke work-up.  Labs pending at this time, outside of tPA window as last seen normal was likely last night [BM]    Clinical Course User Index [BM] Eber Hong, MD   MDM Rules/Calculators/A&P                           This patient has several concerning factors including being intoxicated with alcohol but also having focal right-sided weakness and numbness.  It is difficult to know what to make of this given his intoxicated status.  The vital signs do not reflect any significant abnormalities.  He will need a code stroke activated given his last seen normal was 3 hours ago.  This patient became very upset stating he did not want a wait any longer in the emergency department and walked out with any visible weakness.  He was cursing screaming and yelling, he refused to sign AGAINST MEDICAL ADVICE.  He refused to listen to rational thought, he has a stable gait at the time of his leaving  AGAINST MEDICAL ADVICE.  Final Clinical Impression(s) / ED Diagnoses Final diagnoses:  Weakness  Alcoholic intoxication with complication [facility])    Rx / DC Orders ED Discharge Orders     None        Eber Hong, MD 10/25/20 2044

## 2020-10-25 NOTE — Consult Note (Signed)
[facility] TeleNeurology Consult Services    Date of Service:  10/25/2020    Metrics: Last Known Well: 10:00 PM Wednesday Symptoms: As per HPI.  Patient is not a candidate for thrombolytic: Outside of the TNK time window.    Location of the provider: [facility]  Location of the patient: [facility] Pre-Morbid Modified Rankin Scale: 0  This consult was provided via telemedicine with 2-way video and audio communication. The patient/family was informed that care would be provided in this way and agreed to receive care in this manner.   ED Physician notified of diagnostic impression and management plan at: 7:35 PM   Assessment: 35 year old male presenting after he was noted to be acutely confused with right sided weakness at home.   -Exam reveals right sided weakness, arm worse than leg, with no associated facial droop, ocular motility defect, vision loss or aphasia. NIHSS 2 - CT head: There is no acute intracranial hemorrhage or evidence of acute infarction. ASPECT score is 10. - CTA of head and neck: No large vessel occlusion, hemodynamically significant stenosis, or evidence of dissection. - The patient is not a candidate for IV thrombolysis or thrombectomy based on time and vascular imaging criteria, respectively.  - DDx for presentation includes a left sided lacunar infarction, versus conversion disorder, versus limitations of movement secondary to pain and/or cooperation in the setting of what appears to be an acute episode of anxiety.     Recommendations: - MRI brain without contrast.  - Frequent neuro checks - ASA 650 mg PO crushed x 1 - Permissive HTN x 24 hours - IVF - Pain management - Evaluation of painful solid to cystic deep tissue lesion of right forearm - EtOH level and UTOX - Comprehensive metabolic panel and CBC - Pain management       ------------------------------------------------------------------------------   History of Present Illness: 35 year old male with a PMHx of left BKA, seizure as a side effect of ciprofloxacin, EtOH and nicotine dependence, who presents to the ED after he was noted to be acutely confused with right sided weakness at home. LKN was 10:00 PM yesterday when he went to bed. He awoke at 2 AM with right sided weakness per his partner, and was also difficult to arouse. The patient remained at home until 9 AM, when he accompanied his partner to get her flat tire fixed. She states that he continued to be confused and weak on his right side during this period of time. She notes that he was also having significant difficulty putting his left lower extremity prosthesis on, with poor coordination as well as weakness of his right hand, in conjunction with weakness of the remainder of his RUE. His RLE was also weak. She noticed a right facial droop as well. The patient corroborates all of the above and also clarifies that his right side felt like it had about "35% of normal strength" at its worst, but now is starting to recover. He endorses sensory numbness along his entire right side including his face. He agrees with his partner that he was confused, but that this is now significantly better.   He states that he has had bilateral blurred vision for the past year. His partner states that he drinks "all day" every day and that it amounts to about 5 beers and 6-7 shots of vodka total on a daily basis.   BP was 129/82 on arrival. HR 98. CBG 90.     Past  Medical History: 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/06/2016   Displaced pilon fracture of left tibia, subsequent encounter for open fracture type IIIA, IIIB, or IIIC with malunion 03/06/2016   HTN (hypertension) 12/05/2015   pt denies this   Medical history non-contributory    Nicotine dependence 10/12/2015   Seizures (HCC)     due to a reaction from Cipro   Type III open fracture of left tibial plafond with involvement of fibula 10/12/2015      Past Surgical History: Past Surgical History:  Procedure Laterality Date   AMPUTATION Left 08/02/2017   Procedure: AMPUTATION BELOW KNEE;  Surgeon: Nadara Mustard, MD;  Location: [facility] OR;  Service: Orthopedics;  Laterality: Left;   ANKLE FUSION Left 02/12/2016   Procedure: ARTHRODESIS ANKLE;  Surgeon: Myrene Galas, MD;  Location: [facility] OR;  Service: Orthopedics;  Laterality: Left;   APPLICATION OF WOUND VAC Left 10/05/2015   Procedure: APPLICATION OF WOUND VAC;  Surgeon: Cammy Copa, MD;  Location: [facility] OR;  Service: Orthopedics;  Laterality: Left;   APPLICATION OF WOUND VAC Left 10/09/2015   Procedure: APPLICATION OF WOUND VAC;  Surgeon: Myrene Galas, MD;  Location: [facility] OR;  Service: Orthopedics;  Laterality: Left;   EXTERNAL FIXATION LEG Left 10/05/2015   Procedure: EXTERNAL FIXATION LEG LEFT ANKLE & LEFT LOWER LEG;  Surgeon: Cammy Copa, MD;  Location: [facility] OR;  Service: Orthopedics;  Laterality: Left;   EXTERNAL FIXATION REMOVAL Left 12/04/2015   Procedure: REMOVAL EXTERNAL FIXATION LEFT LEG;  Surgeon: Myrene Galas, MD;  Location: [facility] OR;  Service: Orthopedics;  Laterality: Left;   I & D EXTREMITY Left 10/05/2015   Procedure: IRRIGATION AND DEBRIDEMENT EXTREMITY LEFT ANKLE & LEFT  LOWER LEG,PLACEMENT OF ANTIBIOTIC BEADS;  Surgeon: Cammy Copa, MD;  Location: [facility] OR;  Service: Orthopedics;  Laterality: Left;   I & D EXTREMITY Left 10/09/2015   Procedure: IRRIGATION AND DEBRIDEMENT LEFT ANKLE POSSIBLE EX-FIX ADJUSTMENT;  Surgeon: Myrene Galas, MD;  Location: [facility] OR;  Service: Orthopedics;  Laterality: Left;   I & D EXTREMITY Left 07/27/2016   Procedure: IRRIGATION AND DEBRIDEMENT EXTREMITY;  Surgeon: Sheral Apley, MD;  Location: [facility] OR;  Service: Orthopedics;  Laterality: Left;   LEG SURGERY     "rod in my right leg"   MANDIBLE FRACTURE SURGERY     NO PAST SURGERIES      SKIN SPLIT GRAFT Left 12/04/2015   Procedure: SKIN GRAFT SPLIT THICKNESS LEFT LEG;  Surgeon: Myrene Galas, MD;  Location: [facility] OR;  Service: Orthopedics;  Laterality: Left;   TIBIA IM NAIL INSERTION Right 07/31/2012   Procedure: INTRAMEDULLARY (IM) NAIL TIBIAL;  Surgeon: Nadara Mustard, MD;  Location: [facility] OR;  Service: Orthopedics;  Laterality: Right;  Intramedullary Nail right tib/fib     Medications:  No current facility-administered medications on file prior to encounter.   Current Outpatient Medications on File Prior to Encounter  Medication Sig Dispense Refill   cephALEXin (KEFLEX) 500 MG capsule Take 1 capsule (500 mg total) by mouth 3 (three) times daily. 21 capsule 0   DULoxetine (CYMBALTA) 60 MG capsule Take 60 mg by mouth daily.     folic acid (FOLVITE) 1 MG tablet Take 1 mg by mouth daily.     HYDROcodone-acetaminophen (NORCO) 5-325 MG tablet Take 1-2 tablets by mouth every 6 (six) hours as needed. 10 tablet 0   magnesium oxide (MAG-OX) 400 (240 Mg) MG tablet Take 1 tablet by  mouth 2 (two) times daily.     naproxen (NAPROSYN) 500 MG tablet Take 1 tablet (500 mg total) by mouth 2 (two) times daily. 15 tablet 0   olmesartan (BENICAR) 20 MG tablet Take 20 mg by mouth daily.     thiamine (VITAMIN B-1) 100 MG tablet Take 100 mg by mouth daily.          Social History: As above   Family History:  Reviewed in Epic   ROS: As per HPI    Anticoagulant use:  No    Antiplatelet use: No    Examination:       1A: Level of Consciousness - 0 1B: Ask Month and Age - 0 1C: Blink Eyes & Squeeze Hands - 0 2: Test Horizontal Extraocular Movements - 0 3: Test Visual Fields - 0 4: Test Facial Palsy (Use Grimace if Obtunded) - 0 5A: Test Left Arm Motor Drift - 0 5B: Test Right Arm Motor Drift - 0 6A: Test Left Leg Motor Drift - 0 6B: Test Right Leg Motor Drift - 2 7: Test Limb Ataxia (FNF/Heel-Shin) - 0 8: Test Sensation -  0 9: Test Language/Aphasia - 0 10: Test Dysarthria -  Severe Dysarthria: 0 11: Test Extinction/Inattention - Extinction to bilateral simultaneous stimulation 0   NIHSS Score: 2     Patient/Family was informed the Neurology Consult would occur via TeleHealth consult by way of interactive audio and video telecommunications and consented to receiving care in this manner.   Patient is being evaluated for possible acute neurologic impairment and high pretest probability of imminent or life-threatening deterioration. I spent total of 40 minutes providing care to this patient, including time for face to face visit via telemedicine, review of medical records, imaging studies and discussion of findings with providers, the patient and/or family.   Electronically signed: Dr. Caryl [name]

## 2020-10-26 ENCOUNTER — Emergency Department (HOSPITAL_COMMUNITY)
Admission: EM | Admit: 2020-10-26 | Discharge: 2020-10-26 | Disposition: A | Discharge status: Home / Self Care | Payer: Medicaid Other | Attending: Emergency Medicine | Admitting: Emergency Medicine

## 2020-10-26 ENCOUNTER — Encounter (HOSPITAL_COMMUNITY): Payer: Self-pay

## 2020-10-26 ENCOUNTER — Emergency Department (HOSPITAL_COMMUNITY): Payer: Medicaid Other

## 2020-10-26 ENCOUNTER — Emergency Department (HOSPITAL_COMMUNITY)
Admission: EM | Admit: 2020-10-26 | Discharge: 2020-10-26 | Disposition: A | Discharge status: Home / Self Care | Payer: Medicaid Other | Source: Home / Self Care | Attending: Emergency Medicine | Admitting: Emergency Medicine

## 2020-10-26 ENCOUNTER — Other Ambulatory Visit: Payer: Self-pay

## 2020-10-26 DIAGNOSIS — I10 Essential (primary) hypertension: Secondary | ICD-10-CM | POA: Insufficient documentation

## 2020-10-26 DIAGNOSIS — R202 Paresthesia of skin: Secondary | ICD-10-CM | POA: Insufficient documentation

## 2020-10-26 DIAGNOSIS — F17210 Nicotine dependence, cigarettes, uncomplicated: Secondary | ICD-10-CM | POA: Insufficient documentation

## 2020-10-26 DIAGNOSIS — L089 Local infection of the skin and subcutaneous tissue, unspecified: Secondary | ICD-10-CM | POA: Insufficient documentation

## 2020-10-26 DIAGNOSIS — Z89512 Acquired absence of left leg below knee: Secondary | ICD-10-CM | POA: Insufficient documentation

## 2020-10-26 DIAGNOSIS — Z79899 Other long term (current) drug therapy: Secondary | ICD-10-CM | POA: Insufficient documentation

## 2020-10-26 DIAGNOSIS — M79609 Pain in unspecified limb: Secondary | ICD-10-CM

## 2020-10-26 DIAGNOSIS — M79601 Pain in right arm: Secondary | ICD-10-CM | POA: Insufficient documentation

## 2020-10-26 MED ORDER — CEFTRIAXONE SODIUM 1 G IJ SOLR
1.0000 g | Freq: Once | INTRAMUSCULAR | Status: AC
  Administered 2020-10-26: 1 g via INTRAMUSCULAR
  Filled 2020-10-26: qty 10

## 2020-10-26 MED ORDER — DOXYCYCLINE HYCLATE 100 MG PO CAPS: 100.0000 mg | ORAL_CAPSULE | Freq: Two times a day (BID) | ORAL | 0 refills | Status: DC

## 2020-10-26 MED ORDER — HYDROCODONE-ACETAMINOPHEN 5-325 MG PO TABS
2.0000 | ORAL_TABLET | Freq: Once | ORAL | Status: AC
  Administered 2020-10-26: 2 via ORAL
  Filled 2020-10-26: qty 2

## 2020-10-26 NOTE — Discharge Instructions (Addendum)
Schedule to see Dr. Gerilyn Pilgrim for recheck.  Antibiotics as directed

## 2020-10-26 NOTE — ED Provider Notes (Signed)
[facility] EMERGENCY DEPARTMENT Provider Note   CSN: [id number] Arrival date & time: 10/26/20  1301     History Chief Complaint  Patient presents with   Abscess    Damon Alexander is a 35 y.o. male.  Pt seen here last pm for evaluation of cva.  Pt left after being seen.  Pt reports he is back today for further evaluation of his right arm.  Pt reports stoke symptoms have resolved  Pt reports he is able to use arm and leg normally today.  Pt complains of a continued swollen red area right arm.  Pt has multiple scratches on his arms and his hands.  Pt denies fever or chills.  Pt denies any drug use.  He denies diabetes or high blood pressure.  Pt reports he was told he needed an MRI and is agreeable to having today.  Pt reports his main concern is his right arm soreness  The history is provided by the patient and the spouse. No language interpreter was used.      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/06/2016   Displaced pilon fracture of left tibia, subsequent encounter for open fracture type IIIA, IIIB, or IIIC with malunion 03/06/2016   HTN (hypertension) 12/05/2015   pt denies this   Medical history non-contributory    Nicotine dependence 10/12/2015   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   Type III open fracture of left tibial plafond with involvement of fibula 10/12/2015    Patient Active Problem List   Diagnosis Date Noted   History of left below knee amputation (HCC) 08/12/2017   Osteomyelitis (HCC) 08/03/2017   MRSA bacteremia 08/03/2017   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)    Left leg swelling 07/30/2017   Abscess of left leg 07/28/2016   Left leg cellulitis 07/26/2016   Hypokalemia 07/26/2016   Acquired subluxation of left ankle 03/06/2016   Displaced pilon fracture of left tibia, subsequent encounter for open fracture type IIIA, IIIB, or IIIC with malunion 03/06/2016   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, type III, with nonunion 02/12/2016   HTN (hypertension) 12/05/2015   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   Open fracture of tibial plafond 12/04/2015   Type III open fracture of left tibial plafond with involvement of fibula 10/12/2015   Alcohol abuse 10/12/2015   Nicotine dependence 10/12/2015    Past Surgical History:  Procedure Laterality Date   AMPUTATION Left 08/02/2017   Procedure: AMPUTATION BELOW KNEE;  Surgeon: Nadara Mustard, MD;  Location: [facility] OR;  Service: Orthopedics;  Laterality: Left;   ANKLE FUSION Left 02/12/2016   Procedure: ARTHRODESIS ANKLE;  Surgeon: Myrene Galas, MD;  Location: [facility] OR;  Service: Orthopedics;  Laterality: Left;   APPLICATION OF WOUND VAC Left 10/05/2015   Procedure: APPLICATION OF WOUND VAC;  Surgeon: Cammy Copa, MD;  Location: [facility] OR;  Service: Orthopedics;  Laterality: Left;   APPLICATION OF WOUND VAC Left 10/09/2015   Procedure: APPLICATION OF WOUND VAC;  Surgeon: Myrene Galas, MD;  Location: [facility] OR;  Service: Orthopedics;  Laterality: Left;   EXTERNAL FIXATION LEG Left 10/05/2015   Procedure: EXTERNAL FIXATION LEG LEFT ANKLE & LEFT LOWER LEG;  Surgeon: Cammy Copa, MD;  Location: [facility] OR;  Service: Orthopedics;  Laterality: Left;   EXTERNAL FIXATION REMOVAL Left 12/04/2015   Procedure: REMOVAL EXTERNAL FIXATION LEFT LEG;  Surgeon: Myrene Galas,  MD;  Location: [facility] OR;  Service: Orthopedics;  Laterality: Left;   I & D EXTREMITY Left 10/05/2015   Procedure: IRRIGATION AND DEBRIDEMENT EXTREMITY LEFT ANKLE & LEFT  LOWER LEG,PLACEMENT OF ANTIBIOTIC BEADS;  Surgeon: Cammy Copa, MD;  Location: [facility] OR;  Service: Orthopedics;  Laterality: Left;   I & D EXTREMITY Left 10/09/2015   Procedure: IRRIGATION AND DEBRIDEMENT LEFT ANKLE POSSIBLE EX-FIX ADJUSTMENT;  Surgeon: Myrene Galas, MD;  Location: [facility] OR;  Service: Orthopedics;  Laterality: Left;   I & D EXTREMITY Left 07/27/2016   Procedure: IRRIGATION AND DEBRIDEMENT EXTREMITY;   Surgeon: Sheral Apley, MD;  Location: [facility] OR;  Service: Orthopedics;  Laterality: Left;   LEG SURGERY     "rod in my right leg"   MANDIBLE FRACTURE SURGERY     NO PAST SURGERIES     SKIN SPLIT GRAFT Left 12/04/2015   Procedure: SKIN GRAFT SPLIT THICKNESS LEFT LEG;  Surgeon: Myrene Galas, MD;  Location: [facility] OR;  Service: Orthopedics;  Laterality: Left;   TIBIA IM NAIL INSERTION Right 07/31/2012   Procedure: INTRAMEDULLARY (IM) NAIL TIBIAL;  Surgeon: Nadara Mustard, MD;  Location: [facility] OR;  Service: Orthopedics;  Laterality: Right;  Intramedullary Nail right tib/fib       Family History  Problem Relation Age of Onset   Hypertension Mother     Social History   Tobacco Use   Smoking status: Every Day    Packs/day: 0.50    Types: Cigarettes   Smokeless tobacco: Never  Vaping Use   Vaping Use: Never used  Substance Use Topics   Alcohol use: Yes    Alcohol/week: 2.0 standard drinks    Types: 2 Cans of beer per week   Drug use: Yes    Types: Marijuana    Home Medications Prior to Admission medications   Medication Sig Start Date End Date Taking? Authorizing Provider  cephALEXin (KEFLEX) 500 MG capsule Take 1 capsule (500 mg total) by mouth 3 (three) times daily. 08/12/19   Raeford Razor, MD  DULoxetine (CYMBALTA) 60 MG capsule Take 60 mg by mouth daily. 12/15/19   [provider]  folic acid (FOLVITE) 1 MG tablet Take 1 mg by mouth daily. 03/16/20   [provider]  HYDROcodone-acetaminophen (NORCO) 5-325 MG tablet Take 1-2 tablets by mouth every 6 (six) hours as needed. 06/11/20   Geoffery Lyons, MD  magnesium oxide (MAG-OX) 400 (240 Mg) MG tablet Take 1 tablet by mouth 2 (two) times daily. 03/16/20   [provider]  naproxen (NAPROSYN) 500 MG tablet Take 1 tablet (500 mg total) by mouth 2 (two) times daily. 06/11/20   Geoffery Lyons, MD  olmesartan (BENICAR) 20 MG tablet Take 20 mg by mouth daily. 12/15/19   [provider]  thiamine (VITAMIN B-1)  100 MG tablet Take 100 mg by mouth daily. 03/16/20   [provider]    Allergies    Ciprofloxacin  Review of Systems   Review of Systems  All other systems reviewed and are negative.  Physical Exam Updated Vital Signs BP 120/84   Pulse 74   Temp 98.2 F (36.8 C) (Oral)   Resp 16   Ht 5\' 10"  (1.778 m)   Wt 77.1 kg   SpO2 100%   BMI 24.39 kg/m   Physical Exam Vitals and nursing note reviewed.  Constitutional:      Appearance: He is well-developed.  HENT:     Head: Normocephalic and atraumatic.  Eyes:  Conjunctiva/sclera: Conjunctivae normal.  Cardiovascular:     Rate and Rhythm: Normal rate and regular rhythm.     Heart sounds: No murmur heard. Pulmonary:     Effort: Pulmonary effort is normal. No respiratory distress.     Breath sounds: Normal breath sounds.  Abdominal:     Palpations: Abdomen is soft.     Tenderness: There is no abdominal tenderness.  Musculoskeletal:        General: Normal range of motion.     Cervical back: Neck supple.  Skin:    General: Skin is warm and dry.     Comments: Swollen red area right arm  approx 6cm   Neurological:     General: No focal deficit present.     Mental Status: He is alert and oriented to person, place, and time.  Psychiatric:        Mood and Affect: Mood normal.    ED Results / Procedures / Treatments   Labs (all labs ordered are listed, but only abnormal results are displayed) Labs Reviewed - No data to display  EKG None  Radiology CT Angio Head W or Wo Contrast  Result Date: 10/25/2020 CLINICAL DATA:  Neuro deficit, acute, stroke suspected EXAM: CT ANGIOGRAPHY HEAD AND NECK TECHNIQUE: Multidetector CT imaging of the head and neck was performed using the standard protocol during bolus administration of intravenous contrast. Multiplanar CT image reconstructions and MIPs were obtained to evaluate the vascular anatomy. Carotid stenosis measurements (when applicable) are obtained utilizing NASCET  criteria, using the distal internal carotid diameter as the denominator. CONTRAST:  45mL OMNIPAQUE IOHEXOL 350 MG/ML SOLN COMPARISON:  None. FINDINGS: CTA NECK Aortic arch: Great vessel origins are patent. Right carotid system: Patent.  No stenosis. Left carotid system: Patent.  No stenosis. Vertebral arteries: Patent. Right vertebral artery slightly dominant. No stenosis. Skeleton: Mild degenerative changes at C4-C5. Other neck: Unremarkable. Upper chest: Areas of cystic change and nodularity also present on prior chest CT. Review of the MIP images confirms the above findings CTA HEAD Suboptimal arterial evaluation due to significant venous opacification. Anterior circulation: Intracranial internal carotid arteries are patent. Anterior and middle cerebral arteries are patent. Posterior circulation: Intracranial vertebral arteries, basilar artery, and posterior cerebral arteries are patent. Venous sinuses: Patent. Review of the MIP images confirms the above findings IMPRESSION: No large vessel occlusion, hemodynamically significant stenosis, or evidence of dissection. Electronically Signed   By: Guadlupe Spanish M.D.   On: 10/25/2020 19:16   CT Angio Neck W and/or Wo Contrast  Result Date: 10/25/2020 CLINICAL DATA:  Neuro deficit, acute, stroke suspected EXAM: CT ANGIOGRAPHY HEAD AND NECK TECHNIQUE: Multidetector CT imaging of the head and neck was performed using the standard protocol during bolus administration of intravenous contrast. Multiplanar CT image reconstructions and MIPs were obtained to evaluate the vascular anatomy. Carotid stenosis measurements (when applicable) are obtained utilizing NASCET criteria, using the distal internal carotid diameter as the denominator. CONTRAST:  52mL OMNIPAQUE IOHEXOL 350 MG/ML SOLN COMPARISON:  None. FINDINGS: CTA NECK Aortic arch: Great vessel origins are patent. Right carotid system: Patent.  No stenosis. Left carotid system: Patent.  No stenosis. Vertebral arteries:  Patent. Right vertebral artery slightly dominant. No stenosis. Skeleton: Mild degenerative changes at C4-C5. Other neck: Unremarkable. Upper chest: Areas of cystic change and nodularity also present on prior chest CT. Review of the MIP images confirms the above findings CTA HEAD Suboptimal arterial evaluation due to significant venous opacification. Anterior circulation: Intracranial internal carotid arteries are patent. Anterior  and middle cerebral arteries are patent. Posterior circulation: Intracranial vertebral arteries, basilar artery, and posterior cerebral arteries are patent. Venous sinuses: Patent. Review of the MIP images confirms the above findings IMPRESSION: No large vessel occlusion, hemodynamically significant stenosis, or evidence of dissection. Electronically Signed   By: Guadlupe Spanish M.D.   On: 10/25/2020 19:16   MR BRAIN WO CONTRAST  Result Date: 10/26/2020 CLINICAL DATA:  Neuro deficit, acute, stroke suspected sub acute EXAM: MRI HEAD WITHOUT CONTRAST TECHNIQUE: Multiplanar, multiecho pulse sequences of the brain and surrounding structures were obtained without intravenous contrast. COMPARISON:  Same day CT. FINDINGS: Brain: Small area of T2 and DWI hyperintensity in the central pons without clear ADC correlate, likely subacute infarct. No mass effect. This area is well-demarcated and non expansile. No other areas of restricted diffusion. No hydrocephalus, acute hemorrhage, extra-axial fluid collection mass lesion, or midline shift. Vascular: Major arterial flow voids are maintained at the skull base. Skull and upper cervical spine: Normal marrow signal. Sinuses/Orbits: Small retention cysts or polyps in the inferior left maxillary sinus, partially imaged. Mild ethmoid air cell and maxillary sinus mucosal thickening. No air-fluid levels visualized. Unremarkable orbits. Other: No mastoid effusions. IMPRESSION: Small area of DWI hyperintensity in the central pons without clear ADC correlate,  likely a subacute infarct. Mild edema without mass effect. Electronically Signed   By: Feliberto Harts M.D.   On: 10/26/2020 15:35   CT HEAD CODE STROKE WO CONTRAST  Result Date: 10/25/2020 CLINICAL DATA:  Code stroke. EXAM: CT HEAD WITHOUT CONTRAST TECHNIQUE: Contiguous axial images were obtained from the base of the skull through the vertex without intravenous contrast. COMPARISON:  10/06/2020 FINDINGS: Brain: No acute intracranial hemorrhage, mass effect, or edema. Gray-white differentiation is preserved. Ventricles and sulci are normal in size and configuration. No extra-axial collection. Vascular: No hyperdense vessel. Skull: Unremarkable Sinuses/Orbits: No acute abnormality. Other: Mastoid air cells are clear. ASPECTS (Alberta Stroke Program Early CT Score) - Ganglionic level infarction (caudate, lentiform nuclei, internal capsule, insula, M1-M3 cortex): 7 - Supraganglionic infarction (M4-M6 cortex): 3 Total score (0-10 with 10 being normal): 10 IMPRESSION: There is no acute intracranial hemorrhage or evidence of acute infarction. ASPECT score is 10. These results were called by telephone at the time of interpretation on 10/25/2020 at 6:58 pm to provider [facility] , who verbally acknowledged these results. Electronically Signed   By: Guadlupe Spanish M.D.   On: 10/25/2020 18:59    Procedures Procedures   Medications Ordered in ED Medications - No data to display  ED Course  I have reviewed the triage vital signs and the nursing notes.  Pertinent labs & imaging results that were available during my care of the patient were reviewed by me and considered in my medical decision making (see chart for details).    MDM Rules/Calculators/A&P                           MDM:  MRi Brain obtained and reviewed with Dr. Otelia Limes. Pt has had resolution of symptoms.  I suspect MRI changes nonacute.  Pt counseled on symptoms.  He agrees to follow up with Dr. Gerilyn Pilgrim for evaluation .  Pt given injection  of rocephin and rx for doxycycline  Final Clinical Impression(s) / ED Diagnoses Final diagnoses:  Skin infection  Paresthesia and pain of right extremity    Rx / DC Orders ED Discharge Orders     None     An After Visit  Summary was printed and given to the patient.    Elson Areas, Cordelia Poche 10/26/20 2210    Vanetta Mulders, MD 11/05/20 1304

## 2020-10-26 NOTE — ED Notes (Signed)
Pt ambulated to restroom without difficulty

## 2020-10-26 NOTE — ED Triage Notes (Addendum)
Pt presents to ED with abscess on right forearm. Pt states he was seen last night and left AMA, was suppose to be admitted for stroke workup but didn't want to stay, left went to [facility], they didn't have MRI so he left there also. Pt states he is being seen for place on his arm today but if they want to admit him today then that's fine "I will go ahead and get it over with."

## 2020-10-26 NOTE — ED Provider Notes (Signed)
Emergency Medicine Provider Triage Evaluation Note  Damon Alexander , a 35 y.o. male  was evaluated in triage.  Pt complains of right arm weakness and a knot in right arm  Pt seen here last pm for possible cva.  Pt left ama.  Pt here agreeing to work up Review of Systems  Positive: Weakness right arm and leg  Negative:   Physical Exam  BP (!) 133/94 (BP Location: Left Arm)   Pulse 98   Temp 98.2 F (36.8 C) (Oral)   Resp 18   Ht 5\' 10"  (1.778 m)   Wt 77.1 kg   SpO2 100%   BMI 24.39 kg/m Gen:    Awake, no distress   Resp:  Normal effort  MSK:   Moves extremities without difficulty  Other:    Medical Decision Making  Medically screening exam initiated at 2:26 PM.  Appropriate orders placed.  Jahden Schara Mcgaugh was informed that the remainder of the evaluation will be completed by another provider, this initial triage assessment does not replace that evaluation, and the importance of remaining in the ED until their evaluation is complete.     [name], Elson Areas 10/26/20 1428    10/28/20, MD 11/05/20 1304

## 2020-10-26 NOTE — ED Notes (Signed)
Pt family updated. Meal tray provided.

## 2020-10-26 NOTE — ED Notes (Signed)
Water and crackers provided to patient. 

## 2020-11-01 ENCOUNTER — Emergency Department (HOSPITAL_COMMUNITY)
Admission: EM | Admit: 2020-11-01 | Discharge: 2020-11-01 | Disposition: A | Discharge status: Home / Self Care | Payer: Medicaid Other | Attending: Emergency Medicine | Admitting: Emergency Medicine

## 2020-11-01 DIAGNOSIS — M79601 Pain in right arm: Secondary | ICD-10-CM | POA: Diagnosis not present

## 2020-11-01 DIAGNOSIS — I10 Essential (primary) hypertension: Secondary | ICD-10-CM | POA: Insufficient documentation

## 2020-11-01 DIAGNOSIS — F1020 Alcohol dependence, uncomplicated: Secondary | ICD-10-CM

## 2020-11-01 DIAGNOSIS — Z79899 Other long term (current) drug therapy: Secondary | ICD-10-CM | POA: Insufficient documentation

## 2020-11-01 DIAGNOSIS — F10920 Alcohol use, unspecified with intoxication, uncomplicated: Secondary | ICD-10-CM

## 2020-11-01 DIAGNOSIS — R52 Pain, unspecified: Secondary | ICD-10-CM

## 2020-11-01 DIAGNOSIS — F17210 Nicotine dependence, cigarettes, uncomplicated: Secondary | ICD-10-CM | POA: Insufficient documentation

## 2020-11-01 DIAGNOSIS — R531 Weakness: Secondary | ICD-10-CM | POA: Diagnosis present

## 2020-11-01 LAB — ETHANOL: Alcohol, Ethyl (B): 308 mg/dL (ref <10)

## 2020-11-01 NOTE — ED Notes (Signed)
Pt given chips and soda

## 2020-11-01 NOTE — ED Notes (Signed)
Went in to give discharge papers and pt not in room

## 2020-11-01 NOTE — ED Triage Notes (Signed)
Pt states he called EMS due to him having a stroke. Pt states he had a stroke on Thursday, Friday and Saturday. Pt states he was having right sided weakness and numbness. Pt ambulatory to stretcher with no difficulty or distress noted.
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
## 2020-11-01 NOTE — Discharge Instructions (Signed)
It was our pleasure to provide your ER care today - we hope that you feel better.  Drink plenty of (non-alcoholic) fluids/stay well hydrated. Eat balanced diet. Avoid alcohol use - follow up with AA and use resource guide provided for additional treatment options.   Also follow up with primary care doctor in the next 1-2 weeks.   Return to ER if worse, new symptoms, fevers, trouble breathing, change in speech or vision, new one-sided numbness/weakness, or other concern.

## 2021-02-28 ENCOUNTER — Emergency Department (HOSPITAL_COMMUNITY): Payer: Medicaid Other

## 2021-02-28 ENCOUNTER — Emergency Department (HOSPITAL_COMMUNITY)
Admission: EM | Admit: 2021-02-28 | Discharge: 2021-03-01 | Disposition: A | Discharge status: Home / Self Care | Payer: Medicaid Other | Attending: Emergency Medicine | Admitting: Emergency Medicine

## 2021-02-28 DIAGNOSIS — T1490XA Injury, unspecified, initial encounter: Secondary | ICD-10-CM

## 2021-02-28 DIAGNOSIS — Y9301 Activity, walking, marching and hiking: Secondary | ICD-10-CM | POA: Diagnosis not present

## 2021-02-28 DIAGNOSIS — T148XXA Other injury of unspecified body region, initial encounter: Secondary | ICD-10-CM

## 2021-02-28 DIAGNOSIS — W109XXA Fall (on) (from) unspecified stairs and steps, initial encounter: Secondary | ICD-10-CM | POA: Diagnosis not present

## 2021-02-28 DIAGNOSIS — S29001A Unspecified injury of muscle and tendon of front wall of thorax, initial encounter: Secondary | ICD-10-CM | POA: Diagnosis present

## 2021-02-28 DIAGNOSIS — Z20822 Contact with and (suspected) exposure to covid-19: Secondary | ICD-10-CM | POA: Diagnosis not present

## 2021-02-28 DIAGNOSIS — W25XXXA Contact with sharp glass, initial encounter: Secondary | ICD-10-CM | POA: Diagnosis not present

## 2021-02-28 DIAGNOSIS — Z79899 Other long term (current) drug therapy: Secondary | ICD-10-CM | POA: Diagnosis not present

## 2021-02-28 DIAGNOSIS — Y900 Blood alcohol level of less than 20 mg/100 ml: Secondary | ICD-10-CM | POA: Diagnosis not present

## 2021-02-28 DIAGNOSIS — S21111A Laceration without foreign body of right front wall of thorax without penetration into thoracic cavity, initial encounter: Secondary | ICD-10-CM | POA: Insufficient documentation

## 2021-02-28 LAB — I-STAT CHEM 8, ED
BUN: 5 mg/dL — ABNORMAL LOW (ref 6–20)
Calcium, Ion: 0.9 mmol/L — ABNORMAL LOW (ref 1.15–1.40)
Chloride: 104 mmol/L (ref 98–111)
Creatinine, Ser: 1.1 mg/dL (ref 0.61–1.24)
Glucose, Bld: 87 mg/dL (ref 70–99)
HCT: 37 % — ABNORMAL LOW (ref 39.0–52.0)
Hemoglobin: 12.6 g/dL — ABNORMAL LOW (ref 13.0–17.0)
Potassium: 3.9 mmol/L (ref 3.5–5.1)
Sodium: 142 mmol/L (ref 135–145)
TCO2: 27 mmol/L (ref 22–32)

## 2021-02-28 MED ORDER — FENTANYL CITRATE PF 50 MCG/ML IJ SOSY
50.0000 ug | PREFILLED_SYRINGE | Freq: Once | INTRAMUSCULAR | Status: AC
  Administered 2021-02-28: 50 ug via INTRAVENOUS

## 2021-02-28 MED ORDER — FENTANYL CITRATE (PF) 100 MCG/2ML IJ SOLN
INTRAMUSCULAR | Status: AC
  Filled 2021-02-28: qty 2
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
## 2021-03-01 ENCOUNTER — Encounter (HOSPITAL_COMMUNITY): Payer: Self-pay

## 2021-03-01 ENCOUNTER — Emergency Department (HOSPITAL_COMMUNITY)
Admission: EM | Admit: 2021-03-01 | Discharge: 2021-03-01 | Disposition: A | Discharge status: Home / Self Care | Payer: Medicaid Other | Source: Home / Self Care | Attending: Emergency Medicine | Admitting: Emergency Medicine

## 2021-03-01 ENCOUNTER — Other Ambulatory Visit: Payer: Self-pay

## 2021-03-01 ENCOUNTER — Emergency Department (HOSPITAL_COMMUNITY): Payer: Medicaid Other

## 2021-03-01 DIAGNOSIS — W25XXXA Contact with sharp glass, initial encounter: Secondary | ICD-10-CM | POA: Insufficient documentation

## 2021-03-01 DIAGNOSIS — Z79899 Other long term (current) drug therapy: Secondary | ICD-10-CM | POA: Insufficient documentation

## 2021-03-01 DIAGNOSIS — S21111A Laceration without foreign body of right front wall of thorax without penetration into thoracic cavity, initial encounter: Secondary | ICD-10-CM | POA: Insufficient documentation

## 2021-03-01 LAB — CBC WITH DIFFERENTIAL/PLATELET
Abs Immature Granulocytes: 0.05 10*3/uL (ref 0.00–0.07)
Basophils Absolute: 0.2 10*3/uL — ABNORMAL HIGH (ref 0.0–0.1)
Basophils Relative: 2 %
Eosinophils Absolute: 0.1 10*3/uL (ref 0.0–0.5)
Eosinophils Relative: 1 %
HCT: 33.4 % — ABNORMAL LOW (ref 39.0–52.0)
Hemoglobin: 10.7 g/dL — ABNORMAL LOW (ref 13.0–17.0)
Immature Granulocytes: 1 %
Lymphocytes Relative: 31 %
Lymphs Abs: 2.4 10*3/uL (ref 0.7–4.0)
MCH: 28.5 pg (ref 26.0–34.0)
MCHC: 32 g/dL (ref 30.0–36.0)
MCV: 89.1 fL (ref 80.0–100.0)
Monocytes Absolute: 0.6 10*3/uL (ref 0.1–1.0)
Monocytes Relative: 8 %
Neutro Abs: 4.4 10*3/uL (ref 1.7–7.7)
Neutrophils Relative %: 57 %
Platelets: 307 10*3/uL (ref 150–400)
RBC: 3.75 MIL/uL — ABNORMAL LOW (ref 4.22–5.81)
RDW: 13.9 % (ref 11.5–15.5)
WBC: 7.7 10*3/uL (ref 4.0–10.5)
nRBC: 0 % (ref 0.0–0.2)

## 2021-03-01 LAB — URINALYSIS, ROUTINE W REFLEX MICROSCOPIC
Bacteria, UA: NONE SEEN
Bilirubin Urine: NEGATIVE
Glucose, UA: NEGATIVE mg/dL
Ketones, ur: NEGATIVE mg/dL
Leukocytes,Ua: NEGATIVE
Nitrite: NEGATIVE
Protein, ur: NEGATIVE mg/dL
Specific Gravity, Urine: 1.03 (ref 1.005–1.030)
pH: 5 (ref 5.0–8.0)

## 2021-03-01 LAB — CBC
HCT: 36 % — ABNORMAL LOW (ref 39.0–52.0)
Hemoglobin: 11.7 g/dL — ABNORMAL LOW (ref 13.0–17.0)
MCH: 29.3 pg (ref 26.0–34.0)
MCHC: 32.5 g/dL (ref 30.0–36.0)
MCV: 90 fL (ref 80.0–100.0)
Platelets: 302 10*3/uL (ref 150–400)
RBC: 4 MIL/uL — ABNORMAL LOW (ref 4.22–5.81)
RDW: 13.8 % (ref 11.5–15.5)
WBC: 5.8 10*3/uL (ref 4.0–10.5)
nRBC: 0 % (ref 0.0–0.2)

## 2021-03-01 LAB — COMPREHENSIVE METABOLIC PANEL
ALT: 96 U/L — ABNORMAL HIGH (ref 0–44)
ALT: 97 U/L — ABNORMAL HIGH (ref 0–44)
AST: 220 U/L — ABNORMAL HIGH (ref 15–41)
AST: 235 U/L — ABNORMAL HIGH (ref 15–41)
Albumin: 3.3 g/dL — ABNORMAL LOW (ref 3.5–5.0)
Albumin: 3.9 g/dL (ref 3.5–5.0)
Alkaline Phosphatase: 101 U/L (ref 38–126)
Alkaline Phosphatase: 101 U/L (ref 38–126)
Anion gap: 11 (ref 5–15)
Anion gap: 12 (ref 5–15)
BUN: 6 mg/dL (ref 6–20)
BUN: <5 mg/dL — ABNORMAL LOW (ref 6–20)
CO2: 24 mmol/L (ref 22–32)
CO2: 27 mmol/L (ref 22–32)
Calcium: 7.8 mg/dL — ABNORMAL LOW (ref 8.9–10.3)
Calcium: 8.2 mg/dL — ABNORMAL LOW (ref 8.9–10.3)
Chloride: 105 mmol/L (ref 98–111)
Chloride: 97 mmol/L — ABNORMAL LOW (ref 98–111)
Creatinine, Ser: 0.63 mg/dL (ref 0.61–1.24)
Creatinine, Ser: 0.69 mg/dL (ref 0.61–1.24)
GFR, Estimated: >60 mL/min (ref >60)
GFR, Estimated: >60 mL/min (ref >60)
Glucose, Bld: 102 mg/dL — ABNORMAL HIGH (ref 70–99)
Glucose, Bld: 92 mg/dL (ref 70–99)
Potassium: 3.7 mmol/L (ref 3.5–5.1)
Potassium: 3.9 mmol/L (ref 3.5–5.1)
Sodium: 136 mmol/L (ref 135–145)
Sodium: 140 mmol/L (ref 135–145)
Total Bilirubin: 0.5 mg/dL (ref 0.3–1.2)
Total Bilirubin: 0.6 mg/dL (ref 0.3–1.2)
Total Protein: 7.5 g/dL (ref 6.5–8.1)
Total Protein: 8.2 g/dL — ABNORMAL HIGH (ref 6.5–8.1)

## 2021-03-01 LAB — RESP PANEL BY RT-PCR (FLU A&B, COVID) ARPGX2
Influenza A by PCR: NEGATIVE
Influenza B by PCR: NEGATIVE
SARS Coronavirus 2 by RT PCR: NEGATIVE

## 2021-03-01 LAB — PROTIME-INR
INR: 1.2 (ref 0.8–1.2)
Prothrombin Time: 14.7 seconds (ref 11.4–15.2)

## 2021-03-01 LAB — SAMPLE TO BLOOD BANK

## 2021-03-01 LAB — HEMOGLOBIN AND HEMATOCRIT, BLOOD
HCT: 31.7 % — ABNORMAL LOW (ref 39.0–52.0)
Hemoglobin: 10.7 g/dL — ABNORMAL LOW (ref 13.0–17.0)

## 2021-03-01 LAB — TYPE AND SCREEN
ABO/RH(D): A POS
Antibody Screen: NEGATIVE

## 2021-03-01 LAB — ETHANOL
Alcohol, Ethyl (B): 297 mg/dL — ABNORMAL HIGH (ref <10)
Alcohol, Ethyl (B): 451 mg/dL (ref <10)

## 2021-03-01 LAB — LACTIC ACID, PLASMA: Lactic Acid, Venous: 1.2 mmol/L (ref 0.5–1.9)

## 2021-03-01 MED ORDER — HALOPERIDOL LACTATE 5 MG/ML IJ SOLN
5.0000 mg | Freq: Once | INTRAMUSCULAR | Status: DC
  Filled 2021-03-01: qty 1

## 2021-03-01 MED ORDER — DIPHENHYDRAMINE HCL 50 MG/ML IJ SOLN
25.0000 mg | Freq: Once | INTRAMUSCULAR | Status: DC
  Filled 2021-03-01: qty 1

## 2021-03-01 MED ORDER — LIDOCAINE-EPINEPHRINE (PF) 2 %-1:200000 IJ SOLN
INTRAMUSCULAR | Status: AC
  Filled 2021-03-01: qty 20

## 2021-03-01 MED ORDER — CEFAZOLIN SODIUM-DEXTROSE 2-4 GM/100ML-% IV SOLN
2.0000 g | Freq: Once | INTRAVENOUS | Status: AC
  Administered 2021-03-01: 2 g via INTRAVENOUS

## 2021-03-01 MED ORDER — SODIUM CHLORIDE 0.9 % IV BOLUS
2000.0000 mL | Freq: Once | INTRAVENOUS | Status: AC
  Administered 2021-03-01: 2000 mL via INTRAVENOUS

## 2021-03-01 MED ORDER — IOHEXOL 350 MG/ML SOLN
75.0000 mL | Freq: Once | INTRAVENOUS | Status: AC | PRN
  Administered 2021-03-01: 75 mL via INTRAVENOUS
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
## 2021-03-01 NOTE — Discharge Instructions (Signed)
Clean laceration twice a day with soap and water but leave bandage on until tomorrow.  Sutures need to be removed in 7 to 10 days you can go to your family doctor or an urgent care
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
## 2021-03-01 NOTE — ED Notes (Signed)
Called [facility] [phone] patient Dad states he will come and get him
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
## 2021-03-01 NOTE — ED Notes (Signed)
Call Father for d/c [phone] or [phone] Thereasa Distance.

## 2021-03-01 NOTE — ED Notes (Signed)
Pt returned from CT, Ancef hung

## 2021-03-01 NOTE — ED Notes (Signed)
Pt asleep, noted to be calmer at this time. Pressures slightly softer, another H&H collected. 1L bolus finished infusing
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
## 2021-03-01 NOTE — ED Notes (Signed)
Pt O2 sat 88% RA. Pt placed on 2L . O2 sat now at 98%. RN notified.

## 2021-03-26 ENCOUNTER — Other Ambulatory Visit: Payer: Self-pay

## 2021-03-26 ENCOUNTER — Emergency Department (HOSPITAL_COMMUNITY)
Admission: EM | Admit: 2021-03-26 | Discharge: 2021-03-26 | Disposition: A | Discharge status: Home / Self Care | Payer: Medicaid Other | Attending: Emergency Medicine | Admitting: Emergency Medicine

## 2021-03-26 ENCOUNTER — Encounter (HOSPITAL_COMMUNITY): Payer: Self-pay | Admitting: Emergency Medicine

## 2021-03-26 DIAGNOSIS — S21311D Laceration without foreign body of right front wall of thorax with penetration into thoracic cavity, subsequent encounter: Secondary | ICD-10-CM | POA: Diagnosis not present

## 2021-03-26 DIAGNOSIS — Z4802 Encounter for removal of sutures: Secondary | ICD-10-CM | POA: Insufficient documentation

## 2021-03-26 DIAGNOSIS — Z4801 Encounter for change or removal of surgical wound dressing: Secondary | ICD-10-CM | POA: Diagnosis not present

## 2021-03-26 DIAGNOSIS — W268XXD Contact with other sharp object(s), not elsewhere classified, subsequent encounter: Secondary | ICD-10-CM | POA: Diagnosis not present

## 2021-03-26 DIAGNOSIS — Z5189 Encounter for other specified aftercare: Secondary | ICD-10-CM

## 2021-03-26 MED ORDER — LIDOCAINE-EPINEPHRINE (PF) 2 %-1:200000 IJ SOLN
INTRAMUSCULAR | Status: AC
  Filled 2021-03-26: qty 20

## 2021-03-26 NOTE — Discharge Instructions (Signed)
You were seen today for bleeding wound.  Part of your wound has ulcerated and will need to heal by secondary intention.  Keep dressing in place.  Avoid picking at the wound.  If you develop rebleed, apply direct pressure.  Apply bacitracin to help keep the wound moist.
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
## 2021-03-27 ENCOUNTER — Encounter (HOSPITAL_COMMUNITY): Payer: Self-pay | Admitting: Emergency Medicine

## 2021-03-27 ENCOUNTER — Emergency Department (HOSPITAL_COMMUNITY)
Admission: EM | Admit: 2021-03-27 | Discharge: 2021-03-27 | Disposition: A | Discharge status: Home / Self Care | Payer: Medicaid Other | Attending: Emergency Medicine | Admitting: Emergency Medicine

## 2021-03-27 ENCOUNTER — Other Ambulatory Visit: Payer: Self-pay

## 2021-03-27 DIAGNOSIS — Z4801 Encounter for change or removal of surgical wound dressing: Secondary | ICD-10-CM | POA: Insufficient documentation

## 2021-03-27 DIAGNOSIS — Z5189 Encounter for other specified aftercare: Secondary | ICD-10-CM

## 2021-03-27 MED ORDER — LIDOCAINE-EPINEPHRINE (PF) 2 %-1:200000 IJ SOLN
10.0000 mL | Freq: Once | INTRAMUSCULAR | Status: AC
  Administered 2021-03-27: 10 mL
  Filled 2021-03-27: qty 20
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
## 2021-03-27 NOTE — ED Notes (Signed)
Pt standing at nurse's desk in fast track agitated and ready to be discharged. Pt was given discharge papers.

## 2021-03-27 NOTE — ED Triage Notes (Signed)
Pt states laceration started bleeding again tonight.

## 2021-03-27 NOTE — ED Notes (Signed)
Wound covered and wrapped with gauze.

## 2021-03-27 NOTE — Discharge Instructions (Signed)
Your wound is healing well, please leave bandages,  do not remove for the next 48 hours, after that I would like you to remove the dressing and rinse the wound 1-2 times daily for next 8 days., before removing the gauze please soak it so that it comes off without pulling off the scab.    may use over-the-counter pain medication as needed.  Follow with PCP as needed.  Come back to the emergency department if you develop chest pain, shortness of breath, severe abdominal pain, uncontrolled nausea, vomiting, diarrhea.

## 2021-03-29 ENCOUNTER — Emergency Department (HOSPITAL_COMMUNITY)
Admission: EM | Admit: 2021-03-29 | Discharge: 2021-03-29 | Disposition: A | Discharge status: Home / Self Care | Payer: Medicaid Other | Attending: Emergency Medicine | Admitting: Emergency Medicine

## 2021-03-29 ENCOUNTER — Other Ambulatory Visit: Payer: Self-pay

## 2021-03-29 ENCOUNTER — Encounter (HOSPITAL_COMMUNITY): Payer: Self-pay

## 2021-03-29 DIAGNOSIS — Z79899 Other long term (current) drug therapy: Secondary | ICD-10-CM | POA: Diagnosis not present

## 2021-03-29 DIAGNOSIS — L7622 Postprocedural hemorrhage and hematoma of skin and subcutaneous tissue following other procedure: Secondary | ICD-10-CM | POA: Insufficient documentation

## 2021-03-29 DIAGNOSIS — I10 Essential (primary) hypertension: Secondary | ICD-10-CM | POA: Diagnosis not present

## 2021-03-29 DIAGNOSIS — S21111A Laceration without foreign body of right front wall of thorax without penetration into thoracic cavity, initial encounter: Secondary | ICD-10-CM | POA: Diagnosis not present

## 2021-03-29 DIAGNOSIS — W25XXXA Contact with sharp glass, initial encounter: Secondary | ICD-10-CM | POA: Diagnosis not present

## 2021-03-29 DIAGNOSIS — Z5189 Encounter for other specified aftercare: Secondary | ICD-10-CM

## 2021-03-29 DIAGNOSIS — T148XXA Other injury of unspecified body region, initial encounter: Secondary | ICD-10-CM

## 2021-03-29 DIAGNOSIS — Z4801 Encounter for change or removal of surgical wound dressing: Secondary | ICD-10-CM | POA: Diagnosis not present

## 2021-03-29 DIAGNOSIS — L98499 Non-pressure chronic ulcer of skin of other sites with unspecified severity: Secondary | ICD-10-CM | POA: Diagnosis not present

## 2021-03-29 DIAGNOSIS — S299XXA Unspecified injury of thorax, initial encounter: Secondary | ICD-10-CM | POA: Diagnosis present

## 2021-03-29 MED ORDER — LIDOCAINE-EPINEPHRINE (PF) 2 %-1:200000 IJ SOLN
INTRAMUSCULAR | Status: AC
  Filled 2021-03-29: qty 20
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
## 2021-03-29 NOTE — ED Notes (Signed)
Pt unable to stay for repeat vital signs due to needing to leave for transportation.

## 2021-03-29 NOTE — ED Provider Notes (Signed)
[facility] EMERGENCY DEPARTMENT Provider Note   CSN: [id number] Arrival date & time: 03/29/21  1107     History  Chief Complaint  Patient presents with   Wound Check    Damon Alexander is a 36 y.o. male.  Damon Alexander is a 36 y.o. male patient with history of hypertension, seizures, chest wound after he fell on a glass bottle, who presents with bleeding from right upper chest wound.  Initial wound occurred on 1/26 when patient fell on a glass bottle.  The wound was sutured closed and the next day but later came in on 2/21 at that time had had apparent wound dehiscence, sutures were removed.  At that time patient had some brisk arterial bleeding from a small exposed vessel, this was resolved with pressure and local and epinephrine, he was seen 2 days ago again with similar bleeding issue.  Patient reports every time he changes the dressing or sometimes just with certain movements while sleeping the area seems to start bleeding again. Not on blood thinners.  The history is provided by the patient.      Home Medications Prior to Admission medications   Medication Sig Start Date End Date Taking? Authorizing Provider  cephALEXin (KEFLEX) 500 MG capsule Take 1 capsule (500 mg total) by mouth 3 (three) times daily. Patient not taking: Reported on 03/01/2021 08/12/19   Raeford Razor, MD  doxycycline (VIBRAMYCIN) 100 MG capsule Take 1 capsule (100 mg total) by mouth 2 (two) times daily. Patient not taking: Reported on 03/01/2021 10/26/20   Elson Areas, PA-C  DULoxetine (CYMBALTA) 60 MG capsule Take 60 mg by mouth daily. 12/15/19   [provider]  folic acid (FOLVITE) 1 MG tablet Take 1 mg by mouth daily. 03/16/20   [provider]  HYDROcodone-acetaminophen (NORCO) 5-325 MG tablet Take 1-2 tablets by mouth every 6 (six) hours as needed. 06/11/20   Geoffery Lyons, MD  magnesium oxide (MAG-OX) 400 (240 Mg) MG tablet Take 1 tablet by mouth 2 (two) times daily. 03/16/20    [provider]  naproxen (NAPROSYN) 500 MG tablet Take 1 tablet (500 mg total) by mouth 2 (two) times daily. 06/11/20   Geoffery Lyons, MD  olmesartan (BENICAR) 20 MG tablet Take 20 mg by mouth daily. 12/15/19   [provider]  thiamine (VITAMIN B-1) 100 MG tablet Take 100 mg by mouth daily. 03/16/20   [provider]      Allergies    Ciprofloxacin and Ciprofloxacin    Review of Systems   Review of Systems  Constitutional:  Negative for chills and fever.  Skin:  Positive for wound.   Physical Exam Updated Vital Signs BP (!) 143/100 (BP Location: Right Arm)    Pulse 97    Temp 98.1 F (36.7 C) (Oral)    Resp 17    Ht 5' 9.5" (1.765 m)    Wt 77.1 kg    SpO2 100%    BMI 24.74 kg/m  Physical Exam Vitals and nursing note reviewed.  Constitutional:      General: He is not in acute distress.    Appearance: Normal appearance. He is well-developed. He is not ill-appearing or diaphoretic.  HENT:     Head: Normocephalic and atraumatic.  Eyes:     General:        Right eye: No discharge.        Left eye: No discharge.  Cardiovascular:     Rate and Rhythm: Normal rate and  regular rhythm.  Pulmonary:     Effort: Pulmonary effort is normal. No respiratory distress.     Breath sounds: Normal breath sounds.  Chest:    Musculoskeletal:        General: No deformity.  Skin:    General: Skin is warm and dry.  Neurological:     Mental Status: He is alert and oriented to person, place, and time.     Coordination: Coordination normal.  Psychiatric:        Mood and Affect: Mood normal.        Behavior: Behavior normal.    ED Results / Procedures / Treatments   Labs (all labs ordered are listed, but only abnormal results are displayed) Labs Reviewed - No data to display  EKG None  Radiology No results found.  Procedures .Marland KitchenLaceration Repair  Date/Time: 03/29/2021 5:40 PM Performed by: Dartha Lodge, PA-C Authorized by: Dartha Lodge, PA-C   Consent:     Consent obtained:  Verbal   Consent given by:  Patient   Risks, benefits, and alternatives were discussed: yes     Risks discussed:  Pain, infection and poor wound healing   Alternatives discussed:  No treatment Universal protocol:    Procedure explained and questions answered to patient or proxy's satisfaction: yes     Patient identity confirmed:  Verbally with patient Anesthesia:    Anesthesia method:  Local infiltration   Local anesthetic:  Lidocaine 2% WITH epi Laceration details:    Location:  Trunk   Trunk location:  R chest Treatment:    Area cleansed with:  Chlorhexidine Skin repair:    Repair method:  Sutures   Suture size:  5-0   Suture material:  Nylon   Suture technique:  Figure eight   Number of sutures:  2 Approximation:    Approximation:  Close    Medications Ordered in ED Medications  lidocaine-EPINEPHrine (XYLOCAINE W/EPI) 2 %-1:200000 (PF) injection (  Given by Other 03/29/21 1201)    ED Course/ Medical Decision Making/ A&P                           Medical Decision Making  This patient presents to the ED for concern of bleeding wound, this involves an extensive number of treatment options, and is a complaint that carries with it a high risk of complications and morbidity.  The differential diagnosis includes wound dehiscence, nonhealing wound   MDM:     Patient presents with bleeding wound.  On my evaluation, wound appears that it is slightly ulcerated with very friable tissue and a brisk arterial bleed on the lateral aspect.  Suspect ulceration may be exposing vessel.  Bleeding does respond to pressure but with any disruption will start bleeding again and this is now patient's third ED visit for this issue.  Will place 2 figure-of-eight sutures to help get more long-term control of bleeding, after this patient had good hemostasis over the wound.  Monitored without recurrence of brisk arterial bleeding.  Dressing applied.   I discussed with the patient  that he will be at risk for rebleed.  Wound does not appear infected.  Encouraged him to use Vaseline or Aquaphor to keep the area moist and help avoid further ulceration or drying of the wound that may lead to exposure of other small vessels.  Suture removal in 7-10 days.  Do not feel that labs or imaging would change management at this time.  Additional history obtained from chart review.  External records from outside source obtained and reviewed including prior visits   Critical Interventions: Wound care    Reevaluation: After the interventions noted above, I reevaluated the patient and found that they have :resolved     Social Determinants of Health: Lives independent   Disposition: Discharge        Final Clinical Impression(s) / ED Diagnoses Final diagnoses:  Visit for wound check  Bleeding from wound    Rx / DC Orders ED Discharge Orders     None         Legrand Rams 03/29/21 Rogene Houston, MD 04/01/21 1313

## 2021-03-29 NOTE — Discharge Instructions (Addendum)
If you have any bleeding from the area apply direct pressure for 5 to 10 minutes, if bleeding is brisk and persist return to the emergency department.  We placed 2 figure-of-eight sutures to help reduce bleeding, these will need to be removed in 10 days and this can be done in the ED, at urgent care with your PCP.  This tissue is trying to heal and this has made it very fragile and more likely to bleed.  Try keeping the area moist by applying Aquaphor or Vaseline to the area with each dressing change, wash daily with soap and water.  Avoid using any hydrogen peroxide over the wound.

## 2021-04-09 ENCOUNTER — Other Ambulatory Visit: Payer: Self-pay

## 2021-04-09 ENCOUNTER — Emergency Department (HOSPITAL_COMMUNITY)
Admission: EM | Admit: 2021-04-09 | Discharge: 2021-04-09 | Disposition: A | Discharge status: Home / Self Care | Payer: Medicaid Other | Attending: Emergency Medicine | Admitting: Emergency Medicine

## 2021-04-09 ENCOUNTER — Encounter (HOSPITAL_COMMUNITY): Payer: Self-pay | Admitting: Emergency Medicine

## 2021-04-09 DIAGNOSIS — S299XXD Unspecified injury of thorax, subsequent encounter: Secondary | ICD-10-CM | POA: Diagnosis present

## 2021-04-09 DIAGNOSIS — X58XXXD Exposure to other specified factors, subsequent encounter: Secondary | ICD-10-CM | POA: Insufficient documentation

## 2021-04-09 DIAGNOSIS — Z4802 Encounter for removal of sutures: Secondary | ICD-10-CM | POA: Insufficient documentation

## 2021-04-09 DIAGNOSIS — S21301D Unspecified open wound of right front wall of thorax with penetration into thoracic cavity, subsequent encounter: Secondary | ICD-10-CM | POA: Diagnosis not present

## 2021-04-09 NOTE — ED Triage Notes (Signed)
Pt to the ED with a wound on his right upper chest. Pt states some of the stiches came out, however he is concerned that some may remain. ?  ?

## 2021-04-09 NOTE — ED Provider Notes (Signed)
?[place] EMERGENCY DEPARTMENT ?Provider Note ? ? ?CSN: [id number] ?Arrival date & time: 04/09/21  0944 ? ?  ? ?History ?Chief Complaint  ?Patient presents with  ? Suture / Staple Removal  ? ? ?Damon Alexander is a 36 y.o. male who presents to the ED for suture removal.  Sutures have been in place over the right anterior chest wall over the last 11 days.  He states that he was digging at it the other day and pieces of the suture material came off but wanted to get the rest of it removed. ? ? ?Suture / Staple Removal ? ? ?  ? ?Home Medications ?Prior to Admission medications   ?Medication Sig Start Date End Date Taking? Authorizing Provider  ?cephALEXin (KEFLEX) 500 MG capsule Take 1 capsule (500 mg total) by mouth 3 (three) times daily. ?Patient not taking: Reported on 03/01/2021 08/12/19   Damon Razor, MD  ?doxycycline (VIBRAMYCIN) 100 MG capsule Take 1 capsule (100 mg total) by mouth 2 (two) times daily. ?Patient not taking: Reported on 03/01/2021 10/26/20   Damon Areas, PA-C  ?DULoxetine (CYMBALTA) 60 MG capsule Take 60 mg by mouth daily. 12/15/19   [provider]  ?folic acid (FOLVITE) 1 MG tablet Take 1 mg by mouth daily. 03/16/20   [provider]  ?HYDROcodone-acetaminophen (NORCO) 5-325 MG tablet Take 1-2 tablets by mouth every 6 (six) hours as needed. 06/11/20   Damon Lyons, MD  ?magnesium oxide (MAG-OX) 400 (240 Mg) MG tablet Take 1 tablet by mouth 2 (two) times daily. 03/16/20   [provider]  ?naproxen (NAPROSYN) 500 MG tablet Take 1 tablet (500 mg total) by mouth 2 (two) times daily. 06/11/20   Damon Lyons, MD  ?olmesartan (BENICAR) 20 MG tablet Take 20 mg by mouth daily. 12/15/19   [provider]  ?thiamine (VITAMIN B-1) 100 MG tablet Take 100 mg by mouth daily. 03/16/20   [provider]  ?   ? ?Allergies    ?Ciprofloxacin and Ciprofloxacin   ? ?Review of Systems   ?Review of Systems  ?All other systems reviewed and are negative. ? ?Physical  Exam ?Updated Vital Signs ?BP (!) 141/87 (BP Location: Right Arm)   Pulse 86   Temp 98.4 ?F (36.9 ?C) (Oral)   Resp 20   Ht 5' 9.5" (1.765 m)   Wt 79.4 kg   SpO2 99%   BMI 25.47 kg/m?  ?Physical Exam ?Vitals and nursing note reviewed.  ?Constitutional:   ?   Appearance: Normal appearance.  ?HENT:  ?   Head: Normocephalic and atraumatic.  ?Eyes:  ?   General:     ?   Right eye: No discharge.     ?   Left eye: No discharge.  ?   Conjunctiva/sclera: Conjunctivae normal.  ?Pulmonary:  ?   Effort: Pulmonary effort is normal.  ?Skin: ?   General: Skin is warm and dry.  ?   Findings: No rash.  ?   Comments: Well-healing scab over the right anterior chest wall wound.  There is evidence of 1 piece of a tail of the suture visible.  I removed this.  No other evidence of suture material that I can see.  ?Neurological:  ?   General: No focal deficit present.  ?   Mental Status: He is alert.  ?Psychiatric:     ?   Mood and Affect: Mood normal.     ?   Behavior: Behavior normal.  ? ? ?ED Results /  Procedures / Treatments   ?Labs ?(all labs ordered are listed, but only abnormal results are displayed) ?Labs Reviewed - No data to display ? ?EKG ?None ? ?Radiology ?No results found. ? ?Procedures ?Marland KitchenSuture Removal ? ?Date/Time: 04/09/2021 10:57 AM ?Performed by: Teressa Lower, PA-C ?Authorized by: Teressa Lower, PA-C  ? ?Consent:  ?  Consent obtained:  Verbal ?  Consent given by:  Patient ?  Risks discussed:  Bleeding (unable to remove sutures) ?  Alternatives discussed:  No treatment ?Universal protocol:  ?  Procedure explained and questions answered to patient or proxy's satisfaction: yes   ?  Relevant documents present and verified: yes   ?  Patient identity confirmed:  Verbally with patient and arm band ?Location:  ?  Location:  Trunk ?  Trunk location:  Chest ?Procedure details:  ?  Wound appearance:  No signs of infection ?  Number of sutures removed:  1 ?Post-procedure details:  ?  Procedure completion:  Tolerated  well, no immediate complications ?Comments:  ?   I was only able to visualize a tail of 1 suture which I removed.  Other sutures not visible as there is a large scab and possible keloid formation over the right anterior chest wall.  I discussed that there still might be suture material intact and we discussed at the bedside risks and benefits of opening up the scab to remove these.  He states it is not bothering him and would like to leave it at this time.  ? ? ?Medications Ordered in ED ?Medications - No data to display ? ?ED Course/ Medical Decision Making/ A&P ?  ?                        ?Medical Decision Making ? ?Damon Alexander is a 36 y.o. male who presents the emergency department for suture removal.  All visible sutures were removed.  We discussed that there might be still sutures in place and that if they are bothering him in the future he can come in we can explore the wound and get them removed.  He is comfortable with having them and right now it is not causing him any pain or agitation.  Patient safer discharge. ? ?Final Clinical Impression(s) / ED Diagnoses ?Final diagnoses:  ?Visit for suture removal  ? ? ?Rx / DC Orders ?ED Discharge Orders   ? ? None  ? ?  ? ? ?  ?Teressa Lower, PA-C ?04/09/21 1102 ? ?  ?Bethann Berkshire, MD ?04/09/21 1751 ? ?

## 2021-04-09 NOTE — Discharge Instructions (Signed)
Please follow-up with your primary care doctor for further evaluation.  You may return anytime for any worsening symptoms or complaints. ?

## 2021-11-12 ENCOUNTER — Inpatient Hospital Stay (HOSPITAL_COMMUNITY): Payer: Medicaid Other

## 2021-11-12 ENCOUNTER — Other Ambulatory Visit: Payer: Self-pay

## 2021-11-12 ENCOUNTER — Emergency Department (HOSPITAL_COMMUNITY): Payer: Medicaid Other

## 2021-11-12 ENCOUNTER — Encounter (HOSPITAL_COMMUNITY): Payer: Self-pay

## 2021-11-12 ENCOUNTER — Inpatient Hospital Stay (HOSPITAL_COMMUNITY)
Admission: EM | Admit: 2021-11-12 | Discharge: 2021-11-13 | DRG: 101 | Disposition: A | Discharge status: Home / Self Care | Payer: Self-pay | Attending: Internal Medicine | Admitting: Internal Medicine

## 2021-11-12 DIAGNOSIS — Z79899 Other long term (current) drug therapy: Secondary | ICD-10-CM

## 2021-11-12 DIAGNOSIS — F17210 Nicotine dependence, cigarettes, uncomplicated: Secondary | ICD-10-CM | POA: Diagnosis present

## 2021-11-12 DIAGNOSIS — E876 Hypokalemia: Secondary | ICD-10-CM | POA: Diagnosis present

## 2021-11-12 DIAGNOSIS — S0101XA Laceration without foreign body of scalp, initial encounter: Secondary | ICD-10-CM | POA: Diagnosis present

## 2021-11-12 DIAGNOSIS — W010XXA Fall on same level from slipping, tripping and stumbling without subsequent striking against object, initial encounter: Secondary | ICD-10-CM | POA: Diagnosis present

## 2021-11-12 DIAGNOSIS — F1010 Alcohol abuse, uncomplicated: Secondary | ICD-10-CM | POA: Diagnosis present

## 2021-11-12 DIAGNOSIS — E871 Hypo-osmolality and hyponatremia: Secondary | ICD-10-CM | POA: Diagnosis present

## 2021-11-12 DIAGNOSIS — Z881 Allergy status to other antibiotic agents status: Secondary | ICD-10-CM

## 2021-11-12 DIAGNOSIS — Z5320 Procedure and treatment not carried out because of patient's decision for unspecified reasons: Secondary | ICD-10-CM | POA: Diagnosis present

## 2021-11-12 DIAGNOSIS — E119 Type 2 diabetes mellitus without complications: Secondary | ICD-10-CM | POA: Diagnosis present

## 2021-11-12 DIAGNOSIS — Z8673 Personal history of transient ischemic attack (TIA), and cerebral infarction without residual deficits: Secondary | ICD-10-CM

## 2021-11-12 DIAGNOSIS — I10 Essential (primary) hypertension: Secondary | ICD-10-CM | POA: Diagnosis present

## 2021-11-12 DIAGNOSIS — W19XXXA Unspecified fall, initial encounter: Secondary | ICD-10-CM

## 2021-11-12 DIAGNOSIS — R569 Unspecified convulsions: Principal | ICD-10-CM | POA: Diagnosis present

## 2021-11-12 DIAGNOSIS — E860 Dehydration: Secondary | ICD-10-CM | POA: Diagnosis present

## 2021-11-12 DIAGNOSIS — R0902 Hypoxemia: Secondary | ICD-10-CM | POA: Diagnosis present

## 2021-11-12 DIAGNOSIS — Z89512 Acquired absence of left leg below knee: Secondary | ICD-10-CM

## 2021-11-12 DIAGNOSIS — Z8249 Family history of ischemic heart disease and other diseases of the circulatory system: Secondary | ICD-10-CM

## 2021-11-12 LAB — URINALYSIS, ROUTINE W REFLEX MICROSCOPIC
Bacteria, UA: NONE SEEN
Bilirubin Urine: NEGATIVE
Glucose, UA: NEGATIVE mg/dL
Hgb urine dipstick: NEGATIVE
Ketones, ur: 5 mg/dL — AB
Leukocytes,Ua: NEGATIVE
Nitrite: NEGATIVE
Protein, ur: 100 mg/dL — AB
Specific Gravity, Urine: 1.028 (ref 1.005–1.030)
pH: 6 (ref 5.0–8.0)

## 2021-11-12 LAB — CBC WITH DIFFERENTIAL/PLATELET
Abs Immature Granulocytes: 0.03 10*3/uL (ref 0.00–0.07)
Basophils Absolute: 0.1 10*3/uL (ref 0.0–0.1)
Basophils Relative: 2 %
Eosinophils Absolute: 0.1 10*3/uL (ref 0.0–0.5)
Eosinophils Relative: 1 %
HCT: 37.8 % — ABNORMAL LOW (ref 39.0–52.0)
Hemoglobin: 12.7 g/dL — ABNORMAL LOW (ref 13.0–17.0)
Immature Granulocytes: 1 %
Lymphocytes Relative: 24 %
Lymphs Abs: 1.4 10*3/uL (ref 0.7–4.0)
MCH: 28.3 pg (ref 26.0–34.0)
MCHC: 33.6 g/dL (ref 30.0–36.0)
MCV: 84.4 fL (ref 80.0–100.0)
Monocytes Absolute: 0.6 10*3/uL (ref 0.1–1.0)
Monocytes Relative: 11 %
Neutro Abs: 3.5 10*3/uL (ref 1.7–7.7)
Neutrophils Relative %: 61 %
Platelets: 154 10*3/uL (ref 150–400)
RBC: 4.48 MIL/uL (ref 4.22–5.81)
RDW: 16.9 % — ABNORMAL HIGH (ref 11.5–15.5)
WBC: 5.7 10*3/uL (ref 4.0–10.5)
nRBC: 0 % (ref 0.0–0.2)

## 2021-11-12 LAB — BASIC METABOLIC PANEL
Anion gap: 16 — ABNORMAL HIGH (ref 5–15)
BUN: 8 mg/dL (ref 6–20)
CO2: 21 mmol/L — ABNORMAL LOW (ref 22–32)
Calcium: 9.5 mg/dL (ref 8.9–10.3)
Chloride: 94 mmol/L — ABNORMAL LOW (ref 98–111)
Creatinine, Ser: 0.8 mg/dL (ref 0.61–1.24)
GFR, Estimated: >60 mL/min (ref >60)
Glucose, Bld: 118 mg/dL — ABNORMAL HIGH (ref 70–99)
Potassium: 3.4 mmol/L — ABNORMAL LOW (ref 3.5–5.1)
Sodium: 131 mmol/L — ABNORMAL LOW (ref 135–145)

## 2021-11-12 LAB — RAPID URINE DRUG SCREEN, HOSP PERFORMED
Amphetamines: NOT DETECTED
Barbiturates: NOT DETECTED
Benzodiazepines: NOT DETECTED
Cocaine: NOT DETECTED
Opiates: NOT DETECTED
Tetrahydrocannabinol: POSITIVE — AB

## 2021-11-12 LAB — TYPE AND SCREEN
ABO/RH(D): A POS
Antibody Screen: NEGATIVE

## 2021-11-12 LAB — MAGNESIUM: Magnesium: 1.6 mg/dL — ABNORMAL LOW (ref 1.7–2.4)

## 2021-11-12 LAB — PROTIME-INR
INR: 1.2 (ref 0.8–1.2)
Prothrombin Time: 15 seconds (ref 11.4–15.2)

## 2021-11-12 LAB — ETHANOL: Alcohol, Ethyl (B): <10 mg/dL (ref <10)

## 2021-11-12 MED ORDER — MAGNESIUM SULFATE 2 GM/50ML IV SOLN
2.0000 g | Freq: Once | INTRAVENOUS | Status: AC
  Administered 2021-11-12: 2 g via INTRAVENOUS
  Filled 2021-11-12: qty 50

## 2021-11-12 MED ORDER — MORPHINE SULFATE (PF) 4 MG/ML IV SOLN
4.0000 mg | Freq: Once | INTRAVENOUS | Status: AC
Start: 2021-11-12 — End: 2021-11-12
  Administered 2021-11-12: 4 mg via INTRAVENOUS
  Filled 2021-11-12: qty 1

## 2021-11-12 MED ORDER — LORAZEPAM 2 MG/ML IJ SOLN: 0.0000 mg | Freq: Three times a day (TID) | INTRAMUSCULAR | Status: DC

## 2021-11-12 MED ORDER — THIAMINE MONONITRATE 100 MG PO TABS
100.0000 mg | ORAL_TABLET | Freq: Every day | ORAL | Status: DC
  Administered 2021-11-13: 100 mg via ORAL
  Filled 2021-11-12: qty 1

## 2021-11-12 MED ORDER — LORAZEPAM 2 MG/ML IJ SOLN
INTRAMUSCULAR | Status: AC
  Administered 2021-11-12: 2 mg via INTRAVENOUS
  Filled 2021-11-12: qty 1

## 2021-11-12 MED ORDER — LEVETIRACETAM IN NACL 1500 MG/100ML IV SOLN
1500.0000 mg | Freq: Once | INTRAVENOUS | Status: AC
  Administered 2021-11-12: 1500 mg via INTRAVENOUS
  Filled 2021-11-12: qty 100

## 2021-11-12 MED ORDER — LORAZEPAM 2 MG/ML IJ SOLN: 1.0000 mg | INTRAMUSCULAR | Status: DC | PRN

## 2021-11-12 MED ORDER — ONDANSETRON HCL 4 MG/2ML IJ SOLN
4.0000 mg | Freq: Once | INTRAMUSCULAR | Status: AC
  Administered 2021-11-12: 4 mg via INTRAVENOUS
  Filled 2021-11-12: qty 2

## 2021-11-12 MED ORDER — FENTANYL CITRATE PF 50 MCG/ML IJ SOSY
50.0000 ug | PREFILLED_SYRINGE | Freq: Once | INTRAMUSCULAR | Status: AC
  Administered 2021-11-12: 50 ug via INTRAVENOUS
  Filled 2021-11-12: qty 1

## 2021-11-12 MED ORDER — SODIUM CHLORIDE 0.9 % IV SOLN: INTRAVENOUS | Status: DC

## 2021-11-12 MED ORDER — LORAZEPAM 2 MG/ML IJ SOLN
0.0000 mg | INTRAMUSCULAR | Status: DC
  Administered 2021-11-12 – 2021-11-13 (×3): 1 mg via INTRAVENOUS
  Filled 2021-11-12 (×3): qty 1

## 2021-11-12 MED ORDER — THIAMINE HCL 100 MG/ML IJ SOLN: 100.0000 mg | Freq: Every day | INTRAMUSCULAR | Status: DC

## 2021-11-12 MED ORDER — LEVETIRACETAM 500 MG PO TABS
500.0000 mg | ORAL_TABLET | Freq: Two times a day (BID) | ORAL | Status: DC
  Administered 2021-11-12 – 2021-11-13 (×2): 500 mg via ORAL
  Filled 2021-11-12 (×2): qty 1

## 2021-11-12 MED ORDER — LORAZEPAM 2 MG/ML IJ SOLN
2.0000 mg | Freq: Once | INTRAMUSCULAR | Status: AC
  Administered 2021-11-12: 2 mg via INTRAVENOUS
  Filled 2021-11-12: qty 1

## 2021-11-12 MED ORDER — LORAZEPAM 2 MG/ML IJ SOLN: 2.0000 mg | Freq: Once | INTRAMUSCULAR | Status: AC

## 2021-11-12 MED ORDER — POTASSIUM CHLORIDE 10 MEQ/100ML IV SOLN
10.0000 meq | Freq: Once | INTRAVENOUS | Status: AC
  Administered 2021-11-12: 10 meq via INTRAVENOUS
  Filled 2021-11-12: qty 100

## 2021-11-12 MED ORDER — LORAZEPAM 1 MG PO TABS: 1.0000 mg | ORAL_TABLET | ORAL | Status: DC | PRN

## 2021-11-12 NOTE — ED Notes (Signed)
MRI was attempted but MRI tech reports pt was not able to hold still. Whenever he would try to place him into the MRI machine, he reports pt would jump up and try to get out. Attempted twice but unsuccessful. MRI tech reports to call them if pt becomes more alert and oriented in order to tolerate the imaging.

## 2021-11-12 NOTE — ED Notes (Signed)
Temperature deferred at this time due to pt resting.

## 2021-11-12 NOTE — ED Notes (Signed)
Patient transported to CT by RN.

## 2021-11-12 NOTE — ED Provider Notes (Signed)
[facility] EMERGENCY DEPARTMENT Provider Note   CSN: [id number] Arrival date & time: 11/12/21  1048     History  Chief Complaint  Patient presents with   Damon Alexander is a 36 y.o. 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   Patient has a prosthetic leg on the left that reportedly makes it hard for him to walk. He tripped and fell backwards, landing on the back of his head. Patient states he remembers the fall, did not have headache/visual changes/nausea/vomiting/palpitations/chest pain/lightheadedness prior to the fall. After striking his head, he had LOC at that time and subsequently witnessed seizure activity per bystanders. No urinary incontinence. EMS reports laceration to back of head with bleeding controlled w/ direct pressure.   On arrival to ED, patient reports 8/10 pain in his head a/w lightheadedness but with no visual changes, asymmetric numbness/tingling, weakness. He is actively vomiting. Had episode of bowel incontinence here in ED. A&Ox4 and alert but uncomfortable.  Reports that he has never had a seizure before.  Also denies alcohol withdrawal seizures.  Takes no medications at this time.  Alcohol or drug use today.    Fall       Home Medications Prior to Admission medications   Medication Sig Start Date End Date Taking? Authorizing Provider  cephALEXin (KEFLEX) 500 MG capsule Take 1 capsule (500 mg total) by mouth 3 (three) times daily. Patient not taking: Reported on 03/01/2021 08/12/19   Virgel Manifold, MD  doxycycline (VIBRAMYCIN) 100 MG capsule Take 1 capsule (100 mg total) by mouth 2 (two) times daily. Patient not taking: Reported on 03/01/2021 10/26/20   Fransico Meadow, PA-C  DULoxetine (CYMBALTA) 60 MG capsule Take 60 mg by mouth daily. Patient not taking: Reported on 11/12/2021 12/15/19   [provider]  folic acid (FOLVITE) 1 MG tablet Take 1 mg by mouth daily. Patient not  taking: Reported on 11/12/2021 03/16/20   [provider]  HYDROcodone-acetaminophen (NORCO) 5-325 MG tablet Take 1-2 tablets by mouth every 6 (six) hours as needed. Patient not taking: Reported on 11/12/2021 06/11/20   Veryl Speak, MD  magnesium oxide (MAG-OX) 400 (240 Mg) MG tablet Take 1 tablet by mouth 2 (two) times daily. Patient not taking: Reported on 11/12/2021 03/16/20   [provider]  naproxen (NAPROSYN) 500 MG tablet Take 1 tablet (500 mg total) by mouth 2 (two) times daily. Patient not taking: Reported on 11/12/2021 06/11/20   Veryl Speak, MD  olmesartan (BENICAR) 20 MG tablet Take 20 mg by mouth daily. Patient not taking: Reported on 11/12/2021 12/15/19   [provider]  thiamine (VITAMIN B-1) 100 MG tablet Take 100 mg by mouth daily. Patient not taking: Reported on 11/12/2021 03/16/20   [provider]      Allergies    Ciprofloxacin and Ciprofloxacin    Review of Systems   Review of Systems Review of systems positive for LOC.  A 10 point review of systems was performed and is negative unless otherwise reported in HPI.  Physical Exam Updated Vital Signs BP 137/86   Pulse (!) 109   Temp 98.6 F (37 C) (Oral)   Resp (!) 21   Ht 5' 9.5" (1.765 m)   Wt 71.7 kg   SpO2 95%   BMI 23.00 kg/m  Physical Exam General: Uncomfortable-appearing male, sitting up in bed.  HEENT: 8x8 cm raised hematoma to R crown of head w/ TTP, no  palpable skull depressions/deformities. Overlying 2 cm linear laceration on hematoma, hemostatic.  PERRLA, EOMI, no nystagmus, Sclera anicteric, MMM, trachea midline. No C-spine tenderness to palpation, no step-offs or deformities.  Cardiology: RRR, no murmurs/rubs/gallops. BL radial and DP pulses equal bilaterally.  Resp: Normal respiratory rate and effort. CTAB, no wheezes, rhonchi, crackles.  Abd: Soft, non-tender, non-distended. No rebound tenderness or guarding.  MSK: No peripheral edema or signs of trauma.  Extremities without deformity or TTP. No cyanosis or clubbing. Skin: warm, dry. No rashes or lesions. Neuro: A&Ox4, CNs II-XII grossly intact. MAEs. Sensation grossly intact. Intact coordination.  Psych: Uncomfortable,  ED Results / Procedures / Treatments   Labs (all labs ordered are listed, but only abnormal results are displayed) Labs Reviewed  CBC WITH DIFFERENTIAL/PLATELET - Abnormal; Notable for the following components:      Result Value   Hemoglobin 12.7 (*)    HCT 37.8 (*)    RDW 16.9 (*)    All other components within normal limits  BASIC METABOLIC PANEL - Abnormal; Notable for the following components:   Sodium 131 (*)    Potassium 3.4 (*)    Chloride 94 (*)    CO2 21 (*)    Glucose, Bld 118 (*)    Anion gap 16 (*)    All other components within normal limits  RAPID URINE DRUG SCREEN, HOSP PERFORMED - Abnormal; Notable for the following components:   Tetrahydrocannabinol POSITIVE (*)    All other components within normal limits  URINALYSIS, ROUTINE W REFLEX MICROSCOPIC - Abnormal; Notable for the following components:   Color, Urine AMBER (*)    APPearance HAZY (*)    Ketones, ur 5 (*)    Protein, ur 100 (*)    All other components within normal limits  MAGNESIUM - Abnormal; Notable for the following components:   Magnesium 1.6 (*)    All other components within normal limits  PROTIME-INR  ETHANOL  TYPE AND SCREEN    EKG EKG Interpretation  Date/Time:  Tuesday November 12 2021 11:02:17 EDT Ventricular Rate:  105 PR Interval:  135 QRS Duration: 93 QT Interval:  355 QTC Calculation: 470 R Axis:   76 Text Interpretation: Sinus tachycardia RSR' in V1 or V2, probably normal variant Consider left ventricular hypertrophy Borderline prolonged QT interval Confirmed by Cindee Lame [id number]) on 11/12/2021 4:05:32 PM  Radiology CT Head Wo Contrast  Result Date: 11/12/2021 CLINICAL DATA:  Fall with seizure, nausea; no history of seizures EXAM: CT HEAD WITHOUT  CONTRAST TECHNIQUE: Contiguous axial images were obtained from the base of the skull through the vertex without intravenous contrast. RADIATION DOSE REDUCTION: This exam was performed according to the departmental dose-optimization program which includes automated exposure control, adjustment of the mA and/or kV according to patient size and/or use of iterative reconstruction technique. COMPARISON:  10/25/2020 FINDINGS: Brain: No evidence of acute infarct, hemorrhage, mass, mass effect, or midline shift. No hydrocephalus or extra-axial fluid collection. Vascular: No hyperdense vessel. Skull: Negative for fracture or focal lesion. Right parietal scalp hematoma. Sinuses/Orbits: No acute finding. Remote right lamina papyracea fracture. Other: The mastoid air cells are well aerated. IMPRESSION: 1. No acute intracranial process. 2. Right parietal scalp hematoma. Electronically Signed   By: Merilyn Baba M.D.   On: 11/12/2021 11:20    Procedures .Critical Care  Performed by: Audley Hose, MD Authorized by: Audley Hose, MD   Critical care provider statement:    Critical care time (minutes):  50   Critical care  was necessary to treat or prevent imminent or life-threatening deterioration of the following conditions:  CNS failure or compromise   Critical care was time spent personally by me on the following activities:  Development of treatment plan with patient or surrogate, discussions with consultants, evaluation of patient's response to treatment, examination of patient, ordering and review of laboratory studies, ordering and review of radiographic studies, ordering and performing treatments and interventions, pulse oximetry, re-evaluation of patient's condition and review of old charts .Marland KitchenLaceration Repair  Date/Time: 11/12/2021 8:33 PM  Performed by: Audley Hose, MD Authorized by: Audley Hose, MD   Consent:    Consent obtained:  Verbal   Consent given by:  Patient   Risks discussed:   Infection, need for additional repair, nerve damage, poor wound healing, poor cosmetic result and pain   Alternatives discussed:  No treatment Universal protocol:    Procedure explained and questions answered to patient or proxy's satisfaction: yes     Test results available: yes     Required blood products, implants, devices, and special equipment available: yes     Patient identity confirmed:  Verbally with patient Anesthesia:    Anesthesia method:  None Laceration details:    Location:  Scalp   Scalp location:  Crown   Length (cm):  2   Depth (mm):  5 Pre-procedure details:    Preparation:  Imaging obtained to evaluate for foreign bodies Exploration:    Hemostasis achieved with:  Direct pressure   Imaging outcome: foreign body not noted     Wound exploration: wound explored through full range of motion     Wound extent: no fascia violation noted, no foreign bodies/material noted, no muscle damage noted, no nerve damage noted, no underlying fracture noted and no vascular damage noted     Contaminated: no   Treatment:    Amount of cleaning:  Standard   Irrigation solution:  Sterile saline   Irrigation volume:  500 Skin repair:    Repair method:  Staples   Number of staples:  2 Approximation:    Approximation:  Close Repair type:    Repair type:  Simple Post-procedure details:    Dressing:  Open (no dressing)   Procedure completion:  Tolerated well, no immediate complications     Medications Ordered in ED Medications  levETIRAcetam (KEPPRA) IVPB 1500 mg/ 100 mL premix (1,500 mg Intravenous New Bag/Given 11/12/21 1559)  ondansetron (ZOFRAN) injection 4 mg (4 mg Intravenous Given 11/12/21 1105)  fentaNYL (SUBLIMAZE) injection 50 mcg (50 mcg Intravenous Given 11/12/21 1141)  morphine (PF) 4 MG/ML injection 4 mg (4 mg Intravenous Given 11/12/21 1332)  LORazepam (ATIVAN) injection 2 mg (2 mg Intravenous Given 11/12/21 1518)  LORazepam (ATIVAN) injection 2 mg (2 mg Intravenous  Given 11/12/21 1555)    ED Course/ Medical Decision Making/ A&P                          Medical Decision Making Amount and/or Complexity of Data Reviewed Labs: ordered. Radiology: ordered. Decision-making details documented in ED Course.  Risk Prescription drug management. Decision regarding hospitalization.   Patient presents with mechanical fall then LOC and seizure activity followed by post-ictal phase and vomiting.  Patient states he remembers having a mechanical fall, that he tripped because his Prosthetic leg got caught And he fell backwards. Low concern at this time for syncope Causing the patient to fall, as he remembers clearly everything up until the fall. He  reportedly had seizure like activity during his loss of consciousness and woke up very nauseated. Great concern on patient arrival for skull fracture, intercranial hemorrhage, or mild TBI. His seizure, it is in the setting of trauma, consider highly ICH, however, must consider other causes such as drug use, hypoglycemia, electrolyte abnormalities, underlying epilepsy, brain tumor.  Patient has no other signs or symptoms of alcohol or benzodiazepine withdrawal, no concern for withdrawal seizure at this time.  Patient is wheeled directly to the CT scanner where he had a negative noncontrast head CT. He is given zofran for his nausea and IV analgesics and improves. His labs demonstrate unremarkable glucose, PT/INR, UA. CBC w/ Hgb 12.7 and no leukocytosis. BMP with Na 131, K 3.4, Cl 94, CO2 21, Cr 0.8, BUN 8, AG 16, Mg 1.6.  No hypoglycemia electrolyte abnormality significant enough to cause seizure activity.  I have personally reviewed and interpreted all labs and imaging.   Clinical Course as of 11/12/21 [place] Nov 12, 2021  1123 CT Head Wo Contrast 1. No acute intracranial process. 2. Right parietal scalp hematoma. [HN]  [id number] Patient with negative head CT but still with severe headache. [HN]  1504 Patient reevaluated  and scalp wound closed with staples.  Patient states he feels much better with the wound closed and that he is ready to go home. [HN]  1514 Called to the room notified the patient was having a seizure.  Patient reportedly had clonic movements of all 4 extremities followed by heavy breathing with left gaze deviation and drooling.  No loss of bladder or bowel.  Patient was unresponsive with left gaze deviation, tachypnea and hypoxia into the 80s, tachycardia into the 140s for Approximately for approximately 4 minutes.  Terminated with 2 mg IV Ativan.  Upon waking up, patient is confused, not oriented or answering questions at this time.  Patient be consulted to neurology as well as hospitalist for admission. [HN]    Clinical Course User Index [HN] Audley Hose, MD   D/w neurology, who recommends MRI brain with and without contrast, EEG, admission to hospitalist.  Patient is subsequently post ictal and trying to climb out of bed, pull the staples out of his head.  Given additional 2 mg of IV Ativan.  Patient is admitted to the hospitalist.  Seizure precautions ordered.        Final Clinical Impression(s) / ED Diagnoses Final diagnoses:  Fall, initial encounter  Laceration of scalp, initial encounter  Seizure [facility])    Rx / DC Orders ED Discharge Orders     None        This note was created using dictation software, which may contain spelling or grammatical errors.    Audley Hose, MD 11/12/21 2039

## 2021-11-12 NOTE — Progress Notes (Signed)
Called RN about EEG ordered.  Patient not cooperative at present.  Will attempt EEG tomorrow if patient is more cooperative as schedule permits.

## 2021-11-12 NOTE — ED Notes (Signed)
Pt confused, pulling cardiac monitor leads off and attempting to get out of bed to leave even after multiple attempts to redirect pt. MD notified by charge RN and order given for Ativan 2mg  IV.

## 2021-11-12 NOTE — ED Notes (Signed)
Pt hit the call bell and this RN walked into room to see what pt needed. Pt had already moved his cardiac monitor and pulling the leads off saying he was ready to go home. RN noticed staples had been placed already and pt probably was about to get discharged but discharge orders were not placed yet. RN went to take pt's temperature and noticed a behavior pause and then pt fell straight back down to the bed and became stiff, was jerking all over, and had clear secretions coming from pt's mouth. This RN yelled out for the secretary to call the MD due to pt seizing. Pt rolled onto his left side. MD came to bedside to assess and gave order to given Ativan 2mg  IV now, which was given by Fabio Neighbors, Charge RN. Generalized jerking remained for approximately 30 seconds prior to MD arriving at bedside. Afterwards, his jerking decreased, but was still going on for approximately 2 minutes. HR 160, O2 sat 97% on RA. Pt's mouth suctioned after seizure ended. Seizure pads placed on pt's side rails. Pt somnolent at this time. RN at bedside. Initial seizure started at approximately 1507.

## 2021-11-12 NOTE — ED Notes (Signed)
Pt ate half of sandwich and drank cup of water.

## 2021-11-12 NOTE — ED Triage Notes (Addendum)
Pt presents to ED via [facility] after fall this am. Pt fell, hit back of his head on asphalt, had LOC and witnessed seizure and foaming at the mouth per bystanders. Pt HR tachycardic with EMS, Laceration to back of head. Pt states he is now dizzy and has vomited x 1

## 2021-11-12 NOTE — ED Notes (Signed)
EDP at bedside  

## 2021-11-12 NOTE — H&P (Addendum)
TRH H&P    Patient Demographics:    Damon Alexander, is a 36 y.o. male  MRN: [id number]  DOB - 1985/06/03  Admit Date - 11/12/2021  Referring MD/NP/PA: Dr Mayra Neer  Outpatient Primary MD for the patient is [facility]  Patient coming from: Home  Chief complaint-seizure   HPI:    Damon Alexander  is a 36 y.o. male, with history of hypertension, alcohol abuse in the past,, osteomyelitis, s/p left BKA with prosthetic leg was brought to the hospital after patient tripped and fell backwards landing on the back of his head.  He had loss of consciousness and witnessed seizure activity per bystanders.  There was no urinary incontinence.  EMS was called, patient was found to have laceration to back of her head with bleeding controlled with direct pressure. On arrival to ED laceration was repaired by the ED provider.  Patient was about to leave AMA when he had another witnessed seizure.  CT head showed no intracranial process but showed right parietal scalp hematoma. Neurology was consulted, patient given Keppra 1500 mg IV x1, EEG and MRI brain with and without contrast ordered. At this time patient is postictal, confused Unable to provide any significant history    Review of systems:   As in HPI, rest of the review of systems unobtainable at this time    Past History of the following :    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/06/2016   Displaced pilon fracture of left tibia, subsequent encounter for open fracture type IIIA, IIIB, or IIIC with malunion 03/06/2016   HTN (hypertension) 12/05/2015   pt denies this   Medical history non-contributory    Nicotine dependence 10/12/2015   Seizures ([place])    due to a reaction from Cipro   Type III open fracture of left tibial plafond with involvement of fibula 10/12/2015      Past Surgical History:  Procedure  Laterality Date   AMPUTATION Left 08/02/2017   Procedure: AMPUTATION BELOW KNEE;  Surgeon: Newt Minion, MD;  Location: [place];  Service: Orthopedics;  Laterality: Left;   ANKLE FUSION Left 02/12/2016   Procedure: ARTHRODESIS ANKLE;  Surgeon: Altamese Pleasanton, MD;  Location: [place];  Service: Orthopedics;  Laterality: Left;   APPLICATION OF WOUND VAC Left 10/05/2015   Procedure: APPLICATION OF WOUND VAC;  Surgeon: Meredith Pel, MD;  Location: [place];  Service: Orthopedics;  Laterality: Left;   APPLICATION OF WOUND VAC Left 10/09/2015   Procedure: APPLICATION OF WOUND VAC;  Surgeon: Altamese Brooks, MD;  Location: [place];  Service: Orthopedics;  Laterality: Left;   EXTERNAL FIXATION LEG Left 10/05/2015   Procedure: EXTERNAL FIXATION LEG LEFT ANKLE & LEFT LOWER LEG;  Surgeon: Meredith Pel, MD;  Location: [place];  Service: Orthopedics;  Laterality: Left;   EXTERNAL FIXATION REMOVAL Left 12/04/2015   Procedure: REMOVAL EXTERNAL FIXATION LEFT LEG;  Surgeon: Altamese Wiconsico, MD;  Location: [place];  Service: Orthopedics;  Laterality: Left;   I & D  EXTREMITY Left 10/05/2015   Procedure: IRRIGATION AND DEBRIDEMENT EXTREMITY LEFT ANKLE & LEFT  LOWER LEG,PLACEMENT OF ANTIBIOTIC BEADS;  Surgeon: Cammy Copa, MD;  Location: [facility];  Service: Orthopedics;  Laterality: Left;   I & D EXTREMITY Left 10/09/2015   Procedure: IRRIGATION AND DEBRIDEMENT LEFT ANKLE POSSIBLE EX-FIX ADJUSTMENT;  Surgeon: Myrene Galas, MD;  Location: [facility] OR;  Service: Orthopedics;  Laterality: Left;   I & D EXTREMITY Left 07/27/2016   Procedure: IRRIGATION AND DEBRIDEMENT EXTREMITY;  Surgeon: Sheral Apley, MD;  Location: [facility] OR;  Service: Orthopedics;  Laterality: Left;   LEG SURGERY     "rod in my right leg"   MANDIBLE FRACTURE SURGERY     NO PAST SURGERIES     SKIN SPLIT GRAFT Left 12/04/2015   Procedure: SKIN GRAFT SPLIT THICKNESS LEFT LEG;  Surgeon: Myrene Galas, MD;  Location[facility] OR;  Service: Orthopedics;  Laterality: Left;    TIBIA IM NAIL INSERTION Right 07/31/2012   Procedure: INTRAMEDULLARY (IM) NAIL TIBIAL;  Surgeon: Nadara Mustard, MD;  Location: [facility];  Service: Orthopedics;  Laterality: Right;  Intramedullary Nail right tib/fib      Social History:      Social History   Tobacco Use   Smoking status: Every Day    Packs/day: 0.50    Types: Cigarettes   Smokeless tobacco: Never  Substance Use Topics   Alcohol use: Yes    Alcohol/week: 2.0 standard drinks of alcohol    Types: 2 Cans of beer per week       Family History :     Family History  Problem Relation Age of Onset   Hypertension Mother       Home Medications:   Prior to Admission medications   Medication Sig Start Date End Date Taking? Authorizing Provider  cephALEXin (KEFLEX) 500 MG capsule Take 1 capsule (500 mg total) by mouth 3 (three) times daily. Patient not taking: Reported on 03/01/2021 08/12/19   Raeford Razor, MD  doxycycline (VIBRAMYCIN) 100 MG capsule Take 1 capsule (100 mg total) by mouth 2 (two) times daily. Patient not taking: Reported on 03/01/2021 10/26/20   Elson Areas, PA-C  DULoxetine (CYMBALTA) 60 MG capsule Take 60 mg by mouth daily. Patient not taking: Reported on 11/12/2021 12/15/19   [provider]  folic acid (FOLVITE) 1 MG tablet Take 1 mg by mouth daily. Patient not taking: Reported on 11/12/2021 03/16/20   [provider]  HYDROcodone-acetaminophen (NORCO) 5-325 MG tablet Take 1-2 tablets by mouth every 6 (six) hours as needed. Patient not taking: Reported on 11/12/2021 06/11/20   Geoffery Lyons, MD  magnesium oxide (MAG-OX) 400 (240 Mg) MG tablet Take 1 tablet by mouth 2 (two) times daily. Patient not taking: Reported on 11/12/2021 03/16/20   [provider]  naproxen (NAPROSYN) 500 MG tablet Take 1 tablet (500 mg total) by mouth 2 (two) times daily. Patient not taking: Reported on 11/12/2021 06/11/20   Geoffery Lyons, MD  olmesartan (BENICAR) 20 MG tablet Take 20 mg by mouth  daily. Patient not taking: Reported on 11/12/2021 12/15/19   [provider]  thiamine (VITAMIN B-1) 100 MG tablet Take 100 mg by mouth daily. Patient not taking: Reported on 11/12/2021 03/16/20   [provider]     Allergies:     Allergies  Allergen Reactions   Ciprofloxacin Other (See Comments)    Caused a seizure   Ciprofloxacin      Physical Exam:   Vitals  Blood pressure 137/86, pulse (!) 109, temperature 98.6 F (37 C), temperature source Oral, resp. rate (!) 21, height 5' 9.5" (1.765 m), weight 71.7 kg, SpO2 95 %.  1.  General: Appears in no acute distress  2. Psychiatric: Alert, not oriented x3, confused  3. Neurologic: Alert, moving all extremities, follows some commands, confused  4. HEENMT:  Laceration at the back of head, repaired in the ED  5. Respiratory : Clear to auscultation bilaterally  6. Cardiovascular : S1-S2, regular, no murmur auscultated  7. Gastrointestinal:  Abdomen is soft, nontender,     Data Review:    CBC Recent Labs  Lab 11/12/21 1104  WBC 5.7  HGB 12.7*  HCT 37.8*  PLT 154  MCV 84.4  MCH 28.3  MCHC 33.6  RDW 16.9*  LYMPHSABS 1.4  MONOABS 0.6  EOSABS 0.1  BASOSABS 0.1   ------------------------------------------------------------------------------------------------------------------  Results for orders placed or performed during the hospital encounter of 11/12/21 (from the past 48 hour(s))  CBC with Differential     Status: Abnormal   Collection Time: 11/12/21 11:04 AM  Result Value Ref Range   WBC 5.7 4.0 - 10.5 K/uL   RBC 4.48 4.22 - 5.81 MIL/uL   Hemoglobin 12.7 (L) 13.0 - 17.0 g/dL   HCT 16.137.8 (L) 09.639.0 - 04.552.0 %   MCV 84.4 80.0 - 100.0 fL   MCH 28.3 26.0 - 34.0 pg   MCHC 33.6 30.0 - 36.0 g/dL   RDW 40.916.9 (H) 81.111.5 - 91.415.5 %   Platelets 154 150 - 400 K/uL   nRBC 0.0 0.0 - 0.2 %   Neutrophils Relative % 61 %   Neutro Abs 3.5 1.7 - 7.7 K/uL   Lymphocytes Relative 24 %   Lymphs Abs 1.4 0.7  - 4.0 K/uL   Monocytes Relative 11 %   Monocytes Absolute 0.6 0.1 - 1.0 K/uL   Eosinophils Relative 1 %   Eosinophils Absolute 0.1 0.0 - 0.5 K/uL   Basophils Relative 2 %   Basophils Absolute 0.1 0.0 - 0.1 K/uL   Immature Granulocytes 1 %   Abs Immature Granulocytes 0.03 0.00 - 0.07 K/uL    Comment: Performed at [facility], [address] [place], [place] [address]  Basic metabolic panel     Status: Abnormal   Collection Time: 11/12/21 11:04 AM  Result Value Ref Range   Sodium 131 (L) 135 - 145 mmol/L   Potassium 3.4 (L) 3.5 - 5.1 mmol/L   Chloride 94 (L) 98 - 111 mmol/L   CO2 21 (L) 22 - 32 mmol/L   Glucose, Bld 118 (H) 70 - 99 mg/dL    Comment: Glucose reference range applies only to samples taken after fasting for at least 8 hours.   BUN 8 6 - 20 mg/dL   Creatinine, Ser 6.210.80 0.61 - 1.24 mg/dL   Calcium 9.5 8.9 - 30.810.3 mg/dL   GFR, Estimated >65>60 >78>60 mL/min    Comment: (NOTE) Calculated using the CKD-EPI Creatinine Equation (2021)    Anion gap 16 (H) 5 - 15    Comment: Performed at [facility], [address] [place], [place] [address]  Protime-INR     Status: None   Collection Time: 11/12/21 11:04 AM  Result Value Ref Range   Prothrombin Time 15.0 11.4 - 15.2 seconds   INR 1.2 0.8 - 1.2    Comment: (NOTE) INR goal varies based on device and disease states. Performed at [facility], [address] [place], [place] [address]  Rapid urine drug screen (hospital performed)     Status: Abnormal   Collection Time: 11/12/21 11:04 AM  Result Value Ref Range   Opiates NONE DETECTED NONE DETECTED   Cocaine NONE DETECTED NONE DETECTED   Benzodiazepines NONE DETECTED NONE DETECTED   Amphetamines NONE DETECTED NONE DETECTED   Tetrahydrocannabinol POSITIVE (A) NONE DETECTED   Barbiturates NONE DETECTED NONE DETECTED    Comment: (NOTE) DRUG SCREEN FOR MEDICAL PURPOSES ONLY.  IF CONFIRMATION IS NEEDED FOR ANY PURPOSE, NOTIFY LAB WITHIN 5 DAYS.  LOWEST DETECTABLE LIMITS FOR  URINE DRUG SCREEN Drug Class                     Cutoff (ng/mL) Amphetamine and metabolites    1000 Barbiturate and metabolites    200 Benzodiazepine                 200 Opiates and metabolites        300 Cocaine and metabolites        300 THC                            50 Performed at [facility], [address], [place], [place] [address]   Urinalysis, Routine w reflex microscopic Urine, Random     Status: Abnormal   Collection Time: 11/12/21 11:04 AM  Result Value Ref Range   Color, Urine AMBER (A) YELLOW    Comment: BIOCHEMICALS MAY BE AFFECTED BY COLOR   APPearance HAZY (A) CLEAR   Specific Gravity, Urine 1.028 1.005 - 1.030   pH 6.0 5.0 - 8.0   Glucose, UA NEGATIVE NEGATIVE mg/dL   Hgb urine dipstick NEGATIVE NEGATIVE   Bilirubin Urine NEGATIVE NEGATIVE   Ketones, ur 5 (A) NEGATIVE mg/dL   Protein, ur 950 (A) NEGATIVE mg/dL   Nitrite NEGATIVE NEGATIVE   Leukocytes,Ua NEGATIVE NEGATIVE   RBC / HPF 0-5 0 - 5 RBC/hpf   WBC, UA 0-5 0 - 5 WBC/hpf   Bacteria, UA NONE SEEN NONE SEEN   Mucus PRESENT     Comment: Performed at [facility], [address], [place], [place] [address]  Magnesium     Status: Abnormal   Collection Time: 11/12/21 11:04 AM  Result Value Ref Range   Magnesium 1.6 (L) 1.7 - 2.4 mg/dL    Comment: Performed at [facility], [address], [place], [place] [address]  Ethanol     Status: None   Collection Time: 11/12/21 11:14 AM  Result Value Ref Range   Alcohol, Ethyl (B) <10 <10 mg/dL    Comment: (NOTE) Lowest detectable limit for serum alcohol is 10 mg/dL.  For medical purposes only. Performed at [facility], [address], [place], [place] [address]   Type and screen [facility]     Status: None   Collection Time: 11/12/21 11:26 AM  Result Value Ref Range   ABO/RH(D) A POS    Antibody Screen NEG    Sample Expiration      11/15/2021,2359 Performed at [facility], [address] [place], [place] [address]     Chemistries  Recent  Labs  Lab 11/12/21 1104  NA 131*  K 3.4*  CL 94*  CO2 21*  GLUCOSE 118*  BUN 8  CREATININE 0.80  CALCIUM 9.5  MG 1.6*   ------------------------------------------------------------------------------------------------------------------  ------------------------------------------------------------------------------------------------------------------ GFR: Estimated Creatinine Clearance: 130.7 mL/min (by C-G formula based on SCr of 0.8 mg/dL). Liver Function Tests:  No results for input(s): "AST", "ALT", "ALKPHOS", "BILITOT", "PROT", "ALBUMIN" in the last 168 hours. No results for input(s): "LIPASE", "AMYLASE" in the last 168 hours. No results for input(s): "AMMONIA" in the last 168 hours. Coagulation Profile: Recent Labs  Lab 11/12/21 1104  INR 1.2   Cardiac Enzymes: No results for input(s): "CKTOTAL", "CKMB", "CKMBINDEX", "TROPONINI" in the last 168 hours. BNP (last 3 results) No results for input(s): "PROBNP" in the last 8760 hours. HbA1C: No results for input(s): "HGBA1C" in the last 72 hours. CBG: No results for input(s): "GLUCAP" in the last 168 hours. Lipid Profile: No results for input(s): "CHOL", "HDL", "LDLCALC", "TRIG", "CHOLHDL", "LDLDIRECT" in the last 72 hours. Thyroid Function Tests: No results for input(s): "TSH", "T4TOTAL", "FREET4", "T3FREE", "THYROIDAB" in the last 72 hours. Anemia Panel: No results for input(s): "VITAMINB12", "FOLATE", "FERRITIN", "TIBC", "IRON", "RETICCTPCT" in the last 72 hours.  --------------------------------------------------------------------------------------------------------------- Urine analysis:    Component Value Date/Time   COLORURINE AMBER (A) 11/12/2021 1104   APPEARANCEUR HAZY (A) 11/12/2021 1104   LABSPEC 1.028 11/12/2021 1104   PHURINE 6.0 11/12/2021 1104   GLUCOSEU NEGATIVE 11/12/2021 1104   HGBUR NEGATIVE 11/12/2021 1104   BILIRUBINUR NEGATIVE 11/12/2021 1104   KETONESUR 5 (A) 11/12/2021 1104   PROTEINUR 100  (A) 11/12/2021 1104   UROBILINOGEN 0.2 08/18/2011 0748   NITRITE NEGATIVE 11/12/2021 1104   LEUKOCYTESUR NEGATIVE 11/12/2021 1104      Imaging Results:    CT Head Wo Contrast  Result Date: 11/12/2021 CLINICAL DATA:  Fall with seizure, nausea; no history of seizures EXAM: CT HEAD WITHOUT CONTRAST TECHNIQUE: Contiguous axial images were obtained from the base of the skull through the vertex without intravenous contrast. RADIATION DOSE REDUCTION: This exam was performed according to the departmental dose-optimization program which includes automated exposure control, adjustment of the mA and/or kV according to patient size and/or use of iterative reconstruction technique. COMPARISON:  10/25/2020 FINDINGS: Brain: No evidence of acute infarct, hemorrhage, mass, mass effect, or midline shift. No hydrocephalus or extra-axial fluid collection. Vascular: No hyperdense vessel. Skull: Negative for fracture or focal lesion. Right parietal scalp hematoma. Sinuses/Orbits: No acute finding. Remote right lamina papyracea fracture. Other: The mastoid air cells are well aerated. IMPRESSION: 1. No acute intracranial process. 2. Right parietal scalp hematoma. Electronically Signed   By: Wiliam Ke M.D.   On: 11/12/2021 11:20    EKG-sinus rhythm, LVH, QTc 486   Assessment & Plan:    Principal Problem:   Seizure (H[facility])   Seizure-likely new onset seizure, after direct trauma to the head from fall.  CT scan showed right parietal scalp hematoma otherwise no intracranial process.  Neurology was consulted, recommended EEG and MRI brain with and without contrast.  Keppra loaded with 1500 mg IV x1 given in the ED.  We will continue with Keppra 500 mg p.o. twice daily.  Consider consulting tele neurology in a.m. ?  Alcohol withdrawal-patient has history of alcohol abuse in the past.  Unable to obtain any significant history at this time due to confusion after seizure.  We will start Ativan per CIWA protocol, continue  thiamine.  Alcohol level obtained in the ED was less than 10. Hypertension-blood pressure is stable, will start hydralazine 10 mg p.o. every 4 hours as needed Hypomagnesemia-magnesium level is 1.6.  Will replace magnesium with 2 g magnesium sulfate IV. Hypokalemia-potassium is 3.4, will replace potassium with IV KCl 10 meq x 1. Mild hyponatremia-likely from dehydration, sodium 131.  Started on normal saline at 100 mm/h.  Follow BMP in am. Mild QTc prolongation-QTc is mildly prolonged at 486 ms. patient has low magnesium and potassium as above.  Being replaced as above.  Continue cardiac monitoring.    DVT Prophylaxis-   SCDs   AM Labs Ordered, also please review Full Orders  Family Communication: No family at bedside, presumed full code  Code Status: Full code  Admission status: Inpatient :The appropriate admission status for this patient is INPATIENT. Inpatient status is judged to be reasonable and necessary in order to provide the required intensity of service to ensure the patient's safety. The patient's presenting symptoms, physical exam findings, and initial radiographic and laboratory data in the context of their chronic comorbidities is felt to place them at high risk for further clinical deterioration. Furthermore, it is not anticipated that the patient will be medically stable for discharge from the hospital within 2 midnights of admission. The following factors support the admission status of inpatient.      * I certify that at the point of admission it is my clinical judgment that the patient will require inpatient hospital care spanning beyond 2 midnights from the point of admission due to high intensity of service, high risk for further deterioration and high frequency of surveillance required.*  Time spent in minutes : 60 min   Tyhir Schwan S Minas Bonser M.D

## 2021-11-13 ENCOUNTER — Inpatient Hospital Stay (HOSPITAL_COMMUNITY): Payer: Medicaid Other

## 2021-11-13 DIAGNOSIS — W19XXXA Unspecified fall, initial encounter: Secondary | ICD-10-CM

## 2021-11-13 DIAGNOSIS — S0101XA Laceration without foreign body of scalp, initial encounter: Secondary | ICD-10-CM

## 2021-11-13 DIAGNOSIS — F1010 Alcohol abuse, uncomplicated: Secondary | ICD-10-CM

## 2021-11-13 LAB — CBC
HCT: 33.8 % — ABNORMAL LOW (ref 39.0–52.0)
Hemoglobin: 11.6 g/dL — ABNORMAL LOW (ref 13.0–17.0)
MCH: 28.7 pg (ref 26.0–34.0)
MCHC: 34.3 g/dL (ref 30.0–36.0)
MCV: 83.7 fL (ref 80.0–100.0)
Platelets: 131 10*3/uL — ABNORMAL LOW (ref 150–400)
RBC: 4.04 MIL/uL — ABNORMAL LOW (ref 4.22–5.81)
RDW: 16.6 % — ABNORMAL HIGH (ref 11.5–15.5)
WBC: 5.1 10*3/uL (ref 4.0–10.5)
nRBC: 0 % (ref 0.0–0.2)

## 2021-11-13 LAB — COMPREHENSIVE METABOLIC PANEL
ALT: 46 U/L — ABNORMAL HIGH (ref 0–44)
AST: 128 U/L — ABNORMAL HIGH (ref 15–41)
Albumin: 3.9 g/dL (ref 3.5–5.0)
Alkaline Phosphatase: 73 U/L (ref 38–126)
Anion gap: 11 (ref 5–15)
BUN: 7 mg/dL (ref 6–20)
CO2: 23 mmol/L (ref 22–32)
Calcium: 9 mg/dL (ref 8.9–10.3)
Chloride: 98 mmol/L (ref 98–111)
Creatinine, Ser: 0.64 mg/dL (ref 0.61–1.24)
GFR, Estimated: >60 mL/min (ref >60)
Glucose, Bld: 135 mg/dL — ABNORMAL HIGH (ref 70–99)
Potassium: 3.1 mmol/L — ABNORMAL LOW (ref 3.5–5.1)
Sodium: 132 mmol/L — ABNORMAL LOW (ref 135–145)
Total Bilirubin: 1.4 mg/dL — ABNORMAL HIGH (ref 0.3–1.2)
Total Protein: 7.3 g/dL (ref 6.5–8.1)

## 2021-11-13 LAB — MAGNESIUM: Magnesium: 2.2 mg/dL (ref 1.7–2.4)

## 2021-11-13 MED ORDER — MAGNESIUM OXIDE -MG SUPPLEMENT 400 (240 MG) MG PO TABS: 1.0000 | ORAL_TABLET | Freq: Two times a day (BID) | ORAL | 2 refills | Status: AC

## 2021-11-13 MED ORDER — POTASSIUM CHLORIDE CRYS ER 20 MEQ PO TBCR
40.0000 meq | EXTENDED_RELEASE_TABLET | ORAL | Status: DC
  Administered 2021-11-13: 40 meq via ORAL
  Filled 2021-11-13: qty 2

## 2021-11-13 MED ORDER — THIAMINE MONONITRATE 100 MG PO TABS: 100.0000 mg | ORAL_TABLET | Freq: Every day | ORAL | 2 refills | Status: AC

## 2021-11-13 MED ORDER — FOLIC ACID 1 MG PO TABS: 1.0000 mg | ORAL_TABLET | Freq: Every day | ORAL | 2 refills | Status: AC

## 2021-11-13 MED ORDER — GADOBUTROL 1 MMOL/ML IV SOLN
7.0000 mL | Freq: Once | INTRAVENOUS | Status: AC | PRN
  Administered 2021-11-13: 7 mL via INTRAVENOUS

## 2021-11-13 MED ORDER — LEVETIRACETAM 500 MG PO TABS: 500.0000 mg | ORAL_TABLET | Freq: Two times a day (BID) | ORAL | 1 refills | Status: AC

## 2021-11-13 MED ORDER — POTASSIUM CHLORIDE CRYS ER 20 MEQ PO TBCR: 40.0000 meq | EXTENDED_RELEASE_TABLET | Freq: Every day | ORAL | 0 refills | Status: AC

## 2021-11-13 MED ORDER — ACETAMINOPHEN 325 MG PO TABS
650.0000 mg | ORAL_TABLET | Freq: Four times a day (QID) | ORAL | Status: DC | PRN
  Administered 2021-11-13: 650 mg via ORAL
  Filled 2021-11-13: qty 2

## 2021-11-13 NOTE — ED Notes (Signed)
Patient is resting comfortably. In Room from MRI

## 2021-11-13 NOTE — Discharge Summary (Signed)
Physician Discharge Summary   Patient: Damon Alexander MRN: [id number] DOB: 08-19-1985  Admit date:     11/12/2021  Discharge date: 11/13/21  Discharge Physician: Vassie Loll   PCP: [facility], [facility]   Recommendations at discharge:  Repeat basic metabolic panel to follow ultralights renal function. Reassess patient's head laceration healing process and remove stitches if appropriate. Continue assisting patient with alcohol cessation and complete abstinence. Make sure patient follow-up with neurology service as instructed. Continue assisting patient with tobacco cessation. Reassess blood pressure stability and if needed reinitiate treatment with antihypertensive agents.   Discharge Diagnoses: Principal Problem:   Seizure (HCC) Alcohol abuse Hyponatremia Hypokalemia Hypomagnesemia History of tobacco abuse History of left BKA History of hypertension (no longer taking medication).  Hospital Course: As per H&P written by Dr. Sharl Ma on 11/12/2021 Damon Alexander  is a 36 y.o. male, with history of hypertension, alcohol abuse in the past,, osteomyelitis, s/p left BKA with prosthetic leg was brought to the hospital after patient tripped and fell backwards landing on the back of his head.  He had loss of consciousness and witnessed seizure activity per bystanders.  There was no urinary incontinence.  EMS was called, patient was found to have laceration to back of her head with bleeding controlled with direct pressure. On arrival to ED laceration was repaired by the ED provider.  Patient was about to leave AMA when he had another witnessed seizure.  CT head showed no intracranial process but showed right parietal scalp hematoma. Neurology was consulted, patient given Keppra 1500 mg IV x1, EEG and MRI brain with and without contrast ordered.  Assessment and Plan: 1-seizure-like activity -Patient declined EEG -Mentation completely back to baseline and no further seizure  activity appreciated -MRI demonstrating no significant acute intracranial abnormalities or structural disorder. -Case discussed with neurology service who has recommended Keppra 500 mg twice a day with outpatient follow-up. -Patient discharged home in a stable condition.  2-alcohol abuse -No acute withdrawal symptoms appreciated -Cessation counseling provided -Patient was receptive and planning to quit.  3-hypertension -Blood pressure has remained stable -Patient expressed no longer taking any antihypertensive agent -Continue outpatient follow-up -Heart healthy diet recommended.  4-hypomagnesemia -Most likely in the setting of chronic alcohol abuse and decreased oral intake -Electrolytes have been repleted -Daily supplementation provided.  5-hypokalemia -In the setting of alcohol abuse most likely -Electrolytes repleted; daily supplementation provided.  6-hyponatremia -In the setting of chronic alcohol abuse -Fluid resuscitation provided -Discharge sodium level 133. -Alcohol cessation counseling provided -Repeat basic metabolic panel to follow electrolytes trend. -Patient advised to maintain adequate hydration.  Consultants: Neurology Procedures performed: See below for x-ray reports. Disposition: Home Diet recommendation: Heart healthy diet.   DISCHARGE MEDICATION: Allergies as of 11/13/2021       Reactions   Ciprofloxacin Other (See Comments)   Caused a seizure   Ciprofloxacin         Medication List     STOP taking these medications    cephALEXin 500 MG capsule Commonly known as: KEFLEX   doxycycline 100 MG capsule Commonly known as: VIBRAMYCIN   DULoxetine 60 MG capsule Commonly known as: CYMBALTA   HYDROcodone-acetaminophen 5-325 MG tablet Commonly known as: Norco   naproxen 500 MG tablet Commonly known as: NAPROSYN   olmesartan 20 MG tablet Commonly known as: BENICAR       TAKE these medications    folic acid 1 MG tablet Commonly  known as: FOLVITE Take 1 tablet (1 mg total) by mouth daily.  levETIRAcetam 500 MG tablet Commonly known as: KEPPRA Take 1 tablet (500 mg total) by mouth 2 (two) times daily.   magnesium oxide 400 (240 Mg) MG tablet Commonly known as: MAG-OX Take 1 tablet (400 mg total) by mouth 2 (two) times daily.   potassium chloride SA 20 MEQ tablet Commonly known as: KLOR-CON M Take 2 tablets (40 mEq total) by mouth daily for 20 days.   thiamine 100 MG tablet Commonly known as: VITAMIN B1 Take 1 tablet (100 mg total) by mouth daily.        Follow-up Information     [facility], [facility]. Schedule an appointment as soon as possible for a visit in 1 week(s).   Contact information: [place] [address] [phone]         Phillips Odor, MD. Schedule an appointment as soon as possible for a visit in 4 week(s).   Specialty: Neurology Contact information: [address] [place] [address] [phone]                Discharge Exam: Filed Weights   11/12/21 1055  Weight: 71.7 kg   General exam: Alert, awake, oriented x 3 Respiratory system: Clear to auscultation. Respiratory effort normal. Cardiovascular system:RRR. No murmurs, rubs, gallops. Gastrointestinal system: Abdomen is nondistended, soft and nontender. No organomegaly or masses felt. Normal bowel sounds heard. Central nervous system: Alert and oriented. No focal neurological deficits. Extremities: No C/C/E, +pedal pulses Skin: No rashes, lesions or ulcers Psychiatry: Judgement and insight appear normal. Mood & affect appropriate.    Condition at discharge: Stable and improved.  The results of significant diagnostics from this hospitalization (including imaging, microbiology, ancillary and laboratory) are listed below for reference.   Imaging Studies: MR Brain W and Wo Contrast  Result Date: 11/13/2021 CLINICAL DATA:  36 year old male with new onset seizure, fall. Abnormal pons on  brain MRI last year suspected to be subacute infarct at that time. EXAM: MRI HEAD WITHOUT AND WITH CONTRAST TECHNIQUE: Multiplanar, multiecho pulse sequences of the brain and surrounding structures were obtained without and with intravenous contrast. CONTRAST:  98mL GADAVIST GADOBUTROL 1 MMOL/ML IV SOLN COMPARISON:  Head CT 11/12/2021.  Brain MRI 10/26/2020. FINDINGS: Brain: No restricted diffusion to suggest acute infarction. No midline shift, mass effect, evidence of mass lesion, ventriculomegaly, extra-axial collection or acute intracranial hemorrhage. Cervicomedullary junction and pituitary are within normal limits. Questionable subtle encephalomalacia now in the central pons. Background cerebral volume chronically seems decreased over that expected for age. However, other gray and white matter signal appears stable and within normal limits throughout the brain. No cortical encephalomalacia or chronic cerebral blood products identified. On thin coronal imaging the hippocampal formations and other mesial temporal lobe structures appear symmetric and within normal limits. No abnormal enhancement identified. No definite abnormal dural thickening. Vascular: Major intracranial vascular flow voids are stable since last year. The major dural venous sinuses are enhancing and appear to be patent. Skull and upper cervical spine: Stable visible cervical spine. Bone marrow signal is stable and within normal limits. Sinuses/Orbits: Orbits appear stable and negative. Paranasal Visualized paranasal sinuses and mastoids are stable and well aerated. Other: Visible internal auditory structures appear normal. Large and broad-based posterior convexity scalp hematoma, appears mildly progressed from the CT yesterday. See series 12, image 25. IMPRESSION: 1. No acute intracranial abnormality. 2. Suspected chronic cerebral volume loss for age, nonspecific. And questionable subtle encephalomalacia in the central pons where abnormal signal  was demonstrated on the MRI last  year. But otherwise negative MRI appearance of the brain. 3. Large posterior convexity scalp hematoma. Electronically Signed   By: Odessa Fleming M.D.   On: 11/13/2021 08:00   CT Head Wo Contrast  Result Date: 11/12/2021 CLINICAL DATA:  Fall with seizure, nausea; no history of seizures EXAM: CT HEAD WITHOUT CONTRAST TECHNIQUE: Contiguous axial images were obtained from the base of the skull through the vertex without intravenous contrast. RADIATION DOSE REDUCTION: This exam was performed according to the departmental dose-optimization program which includes automated exposure control, adjustment of the mA and/or kV according to patient size and/or use of iterative reconstruction technique. COMPARISON:  10/25/2020 FINDINGS: Brain: No evidence of acute infarct, hemorrhage, mass, mass effect, or midline shift. No hydrocephalus or extra-axial fluid collection. Vascular: No hyperdense vessel. Skull: Negative for fracture or focal lesion. Right parietal scalp hematoma. Sinuses/Orbits: No acute finding. Remote right lamina papyracea fracture. Other: The mastoid air cells are well aerated. IMPRESSION: 1. No acute intracranial process. 2. Right parietal scalp hematoma. Electronically Signed   By: Wiliam Ke M.D.   On: 11/12/2021 11:20    Microbiology: Results for orders placed or performed during the hospital encounter of 02/28/21  Resp Panel by RT-PCR (Flu A&B, Covid) Nasopharyngeal Swab     Status: None   Collection Time: 02/28/21 11:33 PM   Specimen: Nasopharyngeal Swab; Nasopharyngeal(NP) swabs in vial transport medium  Result Value Ref Range Status   SARS Coronavirus 2 by RT PCR NEGATIVE NEGATIVE Final    Comment: (NOTE) SARS-CoV-2 target nucleic acids are NOT DETECTED.  The SARS-CoV-2 RNA is generally detectable in upper respiratory specimens during the acute phase of infection. The lowest concentration of SARS-CoV-2 viral copies this assay can detect is 138 copies/mL. A  negative result does not preclude SARS-Cov-2 infection and should not be used as the sole basis for treatment or other patient management decisions. A negative result may occur with  improper specimen collection/handling, submission of specimen other than nasopharyngeal swab, presence of viral mutation(s) within the areas targeted by this assay, and inadequate number of viral copies(<138 copies/mL). A negative result must be combined with clinical observations, patient history, and epidemiological information. The expected result is Negative.  Fact Sheet for Patients:  BloggerCourse.com  Fact Sheet for Healthcare Providers:  SeriousBroker.it  This test is no t yet approved or cleared by the [place] FDA and  has been authorized for detection and/or diagnosis of SARS-CoV-2 by FDA under an Emergency Use Authorization (EUA). This EUA will remain  in effect (meaning this test can be used) for the duration of the COVID-19 declaration under Section 564(b)(1) of the Act, 21 U.S.C.section 360bbb-3(b)(1), unless the authorization is terminated  or revoked sooner.       Influenza A by PCR NEGATIVE NEGATIVE Final   Influenza B by PCR NEGATIVE NEGATIVE Final    Comment: (NOTE) The Xpert Xpress SARS-CoV-2/FLU/RSV plus assay is intended as an aid in the diagnosis of influenza from Nasopharyngeal swab specimens and should not be used as a sole basis for treatment. Nasal washings and aspirates are unacceptable for Xpert Xpress SARS-CoV-2/FLU/RSV testing.  Fact Sheet for Patients: BloggerCourse.com  Fact Sheet for Healthcare Providers: SeriousBroker.it  This test is not yet approved or cleared by the [place] FDA and has been authorized for detection and/or diagnosis of SARS-CoV-2 by FDA under an Emergency Use Authorization (EUA). This EUA will remain in effect (meaning this test can  be used) for the duration of the COVID-19 declaration under Section 564(b)(1)  of the Act, 21 U.S.C. section 360bbb-3(b)(1), unless the authorization is terminated or revoked.  Performed at [facility], [address], [place], [place] [address]     Labs: CBC: Recent Labs  Lab 11/12/21 1104 11/13/21 0253  WBC 5.7 5.1  NEUTROABS 3.5  --   HGB 12.7* 11.6*  HCT 37.8* 33.8*  MCV 84.4 83.7  PLT 154 131*   Basic Metabolic Panel: Recent Labs  Lab 11/12/21 1104 11/13/21 0253  NA 131* 132*  K 3.4* 3.1*  CL 94* 98  CO2 21* 23  GLUCOSE 118* 135*  BUN 8 7  CREATININE 0.80 0.64  CALCIUM 9.5 9.0  MG 1.6* 2.2   Liver Function Tests: Recent Labs  Lab 11/13/21 0253  AST 128*  ALT 46*  ALKPHOS 73  BILITOT 1.4*  PROT 7.3  ALBUMIN 3.9   CBG: No results for input(s): "GLUCAP" in the last 168 hours.  Discharge time spent: greater than 30 minutes.  Signed: Vassie Loll, MD [facility] 11/13/2021

## 2021-11-13 NOTE — ED Notes (Signed)
Neurotele cart placed in room for neurologist to evaluate pt for recent seizures

## 2021-11-13 NOTE — ED Notes (Signed)
Pt states he is ready to be discharged--MD made aware and at states he will be at bedside discussing plan of care with pt

## 2021-11-13 NOTE — ED Notes (Signed)
Pt reports he would like to leave. He states he will  "wait until morning when the ativan kicks in". Pt is alert and oriented. He does inform this RN that he his last drink of alcohol was on Monday. He reports he drinks a beer or two and never drinks to "get drunk". Damon Alexander

## 2021-11-13 NOTE — TOC Progression Note (Signed)
Transition of Care [facility]) - Progression Note    Patient Details  Name: SONU KRUCKENBERG MRN: [id number] Date of Birth: March 05, 1985  [facility]) CM/SW Contact  Salome Arnt,  Phone Number: 11/13/2021, 12:48 PM  Clinical Narrative:  TOC received consult for substance abuse counseling. Pt agreeable to assessment with pt's sister present and father on phone.  Pt admits to drinking about 2 beers a day. He denies this is a problem for him, but his father states he believes it is. Pt accepting of resources for follow up if desired. Resources added to AVS. Pt states he is ready for d/c.       Barriers to Discharge: Barriers Resolved  Expected Discharge Plan and Services                                                 Social Determinants of Health (SDOH) Interventions    Readmission Risk Interventions     No data to display

## 2021-11-13 NOTE — Consult Note (Signed)
I connected with  Damon Alexander on 11/13/21 by a video enabled telemedicine application and verified that I am speaking with the correct person using two identifiers.   I discussed the limitations of evaluation and management by telemedicine. The patient expressed understanding and agreed to proceed.  Location of [facility]: [facility] Location of physician: [facility]   Neurology Consultation Reason for Consult: seizure Referring Physician: Dr Vivi Barrack  CC: seizure  History is obtained from: patient, chart review   HPI: Damon Alexander is a 36 y.o. 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  Patient reported tripping and falling backwards.  The next he remembers is waking up in the hospital.  per ED notes, bystanders noted loss of consciousness and whole body generalized tonic-clonic shaking with foaming at mouth.  EMS was called and patient was brought to emergency room.  In the emergency room, patient was noted to have laceration to the back of his head which was sutured.  CT head and MRI brain did not show any acute abnormalities but did show right parietal scalp hematoma.  While in the emergency room, patient had another seizure described as becoming stiff, jerking all over, secretions coming from his mouth as well as LEFT gaze deviation, hypoxic to the 80s and tachycardia into the 140s.  2 mg IV Ativan was administered after which seizure resolved.  Patient was admitted for further work-up and started on CIWA protocol as well as as needed benzodiazepines  ROS: All other systems reviewed and negative except as noted in the HPI.   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 He has been smoking an average of .5 packs per day. He has never used smokeless tobacco. He reports current alcohol use of about 2.0 standard drinks of alcohol per week. He reports current drug use. Drug: Marijuana.   Exam: Current vital signs: BP 123/83   Pulse 76   Temp (!) 97.1 F (36.2 C)   Resp 14   Ht 5' 9.5" (1.765 m)   Wt 71.7 kg   SpO2 100%   BMI 23.00 kg/m  Vital signs in last 24 hours: Temp:  [97.1 F (36.2 C)-98.7 F (37.1 C)] 97.1 F (36.2 C) (10/11 0630) Pulse Rate:  [72-114] 76 (10/11 0630) Resp:  [13-22] 14 (10/11 0630) BP: (122-158)/(72-105) 123/83 (10/11 0700) SpO2:  [95 %-100 %] 100 % (10/11 0630) Weight:  [71.7 kg] 71.7 kg (10/10 1055)   Physical Exam  Constitutional: Appears well-developed and well-nourished.  Psych: Affect appropriate to  situation Eyes: No scleral injection Neuro: AOx3, cranial nerves II to XII grossly intact, antigravity strength without drift in upper extremities, FTN intact bilaterally  I have reviewed labs in epic and the results pertinent to this consultation are: CBC:  Recent Labs  Lab 11/12/21 1104 11/13/21 0253  WBC 5.7 5.1  NEUTROABS 3.5  --   HGB 12.7* 11.6*  HCT 37.8* 33.8*  MCV 84.4 83.7  PLT 154 131*    Basic Metabolic Panel:  Lab Results  Component Value Date   NA 132 (L)  11/13/2021   K 3.1 (L) 11/13/2021   CO2 23 11/13/2021   GLUCOSE 135 (H) 11/13/2021   BUN 7 11/13/2021   CREATININE 0.64 11/13/2021   CALCIUM 9.0 11/13/2021   GFRNONAA >60 11/13/2021   GFRAA >60 12/05/2018   Lipid Panel: No results found for: "LDLCALC" HgbA1c:  Lab Results  Component Value Date   HGBA1C 6.2 (H) 08/04/2017   Urine Drug Screen:     Component Value Date/Time   LABOPIA NONE DETECTED 11/12/2021 1104   COCAINSCRNUR NONE DETECTED 11/12/2021 1104   LABBENZ NONE DETECTED 11/12/2021 1104   AMPHETMU NONE DETECTED 11/12/2021 1104   THCU POSITIVE (A) 11/12/2021 1104   LABBARB NONE DETECTED 11/12/2021 1104    Alcohol Level     Component Value Date/Time   ETH <10 11/12/2021 1114     I have reviewed the images obtained: MRI brain w and wo contrast: No acute intracranial abnormality. Suspected chronic cerebral volume loss for age, nonspecific. And questionable subtle encephalomalacia in the central pons where abnormal signal was demonstrated on the MRI last year.But otherwise negative MRI appearance of the brain. Large posterior convexity scalp hematoma.    ASSESSMENT/PLAN: 36 year old male with 2 seizures within 24 hours without any clear provoking factors (does have history of alcohol use but had last drink on Monday with alcohol level less than 10 on arrival)  Focal seizures -Even though these episodes could be attributed to alcohol withdrawal, the focal nature of the episode makes it more likely that seizures will recur without medications  Recommendations -We will obtain routine EEG to assess for epileptogenicity -Agree with Keppra 500 mg twice daily.  This will need to be continued at the time of discharge -Discussed seizure precautions including do not drive -Agree with CIWA protocol -Alcohol cessation counseling Discussed seizure provoking factors including lack of sleep, medication noncompliance, alcohol use -Recommend neurology follow-up in 3  months  Seizure precautions: Per [facility] statutes, patients with seizures are not allowed to drive until they have been seizure-free for six months and cleared by a physician    Use caution when using heavy equipment or power tools. Avoid working on ladders or at heights. Take showers instead of baths. Ensure the water temperature is not too high on the home water heater. Do not go swimming alone. Do not lock yourself in a room alone (i.e. bathroom). When caring for infants or small children, sit down when holding, feeding, or changing them to minimize risk of injury to the child in the event you have a seizure. Maintain good sleep hygiene. Avoid alcohol.    If patient has another seizure, call 911 and bring them back to the ED if: A.  The seizure lasts longer than 5 minutes.      B.  The patient doesn't wake shortly after the seizure or has new problems such as difficulty seeing, speaking or moving following the seizure C.  The patient was injured during the  seizure D.  The patient has a temperature over 102 F (39C) E.  The patient vomited during the seizure and now is having trouble breathing    During the Seizure   - First, ensure adequate ventilation and place patients on the floor on their left side  Loosen clothing around the neck and ensure the airway is patent. If the patient is clenching the teeth, do not force the mouth open with any object as this can cause severe damage - Remove all items from the surrounding that can be hazardous. The patient may be oblivious to what's happening and may not even know what he or she is doing. If the patient is confused and wandering, either gently guide him/her away and block access to outside areas - Reassure the individual and be comforting - Call 911. In most cases, the seizure ends before EMS arrives. However, there are cases when seizures may last over 3 to 5 minutes. Or the individual may have developed breathing difficulties or  severe injuries. If a pregnant patient or a person with diabetes develops a seizure, it is prudent to call an ambulance. - Finally, if the patient does not regain full consciousness, then call EMS. Most patients will remain confused for about 45 to 90 minutes after a seizure, so you must use judgment in calling for help.     After the Seizure (Postictal Stage)   After a seizure, most patients experience confusion, fatigue, muscle pain and/or a headache. Thus, one should permit the individual to sleep. For the next few days, reassurance is essential. Being calm and helping reorient the person is also of importance.   Most seizures are painless and end spontaneously. Seizures are not harmful to others but can lead to complications such as stress on the lungs, brain and the heart. Individuals with prior lung problems may develop labored breathing and respiratory distress.     Thank you for allowing [place] to participate in the care of this patient. If you have any further questions, please contact  me or neurohospitalist.   Lindie Spruce Epilepsy [facility]

## 2021-11-13 NOTE — ED Notes (Signed)
PO meds ordered for pt; However, pt diet order is NPO--[place], MD, states pt can have sips with meds

## 2021-11-14 LAB — HIV ANTIBODY (ROUTINE TESTING W REFLEX): HIV Screen 4th Generation wRfx: NONREACTIVE

## 2021-11-20 ENCOUNTER — Encounter (HOSPITAL_COMMUNITY): Payer: Self-pay

## 2021-11-20 ENCOUNTER — Other Ambulatory Visit: Payer: Self-pay

## 2021-11-20 ENCOUNTER — Emergency Department (HOSPITAL_COMMUNITY)
Admission: EM | Admit: 2021-11-20 | Discharge: 2021-11-20 | Disposition: A | Discharge status: Home / Self Care | Payer: Medicaid Other | Attending: Emergency Medicine | Admitting: Emergency Medicine

## 2021-11-20 DIAGNOSIS — Z4802 Encounter for removal of sutures: Secondary | ICD-10-CM | POA: Insufficient documentation

## 2021-11-20 DIAGNOSIS — S0101XD Laceration without foreign body of scalp, subsequent encounter: Secondary | ICD-10-CM | POA: Insufficient documentation

## 2021-11-20 DIAGNOSIS — W19XXXD Unspecified fall, subsequent encounter: Secondary | ICD-10-CM | POA: Insufficient documentation

## 2021-11-20 NOTE — ED Triage Notes (Signed)
Patient had 2 staples removed by this nurse. No signs of discharge, no bleeding and scabbing noted to posterior head. Denies pain.

## 2021-11-20 NOTE — ED Notes (Signed)
Tammy, PA at bedside to eval patient.

## 2021-11-20 NOTE — Discharge Instructions (Signed)
Gently clean around the area.  The wound appears to be healing well.  Follow-up with your primary care provider or return emergency department if needed.
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
## 2021-12-26 ENCOUNTER — Encounter (HOSPITAL_COMMUNITY): Payer: Self-pay

## 2021-12-26 ENCOUNTER — Emergency Department (HOSPITAL_COMMUNITY): Payer: Self-pay

## 2021-12-26 ENCOUNTER — Other Ambulatory Visit: Payer: Self-pay

## 2021-12-26 ENCOUNTER — Emergency Department (HOSPITAL_COMMUNITY)
Admission: EM | Admit: 2021-12-26 | Discharge: 2021-12-26 | Disposition: A | Discharge status: Home / Self Care | Payer: Self-pay | Attending: Emergency Medicine | Admitting: Emergency Medicine

## 2021-12-26 DIAGNOSIS — S0083XA Contusion of other part of head, initial encounter: Secondary | ICD-10-CM | POA: Insufficient documentation

## 2021-12-26 DIAGNOSIS — Y908 Blood alcohol level of 240 mg/100 ml or more: Secondary | ICD-10-CM | POA: Insufficient documentation

## 2021-12-26 DIAGNOSIS — W108XXA Fall (on) (from) other stairs and steps, initial encounter: Secondary | ICD-10-CM | POA: Insufficient documentation

## 2021-12-26 LAB — COMPREHENSIVE METABOLIC PANEL
ALT: 19 U/L (ref 0–44)
AST: 64 U/L — ABNORMAL HIGH (ref 15–41)
Albumin: 4.4 g/dL (ref 3.5–5.0)
Alkaline Phosphatase: 79 U/L (ref 38–126)
Anion gap: 14 (ref 5–15)
BUN: 7 mg/dL (ref 6–20)
CO2: 23 mmol/L (ref 22–32)
Calcium: 8.5 mg/dL — ABNORMAL LOW (ref 8.9–10.3)
Chloride: 99 mmol/L (ref 98–111)
Creatinine, Ser: 0.87 mg/dL (ref 0.61–1.24)
GFR, Estimated: >60 mL/min (ref >60)
Glucose, Bld: 100 mg/dL — ABNORMAL HIGH (ref 70–99)
Potassium: 3.4 mmol/L — ABNORMAL LOW (ref 3.5–5.1)
Sodium: 136 mmol/L (ref 135–145)
Total Bilirubin: 0.5 mg/dL (ref 0.3–1.2)
Total Protein: 8.5 g/dL — ABNORMAL HIGH (ref 6.5–8.1)

## 2021-12-26 LAB — CBC WITH DIFFERENTIAL/PLATELET
Abs Immature Granulocytes: 0.02 10*3/uL (ref 0.00–0.07)
Basophils Absolute: 0.1 10*3/uL (ref 0.0–0.1)
Basophils Relative: 1 %
Eosinophils Absolute: 0.1 10*3/uL (ref 0.0–0.5)
Eosinophils Relative: 2 %
HCT: 40.5 % (ref 39.0–52.0)
Hemoglobin: 13.7 g/dL (ref 13.0–17.0)
Immature Granulocytes: 0 %
Lymphocytes Relative: 40 %
Lymphs Abs: 2.2 10*3/uL (ref 0.7–4.0)
MCH: 28 pg (ref 26.0–34.0)
MCHC: 33.8 g/dL (ref 30.0–36.0)
MCV: 82.7 fL (ref 80.0–100.0)
Monocytes Absolute: 0.7 10*3/uL (ref 0.1–1.0)
Monocytes Relative: 13 %
Neutro Abs: 2.5 10*3/uL (ref 1.7–7.7)
Neutrophils Relative %: 44 %
Platelets: 169 10*3/uL (ref 150–400)
RBC: 4.9 MIL/uL (ref 4.22–5.81)
RDW: 15.1 % (ref 11.5–15.5)
WBC: 5.5 10*3/uL (ref 4.0–10.5)
nRBC: 0 % (ref 0.0–0.2)

## 2021-12-26 LAB — ETHANOL: Alcohol, Ethyl (B): 490 mg/dL (ref <10)

## 2021-12-26 MED ORDER — LORAZEPAM 2 MG/ML IJ SOLN: 1.0000 mg | Freq: Once | INTRAMUSCULAR | Status: AC

## 2021-12-26 MED ORDER — IOHEXOL 300 MG/ML  SOLN
100.0000 mL | Freq: Once | INTRAMUSCULAR | Status: AC | PRN
  Administered 2021-12-26: 100 mL via INTRAVENOUS

## 2021-12-26 MED ORDER — SODIUM CHLORIDE 0.9 % IV BOLUS
1000.0000 mL | Freq: Once | INTRAVENOUS | Status: AC
  Administered 2021-12-26: 1000 mL via INTRAVENOUS

## 2021-12-26 MED ORDER — LORAZEPAM 2 MG/ML IJ SOLN
INTRAMUSCULAR | Status: AC
  Administered 2021-12-26: 1 mg via INTRAVENOUS
  Filled 2021-12-26: qty 1

## 2021-12-26 MED ORDER — SODIUM CHLORIDE 0.9 % IV BOLUS
500.0000 mL | Freq: Once | INTRAVENOUS | Status: AC
  Administered 2021-12-26: 500 mL via INTRAVENOUS

## 2021-12-26 NOTE — ED Notes (Addendum)
Pt states he fell down 15 steps, hit face. Facial swelling noted, bruising noted to left side of back, left back of arm, left hip area, pt screaming with pain with palpation of right hip. EDP notified.

## 2021-12-26 NOTE — ED Notes (Signed)
Date and time results received: 12/26/21 1018 (use smartphrase ".now" to insert current time)  Test: alcohol Critical Value: 490  Name of Provider Notified: [email or web address]  Orders Received? Or Actions Taken?: Orders Received - See Orders for details

## 2021-12-26 NOTE — ED Provider Notes (Signed)
[facility] EMERGENCY DEPARTMENT Provider Note   CSN: [id number] Arrival date & time: 12/26/21  [id number]     History  Chief Complaint  Patient presents with   Dwan BoltFall    Damon Alexander is a 36 y.o. male.  Patient has history of alcohol abuse and below the knee amputation.  Patient states he fell down some stairs today.  The history is provided by the patient and medical records. No language interpreter was used.  Fall This is a new problem. The current episode started 6 to 12 hours ago. The problem occurs daily. The problem has not changed since onset.Pertinent negatives include no chest pain. Nothing aggravates the symptoms. Nothing relieves the symptoms. He has tried nothing for the symptoms. The treatment provided no relief.       Home Medications Prior to Admission medications   Medication Sig Start Date End Date Taking? Authorizing Provider  folic acid (FOLVITE) 1 MG tablet Take 1 tablet (1 mg total) by mouth daily. 11/13/21   Vassie LollMadera, Carlos, MD  levETIRAcetam (KEPPRA) 500 MG tablet Take 1 tablet (500 mg total) by mouth 2 (two) times daily. 11/13/21   Vassie LollMadera, Carlos, MD  magnesium oxide (MAG-OX) 400 (240 Mg) MG tablet Take 1 tablet (400 mg total) by mouth 2 (two) times daily. 11/13/21   Vassie LollMadera, Carlos, MD  potassium chloride SA (KLOR-CON M) 20 MEQ tablet Take 2 tablets (40 mEq total) by mouth daily for 20 days. 11/13/21 12/03/21  Vassie LollMadera, Carlos, MD  thiamine (VITAMIN B1) 100 MG tablet Take 1 tablet (100 mg total) by mouth daily. 11/13/21   Vassie LollMadera, Carlos, MD      Allergies    Ciprofloxacin and Ciprofloxacin    Review of Systems   Review of Systems  Unable to perform ROS: Mental status change  Cardiovascular:  Negative for chest pain.    Physical Exam Updated Vital Signs BP 113/82   Pulse 82   Temp 97.8 F (36.6 C) (Axillary)   Resp 12   Ht 5' 9.5" (1.765 m)   Wt 70.3 kg   SpO2 96%   BMI 22.56 kg/m  Physical Exam Vitals and nursing note reviewed.   Constitutional:      Appearance: He is well-developed.     Comments: Lethargic  HENT:     Head: Normocephalic.     Comments: Bruising to lower lip with small nonsuturable laceration    Nose: Nose normal.  Eyes:     General: No scleral icterus.    Conjunctiva/sclera: Conjunctivae normal.  Neck:     Thyroid: No thyromegaly.  Cardiovascular:     Rate and Rhythm: Normal rate and regular rhythm.     Heart sounds: No murmur heard.    No friction rub. No gallop.  Pulmonary:     Breath sounds: No stridor. No wheezing or rales.  Chest:     Chest wall: No tenderness.  Abdominal:     General: There is no distension.     Tenderness: There is no abdominal tenderness. There is no rebound.  Musculoskeletal:        General: Normal range of motion.     Cervical back: Neck supple.     Comments: Below the knee amputation to the right  Lymphadenopathy:     Cervical: No cervical adenopathy.  Skin:    Findings: No erythema or rash.  Neurological:     Motor: No abnormal muscle tone.     Coordination: Coordination normal.     Comments: Patient is oriented  to person.  He is lethargic  Psychiatric:     Comments: Patient confused     ED Results / Procedures / Treatments   Labs (all labs ordered are listed, but only abnormal results are displayed) Labs Reviewed  COMPREHENSIVE METABOLIC PANEL - Abnormal; Notable for the following components:      Result Value   Potassium 3.4 (*)    Glucose, Bld 100 (*)    Calcium 8.5 (*)    Total Protein 8.5 (*)    AST 64 (*)    All other components within normal limits  ETHANOL - Abnormal; Notable for the following components:   Alcohol, Ethyl (B) 490 (*)    All other components within normal limits  CBC WITH DIFFERENTIAL/PLATELET    EKG None  Radiology CT ABDOMEN PELVIS W CONTRAST  Result Date: 12/26/2021 CLINICAL DATA:  36 year old male with acute abdominal and pelvic pain following fall. EXAM: CT ABDOMEN AND PELVIS WITH CONTRAST TECHNIQUE:  Multidetector CT imaging of the abdomen and pelvis was performed using the standard protocol following bolus administration of intravenous contrast. RADIATION DOSE REDUCTION: This exam was performed according to the departmental dose-optimization program which includes automated exposure control, adjustment of the mA and/or kV according to patient size and/or use of iterative reconstruction technique. CONTRAST:  OMNIPAQUE IOHEXOL 300 MG/ML  SOLN COMPARISON:  None Available. FINDINGS: Lower chest: No acute abnormality. Hepatobiliary: The liver and gallbladder are unremarkable. There is no evidence of intrahepatic or extrahepatic biliary dilatation. Pancreas: Unremarkable Spleen: Unremarkable Adrenals/Urinary Tract: The kidneys, adrenal glands and bladder are unremarkable except for a 2.2 cm hypodense lesion within the posterior mid LEFT kidney (image 30: Series 3). This LEFT renal lesion measures 25 Hounsfield units are portal venous phase and 17 Hounsfield units on delayed imaging most likely represents a cyst. Stomach/Bowel: There is no evidence of bowel dilatation, inflammatory changes or bowel wall thickening. No interloop fluid is noted. The appendix is unremarkable. Vascular/Lymphatic: No significant vascular findings are present. No enlarged abdominal or pelvic lymph nodes. Reproductive: Prostate is unremarkable. Other: No ascites, focal collection or pneumoperitoneum. Musculoskeletal: No acute or suspicious bony abnormalities are noted. Heterotopic calcification posterior to the proximal LEFT femur has a chronic appearance and likely related to prior soft tissue/muscular injury. IMPRESSION: 1. No evidence of acute abnormality. 2. 2.2 cm hypodense LEFT renal lesion, most likely a benign cyst. Recommend follow-up renal ultrasound for confirmation. Electronically Signed   By: Harmon Pier M.D.   On: 12/26/2021 11:08   CT Cervical Spine Wo Contrast  Result Date: 12/26/2021 CLINICAL DATA:  Status post  fall down stairs. EXAM: CT CERVICAL SPINE WITHOUT CONTRAST TECHNIQUE: Multidetector CT imaging of the cervical spine was performed without intravenous contrast. Multiplanar CT image reconstructions were also generated. RADIATION DOSE REDUCTION: This exam was performed according to the departmental dose-optimization program which includes automated exposure control, adjustment of the mA and/or kV according to patient size and/or use of iterative reconstruction technique. COMPARISON:  10/06/2020 FINDINGS: Alignment: Normal. Skull base and vertebrae: No acute fracture. No primary bone lesion or focal pathologic process. Soft tissues and spinal canal: No prevertebral fluid or swelling. No visible canal hematoma. Disc levels: Disc space narrowing and endplate spurring identified within the cervical spine. Most notable at C4-5 and C5-6. Upper chest: No acute abnormality. Emphysematous changes noted within 6 lung apices. Right upper lobe lung nodule measures 2 mm and is unchanged from 10/06/2020 compatible with a benign nodule. Other: None. IMPRESSION: 1. No evidence for cervical spine  fracture or subluxation. 2. Cervical degenerative disc disease. 3. 2 mm right upper lobe lung nodule is unchanged from 10/06/2020 compatible with a benign nodule. 4.  Emphysema (ICD10-J43.9). Electronically Signed   By: Signa Kell M.D.   On: 12/26/2021 10:58   CT Maxillofacial Wo Contrast  Result Date: 12/26/2021 CLINICAL DATA:  Blunt facial trauma.  Fall down 15 steps. EXAM: CT MAXILLOFACIAL WITHOUT CONTRAST TECHNIQUE: Multidetector CT imaging of the maxillofacial structures was performed. Multiplanar CT image reconstructions were also generated. RADIATION DOSE REDUCTION: This exam was performed according to the departmental dose-optimization program which includes automated exposure control, adjustment of the mA and/or kV according to patient size and/or use of iterative reconstruction technique. COMPARISON:  None Available.  FINDINGS: Osseous: No fracture or mandibular dislocation. No destructive process. Orbits: Negative. No traumatic or inflammatory finding. Sinuses: Clear. Soft tissues: No significant soft tissue swelling or hematoma or fluid collection. Limited intracranial: No significant or unexpected finding. IMPRESSION: No evidence of facial bone fracture. Electronically Signed   By: Larose Hires D.O.   On: 12/26/2021 10:57   CT Head Wo Contrast  Result Date: 12/26/2021 CLINICAL DATA:  Fall down 15 steps.  Facial swelling. EXAM: CT HEAD WITHOUT CONTRAST TECHNIQUE: Contiguous axial images were obtained from the base of the skull through the vertex without intravenous contrast. RADIATION DOSE REDUCTION: This exam was performed according to the departmental dose-optimization program which includes automated exposure control, adjustment of the mA and/or kV according to patient size and/or use of iterative reconstruction technique. COMPARISON:  11/12/2021 FINDINGS: Brain: There is no evidence for acute hemorrhage, hydrocephalus, mass lesion, or abnormal extra-axial fluid collection. No definite CT evidence for acute infarction. Vascular: No hyperdense vessel or unexpected calcification. Skull: No evidence for fracture. No worrisome lytic or sclerotic lesion. Sinuses/Orbits: The visualized paranasal sinuses and mastoid air cells are clear. Visualized portions of the globes and intraorbital fat are unremarkable. Other: None. IMPRESSION: No acute intracranial abnormality. Electronically Signed   By: Kennith Center M.D.   On: 12/26/2021 09:57   DG Pelvis Portable  Result Date: 12/26/2021 CLINICAL DATA:  Fall.  Medial vomiting no secondary to on my phone EXAM: PORTABLE PELVIS 1-2 VIEWS COMPARISON:  None Available. FINDINGS: There is no evidence of pelvic fracture or diastasis. No pelvic bone lesions are seen. IMPRESSION: Negative. Electronically Signed   By: Larose Hires D.O.   On: 12/26/2021 09:52   DG Chest Port 1  View  Result Date: 12/26/2021 CLINICAL DATA:  Fall. EXAM: PORTABLE CHEST 1 VIEW COMPARISON:  One-view chest x-ray 03/01/2021 FINDINGS: The heart size and mediastinal contours are within normal limits. Both lungs are clear. The visualized skeletal structures are unremarkable. IMPRESSION: No active disease. Electronically Signed   By: Marin Roberts M.D.   On: 12/26/2021 09:25    Procedures Procedures    Medications Ordered in ED Medications  sodium chloride 0.9 % bolus 500 mL (500 mLs Intravenous New Bag/Given 12/26/21 1010)  iohexol (OMNIPAQUE) 300 MG/ML solution 100 mL (100 mLs Intravenous Contrast Given 12/26/21 1035)  LORazepam (ATIVAN) injection 1 mg (1 mg Intravenous Given 12/26/21 1011)  sodium chloride 0.9 % bolus 1,000 mL (1,000 mLs Intravenous New Bag/Given 12/26/21 1304)    ED Course/ Medical Decision Making/ A&P Clinical Course as of 12/26/21 1506  Thu Dec 26, 2021  1502 MTF [MK]    Clinical Course User Index [MK] Glendora Score, MD  CRITICAL CARE Performed by: Bethann Berkshire Total critical care time: 40 minutes Critical care time was exclusive  of separately billable procedures and treating other patients. Critical care was necessary to treat or prevent imminent or life-threatening deterioration. Critical care was time spent personally by me on the following activities: development of treatment plan with patient and/or surrogate as well as nursing, discussions with consultants, evaluation of patient's response to treatment, examination of patient, obtaining history from patient or surrogate, ordering and performing treatments and interventions, ordering and review of laboratory studies, ordering and review of radiographic studies, pulse oximetry and re-evaluation of patient's condition. Patient would not stay still to get a CT scan so he was given some Ativan IV.                         Medical Decision Making Amount and/or Complexity of Data Reviewed Labs:  ordered. Radiology: ordered.  Risk Prescription drug management.  This patient presents to the ED for concern of fell down steps, this involves an extensive number of treatment options, and is a complaint that carries with it a high risk of complications and morbidity.  The differential diagnosis includes multiple fractures   Co morbidities that complicate the patient evaluation  EtOH abuse   Additional history obtained:  Additional history obtained from patient External records from outside source obtained and reviewed including hospital record   Lab Tests:  I Ordered, and personally interpreted labs.  The pertinent results include: 8490   Imaging Studies ordered:  I ordered imaging studies including CT head chest abdomen I independently visualized and interpreted imaging which showed no acute disease I agree with the radiologist interpretation   Cardiac Monitoring: / EKG:  The patient was maintained on a cardiac monitor.  I personally viewed and interpreted the cardiac monitored which showed an underlying rhythm of: Normal sinus rhythm   Consultations Obtained:  None  Problem List / ED Course / Critical interventions / Medication management  EtOH and fall I ordered medication including normal saline for dehydration Reevaluation of the patient after these medicines showed that the patient improved I have reviewed the patients home medicines and have made adjustments as needed   Social Determinants of Health:  EtOH abuse   Test / Admission - Considered:  None  Patient with EtOH abuse and a fall.  No significant injuries except for contusion to face.  Patient will be dispositioned by my colleague after he is able to ambulate without falling.          Patient sobered up and was discharged by my colleague        Final Clinical Impression(s) / ED Diagnoses Final diagnoses:  None    Rx / DC Orders ED Discharge Orders     None         Bethann Berkshire, MD 12/28/21 [phone]
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
## 2021-12-26 NOTE — ED Triage Notes (Signed)
Patient admits to drinking beer prior to arrival, states he fell down stairs. Lip swollen, right hip pain, abrasions to left bka.

## 2021-12-26 NOTE — ED Triage Notes (Signed)
"  My leg don't work and I fell" per pt

## 2021-12-26 NOTE — ED Notes (Signed)
Patient continues to fall asleep for seconds and then sit straight up. Assisted to CT x 2, but unable to complete abdomen due to him not staying still. Provider informed.

## 2022-08-19 ENCOUNTER — Ambulatory Visit: Payer: Medicaid Other | Admitting: Orthopedic Surgery

## 2022-08-21 ENCOUNTER — Ambulatory Visit: Payer: MEDICAID | Admitting: Orthopedic Surgery

## 2022-08-21 ENCOUNTER — Encounter: Payer: Self-pay | Admitting: Orthopedic Surgery

## 2022-08-21 DIAGNOSIS — Z89512 Acquired absence of left leg below knee: Secondary | ICD-10-CM

## 2022-08-21 NOTE — Progress Notes (Signed)
Office Visit Note   Patient: Damon Alexander           Date of Birth: [birth year]           MRN: [id number] Visit Date: 08/21/2022              Requested by: Randell Patient [facility] [address] [place],  [place] [address] PCP: Health, [facility]  Chief Complaint  Patient presents with   Left Leg - Follow-up    Hx left BKA      HPI: Patient is a 37 year old gentleman who is status post left transtibial amputation in 2019.  Patient's prosthetic leg since that time has broken.  The socket is broken down and the liners are torn.  Assessment & Plan: Visit Diagnoses:  1. Hx of BKA, left (HCC)     Plan: Prescription provided for a new K3 level prosthesis from [employer]. Patient is an existing left transtibial  amputee.  Patient's current comorbidities are not expected to impact the ability to function with the prescribed prosthesis. Patient verbally communicates a strong desire to use a prosthesis. Patient currently requires mobility aids to ambulate without a prosthesis.  Expects not to use mobility aids with a new prosthesis.  Patient is a K3 level ambulator that spends a lot of time walking around on uneven terrain over obstacles, up and down stairs, and ambulates with a variable cadence.      Follow-Up Instructions: Return if symptoms worsen or fail to improve.   Ortho Exam  Patient is alert, oriented, no adenopathy, well-dressed, normal affect, normal respiratory effort. Examination patient has no ulcers over the residual limb no cellulitis no drainage.  Patient is having pain from end bearing weight in the prosthesis.  The 6 socket is broken and the liners are torn.  Imaging: No results found. No images are attached to the encounter.  Labs: Lab Results  Component Value Date   HGBA1C 6.2 (H) 08/04/2017   ESRSEDRATE 15 06/11/2020   ESRSEDRATE 15 07/30/2017   ESRSEDRATE 3 08/31/2016   CRP 7.8 (H) 07/30/2017   CRP <0.8 08/31/2016   CRP <0.8  02/12/2016   REPTSTATUS 08/04/2017 FINAL 07/30/2017   GRAMSTAIN  07/30/2017    RARE WBC PRESENT, PREDOMINANTLY PMN RARE GRAM POSITIVE COCCI Performed at [facility], [address], [place], [place] [address]    CULT  07/30/2017    NO GROWTH 5 DAYS Performed at [facility], [address] [place], [place] [address]    LABORGA METHICILLIN RESISTANT STAPHYLOCOCCUS AUREUS 07/30/2017     Lab Results  Component Value Date   ALBUMIN 4.4 12/26/2021   ALBUMIN 3.9 11/13/2021   ALBUMIN 3.9 03/01/2021    Lab Results  Component Value Date   MG 2.2 11/13/2021   MG 1.6 (L) 11/12/2021   MG 2.0 08/04/2017   Lab Results  Component Value Date   VD25OH 42.4 10/08/2015    No results found for: "PREALBUMIN"    Latest Ref Rng & Units 12/26/2021    9:02 AM 11/13/2021    2:53 AM 11/12/2021   11:04 AM  CBC EXTENDED  WBC 4.0 - 10.5 K/uL 5.5  5.1  5.7   RBC 4.22 - 5.81 MIL/uL 4.90  4.04  4.48   Hemoglobin 13.0 - 17.0 g/dL 10.2  72.5  36.6   HCT 39.0 - 52.0 % 40.5  33.8  37.8   Platelets 150 - 400 K/uL 169  131  154  NEUT# 1.7 - 7.7 K/uL 2.5   3.5   Lymph# 0.7 - 4.0 K/uL 2.2   1.4      There is no height or weight on file to calculate BMI.  Orders:  No orders of the defined types were placed in this encounter.  No orders of the defined types were placed in this encounter.    Procedures: No procedures performed  Clinical Data: No additional findings.  ROS:  All other systems negative, except as noted in the HPI. Review of Systems  Objective: Vital Signs: There were no vitals taken for this visit.  Specialty Comments:  No specialty comments available.  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)    Left leg swelling 07/30/2017   Abscess of left leg 07/28/2016   Left leg cellulitis 07/26/2016   Hypokalemia 07/26/2016   Acquired subluxation of left ankle 03/06/2016   Displaced pilon fracture of left tibia, subsequent encounter for open fracture type IIIA, IIIB, or IIIC with malunion 03/06/2016   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, type III, with nonunion 02/12/2016   HTN (hypertension) 12/05/2015   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   Open fracture of tibial plafond 12/04/2015   Type III open fracture of left tibial plafond with involvement of fibula 10/12/2015   Alcohol abuse 10/12/2015   Nicotine dependence 10/12/2015   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/06/2016   Alcohol abuse 10/12/2015   Displaced pilon fracture of left tibia, subsequent encounter for open fracture type IIIA, IIIB, or IIIC with malunion 03/06/2016   HTN (hypertension) 12/05/2015   pt denies this   Left leg cellulitis 07/26/2016   Medical history non-contributory    Nicotine dependence 10/12/2015   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   Type III open fracture of left tibial plafond with involvement of fibula 10/12/2015    Family History  Problem Relation Age of Onset   Hypertension Mother     Past Surgical History:  Procedure Laterality Date   AMPUTATION Left 08/02/2017   Procedure: AMPUTATION BELOW KNEE;  Surgeon: Nadara Mustard, MD;  Location: [facility] OR;  Service: Orthopedics;  Laterality: Left;   ANKLE FUSION Left 02/12/2016   Procedure: ARTHRODESIS ANKLE;  Surgeon: Myrene Galas, MD;  Location: [facility] OR;  Service: Orthopedics;  Laterality: Left;   APPLICATION OF WOUND VAC Left 10/05/2015   Procedure: APPLICATION OF WOUND VAC;  Surgeon: Cammy Copa, MD;  Location: [facility] OR;  Service: Orthopedics;  Laterality: Left;   APPLICATION OF WOUND VAC Left 10/09/2015   Procedure: APPLICATION OF WOUND VAC;  Surgeon:  Myrene Galas, MD;  Location: [facility] OR;  Service: Orthopedics;  Laterality: Left;   EXTERNAL FIXATION LEG Left 10/05/2015   Procedure: EXTERNAL FIXATION LEG LEFT ANKLE & LEFT LOWER LEG;  Surgeon: Cammy Copa, MD;  Location: [facility] OR;  Service: Orthopedics;  Laterality: Left;   EXTERNAL FIXATION REMOVAL Left 12/04/2015   Procedure: REMOVAL EXTERNAL FIXATION LEFT LEG;  Surgeon: Myrene Galas, MD;  Location[facility] OR;  Service: Orthopedics;  Laterality: Left;   I & D EXTREMITY Left 10/05/2015  Procedure: IRRIGATION AND DEBRIDEMENT EXTREMITY LEFT ANKLE & LEFT  LOWER LEG,PLACEMENT OF ANTIBIOTIC BEADS;  Surgeon: Cammy Copa, MD;  Location: [facility] OR;  Service: Orthopedics;  Laterality: Left;   I & D EXTREMITY Left 10/09/2015   Procedure: IRRIGATION AND DEBRIDEMENT LEFT ANKLE POSSIBLE EX-FIX ADJUSTMENT;  Surgeon: Myrene Galas, MD;  Location: [facility] OR;  Service: Orthopedics;  Laterality: Left;   I & D EXTREMITY Left 07/27/2016   Procedure: IRRIGATION AND DEBRIDEMENT EXTREMITY;  Surgeon: Sheral Apley, MD;  Location: [facility] OR;  Service: Orthopedics;  Laterality: Left;   LEG SURGERY     "rod in my right leg"   MANDIBLE FRACTURE SURGERY     NO PAST SURGERIES     SKIN SPLIT GRAFT Left 12/04/2015   Procedure: SKIN GRAFT SPLIT THICKNESS LEFT LEG;  Surgeon: Myrene Galas, MD;  Location: [facility] OR;  Service: Orthopedics;  Laterality: Left;   TIBIA IM NAIL INSERTION Right 07/31/2012   Procedure: INTRAMEDULLARY (IM) NAIL TIBIAL;  Surgeon: Nadara Mustard, MD;  Location: [facility] OR;  Service: Orthopedics;  Laterality: Right;  Intramedullary Nail right tib/fib   Social History   Occupational History   Not on file  Tobacco Use   Smoking status: Every Day    Current packs/day: 0.50    Types: Cigarettes   Smokeless tobacco: Never  Vaping Use   Vaping status: Never Used  Substance and Sexual Activity   Alcohol use: Yes    Alcohol/week: 2.0 standard drinks of alcohol    Types: 2 Cans of beer per week   Drug use: Yes     Types: Marijuana   Sexual activity: Not on file

## 2022-09-03 ENCOUNTER — Emergency Department (HOSPITAL_COMMUNITY)
Admission: EM | Admit: 2022-09-03 | Discharge: 2022-09-04 | Discharge status: Against Medical Advice | Payer: MEDICAID | Attending: Emergency Medicine | Admitting: Emergency Medicine

## 2022-09-03 ENCOUNTER — Emergency Department (HOSPITAL_COMMUNITY): Payer: MEDICAID

## 2022-09-03 ENCOUNTER — Other Ambulatory Visit: Payer: Self-pay

## 2022-09-03 DIAGNOSIS — I10 Essential (primary) hypertension: Secondary | ICD-10-CM | POA: Diagnosis not present

## 2022-09-03 DIAGNOSIS — R112 Nausea with vomiting, unspecified: Secondary | ICD-10-CM | POA: Insufficient documentation

## 2022-09-03 DIAGNOSIS — R1084 Generalized abdominal pain: Secondary | ICD-10-CM | POA: Insufficient documentation

## 2022-09-03 DIAGNOSIS — Y908 Blood alcohol level of 240 mg/100 ml or more: Secondary | ICD-10-CM | POA: Insufficient documentation

## 2022-09-03 DIAGNOSIS — Z79899 Other long term (current) drug therapy: Secondary | ICD-10-CM | POA: Diagnosis not present

## 2022-09-03 LAB — COMPREHENSIVE METABOLIC PANEL
ALT: 96 U/L — ABNORMAL HIGH (ref 0–44)
AST: 165 U/L — ABNORMAL HIGH (ref 15–41)
Albumin: 4.7 g/dL (ref 3.5–5.0)
Alkaline Phosphatase: 113 U/L (ref 38–126)
Anion gap: 15 (ref 5–15)
BUN: 7 mg/dL (ref 6–20)
CO2: 22 mmol/L (ref 22–32)
Calcium: 8.9 mg/dL (ref 8.9–10.3)
Chloride: 102 mmol/L (ref 98–111)
Creatinine, Ser: 0.86 mg/dL (ref 0.61–1.24)
GFR, Estimated: >60 mL/min (ref >60)
Glucose, Bld: 168 mg/dL — ABNORMAL HIGH (ref 70–99)
Potassium: 3.7 mmol/L (ref 3.5–5.1)
Sodium: 139 mmol/L (ref 135–145)
Total Bilirubin: 0.6 mg/dL (ref 0.3–1.2)
Total Protein: 8.6 g/dL — ABNORMAL HIGH (ref 6.5–8.1)

## 2022-09-03 LAB — URINALYSIS, ROUTINE W REFLEX MICROSCOPIC
Bacteria, UA: NONE SEEN
Bilirubin Urine: NEGATIVE
Glucose, UA: NEGATIVE mg/dL
Ketones, ur: NEGATIVE mg/dL
Leukocytes,Ua: NEGATIVE
Nitrite: NEGATIVE
Protein, ur: 30 mg/dL — AB
Specific Gravity, Urine: 1.01 (ref 1.005–1.030)
pH: 5 (ref 5.0–8.0)

## 2022-09-03 LAB — RAPID URINE DRUG SCREEN, HOSP PERFORMED
Amphetamines: NOT DETECTED
Barbiturates: NOT DETECTED
Benzodiazepines: NOT DETECTED
Cocaine: NOT DETECTED
Opiates: NOT DETECTED
Tetrahydrocannabinol: NOT DETECTED

## 2022-09-03 LAB — CBC
HCT: 40.7 % (ref 39.0–52.0)
Hemoglobin: 13.4 g/dL (ref 13.0–17.0)
MCH: 26 pg (ref 26.0–34.0)
MCHC: 32.9 g/dL (ref 30.0–36.0)
MCV: 79 fL — ABNORMAL LOW (ref 80.0–100.0)
Platelets: 153 10*3/uL (ref 150–400)
RBC: 5.15 MIL/uL (ref 4.22–5.81)
RDW: 18.1 % — ABNORMAL HIGH (ref 11.5–15.5)
WBC: 6.4 10*3/uL (ref 4.0–10.5)
nRBC: 0 % (ref 0.0–0.2)

## 2022-09-03 LAB — ETHANOL: Alcohol, Ethyl (B): 410 mg/dL (ref <10)

## 2022-09-03 LAB — LIPASE, BLOOD: Lipase: 54 U/L — ABNORMAL HIGH (ref 11–51)

## 2022-09-03 MED ORDER — IOHEXOL 300 MG/ML  SOLN
100.0000 mL | Freq: Once | INTRAMUSCULAR | Status: AC | PRN
  Administered 2022-09-03: 100 mL via INTRAVENOUS

## 2022-09-03 MED ORDER — LACTATED RINGERS IV BOLUS
2000.0000 mL | Freq: Once | INTRAVENOUS | Status: AC
  Administered 2022-09-03: 2000 mL via INTRAVENOUS

## 2022-09-03 MED ORDER — DROPERIDOL 2.5 MG/ML IJ SOLN
1.2500 mg | Freq: Once | INTRAMUSCULAR | Status: AC
  Administered 2022-09-03: 1.25 mg via INTRAVENOUS
  Filled 2022-09-03: qty 2

## 2022-09-03 MED ORDER — METOCLOPRAMIDE HCL 5 MG/ML IJ SOLN
10.0000 mg | Freq: Once | INTRAMUSCULAR | Status: AC
  Administered 2022-09-03: 10 mg via INTRAVENOUS
  Filled 2022-09-03: qty 2

## 2022-09-03 NOTE — ED Provider Notes (Signed)
[place] EMERGENCY DEPARTMENT AT [facility] Provider Note   CSN: [id number] Arrival date & time: 09/03/22  1941     History {Add pertinent medical, surgical, social history, OB history to HPI:1} Chief Complaint  Patient presents with   Emesis    Damon Alexander is a 37 y.o. male.   Emesis Associated symptoms: abdominal pain   Patient presents for abdominal pain, nausea, and vomiting.  Medical history includes alcohol abuse, HTN, osteomyelitis, seizures.  Onset of symptoms was yesterday.  Severity waxes and wanes.  He was able to eat something this evening.  He endorses a pain and tightness throughout his stomach.  Last bowel movement was this morning.  It was nondiarrheal and nonbloody.  Patient states that he has had similar episodes in the past that have been related to drinking.  He was incarcerated for several months and was released 1 month ago.  He has resumed drinking.  He states that his typical alcohol consumption is "a few beers per day".  Last drink was yesterday.     Home Medications Prior to Admission medications   Medication Sig Start Date End Date Taking? Authorizing Provider  folic acid (FOLVITE) 1 MG tablet Take 1 tablet (1 mg total) by mouth daily. 11/13/21   Vassie Loll, MD  levETIRAcetam (KEPPRA) 500 MG tablet Take 1 tablet (500 mg total) by mouth 2 (two) times daily. 11/13/21   Vassie Loll, MD  magnesium oxide (MAG-OX) 400 (240 Mg) MG tablet Take 1 tablet (400 mg total) by mouth 2 (two) times daily. 11/13/21   Vassie Loll, MD  potassium chloride SA (KLOR-CON M) 20 MEQ tablet Take 2 tablets (40 mEq total) by mouth daily for 20 days. 11/13/21 12/03/21  Vassie Loll, MD  thiamine (VITAMIN B1) 100 MG tablet Take 1 tablet (100 mg total) by mouth daily. 11/13/21   Vassie Loll, MD      Allergies    Ciprofloxacin and Ciprofloxacin    Review of Systems   Review of Systems  Gastrointestinal:  Positive for abdominal pain, nausea and  vomiting.  All other systems reviewed and are negative.   Physical Exam Updated Vital Signs BP (!) 145/82   Pulse (!) 108   Temp 99 F (37.2 C) (Oral)   Resp 16   Ht 5\' 10"  (1.778 m)   Wt 98 kg   SpO2 95%   BMI 30.99 kg/m  Physical Exam Vitals and nursing note reviewed.  Constitutional:      General: He is not in acute distress.    Appearance: Normal appearance. He is well-developed. He is not ill-appearing, toxic-appearing or diaphoretic.  HENT:     Head: Normocephalic and atraumatic.     Right Ear: External ear normal.     Left Ear: External ear normal.     Nose: Nose normal.     Mouth/Throat:     Mouth: Mucous membranes are moist.  Eyes:     Extraocular Movements: Extraocular movements intact.     Conjunctiva/sclera: Conjunctivae normal.  Cardiovascular:     Rate and Rhythm: Normal rate and regular rhythm.  Pulmonary:     Effort: Pulmonary effort is normal. No respiratory distress.  Abdominal:     Palpations: Abdomen is soft.     Tenderness: There is abdominal tenderness. There is no guarding or rebound.  Musculoskeletal:        General: No swelling. Normal range of motion.     Cervical back: Normal range of motion and neck supple.  Skin:    General: Skin is warm and dry.     Capillary Refill: Capillary refill takes less than 2 seconds.     Coloration: Skin is not jaundiced or pale.  Neurological:     General: No focal deficit present.     Mental Status: He is alert and oriented to person, place, and time.     Cranial Nerves: No cranial nerve deficit.     Sensory: No sensory deficit.     Motor: No weakness.     Coordination: Coordination normal.  Psychiatric:        Mood and Affect: Mood normal.        Behavior: Behavior normal.        Thought Content: Thought content normal.        Judgment: Judgment normal.     ED Results / Procedures / Treatments   Labs (all labs ordered are listed, but only abnormal results are displayed) Labs Reviewed  LIPASE,  BLOOD  COMPREHENSIVE METABOLIC PANEL  CBC  URINALYSIS, ROUTINE W REFLEX MICROSCOPIC    EKG None  Radiology No results found.  Procedures Procedures  {Document cardiac monitor, telemetry assessment procedure when appropriate:1}  Medications Ordered in ED Medications - No data to display  ED Course/ Medical Decision Making/ A&P   {   Click here for ABCD2, HEART and other calculatorsREFRESH Note before signing :1}                              Medical Decision Making Amount and/or Complexity of Data Reviewed Labs: ordered. Radiology: ordered.  Risk Prescription drug management.   This patient presents to the ED for concern of abdominal pain, nausea, vomiting, this involves an extensive number of treatment options, and is a complaint that carries with it a high risk of complications and morbidity.  The differential diagnosis includes enteritis, colitis, SBO, gastritis, intoxication, withdrawal, pancreatitis, cholecystitis   Co morbidities that complicate the patient evaluation  alcohol abuse, HTN, osteomyelitis, seizures   Additional history obtained:  Additional history obtained from N/A External records from outside source obtained and reviewed including EMR   Lab Tests:  I Ordered, and personally interpreted labs.  The pertinent results include: Elevated transaminases consistent with alcohol abuse; hydration normal lipase; normal hemoglobin, no leukocytosis.  Ethanol level is markedly elevated.   Imaging Studies ordered:  I ordered imaging studies including CT of abdomen and pelvis I independently visualized and interpreted imaging which showed no acute findings.  Chronic findings of hepatic steatosis and indeterminate left renal hypodensity I agree with the radiologist interpretation   Cardiac Monitoring: / EKG:  The patient was maintained on a cardiac monitor.  I personally viewed and interpreted the cardiac monitored which showed an underlying rhythm of:  Sinus rhythm  Problem List / ED Course / Critical interventions / Medication management  Patient presenting for abdominal pain, nausea, vomiting since yesterday.  Vital signs on arrival are notable for tachycardia.  On exam, patient appears uncomfortable.  He does have ongoing nausea and dry heaving.  Abdomen is soft.  Generalized tenderness is present.  IV fluids were ordered for hydration.  Reglan was ordered for nausea.  Patient is currently on monitor released from jail.  He declines narcotic pain medication.  Workup was initiated.***. I ordered medication including ***  for ***  Reevaluation of the patient after these medicines showed that the patient {resolved/improved/worsened:23923::"improved"} I have reviewed the patients home medicines and  have made adjustments as needed   Social Determinants of Health:  ***   Test / Admission - Considered:  ***   {Document critical care time when appropriate:1} {Document review of labs and clinical decision tools ie heart score, Chads2Vasc2 etc:1}  {Document your independent review of radiology images, and any outside records:1} {Document your discussion with family members, caretakers, and with consultants:1} {Document social determinants of health affecting pt's care:1} {Document your decision making why or why not admission, treatments were needed:1} Final Clinical Impression(s) / ED Diagnoses Final diagnoses:  None    Rx / DC Orders ED Discharge Orders     None

## 2022-09-03 NOTE — ED Triage Notes (Signed)
Pt complains of abdominal pain, dizziness and vomiting all day today. Denies diarrhea.

## 2022-09-03 NOTE — ED Notes (Signed)
Pt called out wanting to leave RN made aware IV removed Pt ambulatory independently with family

## 2022-09-22 ENCOUNTER — Other Ambulatory Visit: Payer: Self-pay

## 2022-09-22 ENCOUNTER — Emergency Department (HOSPITAL_COMMUNITY): Payer: MEDICAID

## 2022-09-22 ENCOUNTER — Emergency Department (HOSPITAL_COMMUNITY)
Admission: EM | Admit: 2022-09-22 | Discharge: 2022-09-22 | Disposition: A | Discharge status: Home / Self Care | Payer: MEDICAID | Attending: Emergency Medicine | Admitting: Emergency Medicine

## 2022-09-22 ENCOUNTER — Encounter (HOSPITAL_COMMUNITY): Payer: Self-pay | Admitting: Pharmacy Technician

## 2022-09-22 DIAGNOSIS — I10 Essential (primary) hypertension: Secondary | ICD-10-CM | POA: Insufficient documentation

## 2022-09-22 DIAGNOSIS — L723 Sebaceous cyst: Secondary | ICD-10-CM | POA: Insufficient documentation

## 2022-09-22 DIAGNOSIS — L02811 Cutaneous abscess of head [any part, except face]: Secondary | ICD-10-CM | POA: Insufficient documentation

## 2022-09-22 DIAGNOSIS — L03211 Cellulitis of face: Secondary | ICD-10-CM | POA: Insufficient documentation

## 2022-09-22 DIAGNOSIS — R220 Localized swelling, mass and lump, head: Secondary | ICD-10-CM | POA: Diagnosis present

## 2022-09-22 DIAGNOSIS — L089 Local infection of the skin and subcutaneous tissue, unspecified: Secondary | ICD-10-CM

## 2022-09-22 LAB — CBC WITH DIFFERENTIAL/PLATELET
Abs Immature Granulocytes: 0.01 10*3/uL (ref 0.00–0.07)
Basophils Absolute: 0.1 10*3/uL (ref 0.0–0.1)
Basophils Relative: 2 %
Eosinophils Absolute: 0.1 10*3/uL (ref 0.0–0.5)
Eosinophils Relative: 2 %
HCT: 40.4 % (ref 39.0–52.0)
Hemoglobin: 13.3 g/dL (ref 13.0–17.0)
Immature Granulocytes: 0 %
Lymphocytes Relative: 34 %
Lymphs Abs: 1.7 10*3/uL (ref 0.7–4.0)
MCH: 26.5 pg (ref 26.0–34.0)
MCHC: 32.9 g/dL (ref 30.0–36.0)
MCV: 80.5 fL (ref 80.0–100.0)
Monocytes Absolute: 0.7 10*3/uL (ref 0.1–1.0)
Monocytes Relative: 13 %
Neutro Abs: 2.4 10*3/uL (ref 1.7–7.7)
Neutrophils Relative %: 49 %
Platelets: 147 10*3/uL — ABNORMAL LOW (ref 150–400)
RBC: 5.02 MIL/uL (ref 4.22–5.81)
RDW: 19.7 % — ABNORMAL HIGH (ref 11.5–15.5)
WBC: 5 10*3/uL (ref 4.0–10.5)
nRBC: 0 % (ref 0.0–0.2)

## 2022-09-22 LAB — BASIC METABOLIC PANEL
Anion gap: 15 (ref 5–15)
BUN: 10 mg/dL (ref 6–20)
CO2: 24 mmol/L (ref 22–32)
Calcium: 8.8 mg/dL — ABNORMAL LOW (ref 8.9–10.3)
Chloride: 98 mmol/L (ref 98–111)
Creatinine, Ser: 0.74 mg/dL (ref 0.61–1.24)
GFR, Estimated: >60 mL/min (ref >60)
Glucose, Bld: 88 mg/dL (ref 70–99)
Potassium: 3.6 mmol/L (ref 3.5–5.1)
Sodium: 137 mmol/L (ref 135–145)

## 2022-09-22 MED ORDER — LIDOCAINE-EPINEPHRINE-TETRACAINE (LET) TOPICAL GEL
3.0000 mL | Freq: Once | TOPICAL | Status: AC
  Administered 2022-09-22: 3 mL via TOPICAL
  Filled 2022-09-22: qty 3

## 2022-09-22 MED ORDER — LIDOCAINE-EPINEPHRINE (PF) 2 %-1:200000 IJ SOLN
10.0000 mL | Freq: Once | INTRAMUSCULAR | Status: AC
  Administered 2022-09-22: 10 mL
  Filled 2022-09-22: qty 20

## 2022-09-22 MED ORDER — CLINDAMYCIN HCL 150 MG PO CAPS
450.0000 mg | ORAL_CAPSULE | Freq: Once | ORAL | Status: AC
  Administered 2022-09-22: 450 mg via ORAL
  Filled 2022-09-22: qty 3

## 2022-09-22 MED ORDER — BACITRACIN ZINC 500 UNIT/GM EX OINT: 1.0000 | TOPICAL_OINTMENT | Freq: Two times a day (BID) | CUTANEOUS | 0 refills | Status: AC

## 2022-09-22 MED ORDER — CLINDAMYCIN HCL 150 MG PO CAPS: 450.0000 mg | ORAL_CAPSULE | Freq: Four times a day (QID) | ORAL | 0 refills | Status: AC

## 2022-09-22 MED ORDER — IOHEXOL 300 MG/ML  SOLN
75.0000 mL | Freq: Once | INTRAMUSCULAR | Status: AC | PRN
  Administered 2022-09-22: 75 mL via INTRAVENOUS

## 2022-09-22 NOTE — ED Triage Notes (Signed)
Pt here POV with reports of bump to R side of face for "months". Pt states the last several days increased pain and swelling to area. Denies fevers or chills.

## 2022-09-22 NOTE — Discharge Instructions (Addendum)
You are seen in the emergency room for facial swelling.  You clearly have facial cellulitis.  In addition it appears that you had an infected cyst.  We have removed some of the cyst tissue.  Take the antibiotics that are prescribed.  Return to the ER for wound recheck in 3 days.  Call the plastic surgeon at the number provided for additional discussion on how to manage potential cyst.

## 2022-09-22 NOTE — ED Provider Notes (Signed)
[place] EMERGENCY DEPARTMENT AT [facility] Provider Note   CSN: [id number] Arrival date & time: 09/22/22  0272     History  Chief Complaint  Patient presents with   Facial Swelling    Hriday Markiewicz Mucci is a 37 y.o. male.  HPI      37 year old male comes in with chief complaint of right-sided facial swelling.  Patient indicates that he has had a bump to the right side of the face for the last several weeks.  However over the last 2 or 3 days, he has noted increased swelling to the right side of the face and increased swelling of the bump itself.  He did apply pressure to his lesion and had some drainage.  Patient denies any fevers or chills.  Patient has history of seizure disorder, hypertension.  Home Medications Prior to Admission medications   Medication Sig Start Date End Date Taking? Authorizing Provider  clindamycin (CLEOCIN) 150 MG capsule Take 3 capsules (450 mg total) by mouth every 6 (six) hours for 7 days. 09/22/22 09/29/22 Yes Derwood Kaplan, MD  folic acid (FOLVITE) 1 MG tablet Take 1 tablet (1 mg total) by mouth daily. 11/13/21   Vassie Loll, MD  levETIRAcetam (KEPPRA) 500 MG tablet Take 1 tablet (500 mg total) by mouth 2 (two) times daily. 11/13/21   Vassie Loll, MD  magnesium oxide (MAG-OX) 400 (240 Mg) MG tablet Take 1 tablet (400 mg total) by mouth 2 (two) times daily. 11/13/21   Vassie Loll, MD  potassium chloride SA (KLOR-CON M) 20 MEQ tablet Take 2 tablets (40 mEq total) by mouth daily for 20 days. 11/13/21 12/03/21  Vassie Loll, MD  thiamine (VITAMIN B1) 100 MG tablet Take 1 tablet (100 mg total) by mouth daily. 11/13/21   Vassie Loll, MD      Allergies    Ciprofloxacin and Ciprofloxacin    Review of Systems   Review of Systems  All other systems reviewed and are negative.   Physical Exam Updated Vital Signs BP (!) 136/93   Pulse 90   Temp 97.7 F (36.5 C) (Oral)   Resp 15   SpO2 98%  Physical Exam Vitals and  nursing note reviewed.  Constitutional:      Appearance: He is well-developed.  HENT:     Head: Atraumatic.     Mouth/Throat:     Comments: Patient has swelling to the right side of the face, with about 3 cm area of edema and erythema over the right maxillofacial region with a central crusted ulcer.  Tenderness to palpation.  No drainage. Cardiovascular:     Rate and Rhythm: Normal rate.  Pulmonary:     Effort: Pulmonary effort is normal.  Musculoskeletal:     Cervical back: Neck supple.  Skin:    General: Skin is warm.  Neurological:     Mental Status: He is alert and oriented to person, place, and time.     ED Results / Procedures / Treatments   Labs (all labs ordered are listed, but only abnormal results are displayed) Labs Reviewed  BASIC METABOLIC PANEL - Abnormal; Notable for the following components:      Result Value   Calcium 8.8 (*)    All other components within normal limits  CBC WITH DIFFERENTIAL/PLATELET - Abnormal; Notable for the following components:   RDW 19.7 (*)    Platelets 147 (*)    All other components within normal limits  AEROBIC CULTURE W GRAM STAIN (SUPERFICIAL SPECIMEN)  EKG None  Radiology CT Maxillofacial W Contrast  Result Date: 09/22/2022 CLINICAL DATA:  R facial swelling EXAM: CT MAXILLOFACIAL WITH CONTRAST TECHNIQUE: Multidetector CT imaging of the maxillofacial structures was performed with intravenous contrast. Multiplanar CT image reconstructions were also generated. RADIATION DOSE REDUCTION: This exam was performed according to the departmental dose-optimization program which includes automated exposure control, adjustment of the mA and/or kV according to patient size and/or use of iterative reconstruction technique. CONTRAST:  75mL OMNIPAQUE IOHEXOL 300 MG/ML  SOLN COMPARISON:  CT Face 12/26/21 FINDINGS: Osseous: No fracture or mandibular dislocation. No destructive process. Prior screw and plate fixation of the mandibular symphysis.  Orbits: Negative. No traumatic or inflammatory finding. Chronic fracture of the lamina papyracea on the right. Sinuses: No middle ear or mastoid effusion. Paranasal sinuses are notable for mucosal thickening of the floor of bilateral maxillary sinuses. Orbits are unremarkable. Soft tissues: There is a 9 x 8 mm peripherally enhancing lesion in the subcutaneous soft tissues of the right face. There is soft tissue stranding surrounding this lesion. Findings are worrisome for an abscess with surrounding cellulitis. Limited intracranial: No significant or unexpected finding. IMPRESSION: 9 x 8 mm peripherally enhancing lesion in the subcutaneous soft tissues of the right face with surrounding soft tissue stranding. Findings are worrisome for an abscess with surrounding cellulitis. Electronically Signed   By: Lorenza Cambridge M.D.   On: 09/22/2022 11:13    Procedures .Marland KitchenIncision and Drainage  Date/Time: 09/22/2022 2:56 PM  Performed by: Derwood Kaplan, MD Authorized by: Derwood Kaplan, MD   Consent:    Consent obtained:  Verbal   Consent given by:  Patient   Risks, benefits, and alternatives were discussed: yes     Risks discussed:  Bleeding, incomplete drainage, pain, infection and damage to other organs   Alternatives discussed:  Delayed treatment Universal protocol:    Procedure explained and questions answered to patient or proxy's satisfaction: yes     Immediately prior to procedure, a time out was called: yes     Patient identity confirmed:  Arm band Location:    Type:  Abscess   Size:  3   Location:  Head   Head location:  Face Pre-procedure details:    Skin preparation:  Chlorhexidine with alcohol Sedation:    Sedation type:  None Anesthesia:    Anesthesia method:  Topical application and local infiltration   Topical anesthetic:  LET   Local anesthetic:  Lidocaine 2% WITH epi Procedure type:    Complexity:  Complex Procedure details:    Ultrasound guidance: no     Needle aspiration:  no     Incision types:  Stab incision   Incision depth:  Subcutaneous   Wound management:  Probed and deloculated and irrigated with saline   Drainage:  Serosanguinous   Drainage amount:  Scant   Wound treatment:  Wound left open   Packing materials:  None Post-procedure details:    Procedure completion:  Tolerated well, no immediate complications     Medications Ordered in ED Medications  lidocaine-EPINEPHrine (XYLOCAINE W/EPI) 2 %-1:200000 (PF) injection 10 mL (has no administration in time range)  lidocaine-EPINEPHrine-tetracaine (LET) topical gel (3 mLs Topical Given 09/22/22 1241)  iohexol (OMNIPAQUE) 300 MG/ML solution 75 mL (75 mLs Intravenous Contrast Given 09/22/22 0932)  clindamycin (CLEOCIN) capsule 450 mg (450 mg Oral Given 09/22/22 1412)    ED Course/ Medical Decision Making/ A&P  Medical Decision Making Amount and/or Complexity of Data Reviewed Labs: ordered. Radiology: ordered.  Risk Prescription drug management.  37 year old male comes in with chief complaint of right-sided facial swelling.  Patient indicates that the facial swelling is new over the last 2 days, there has been a bump present on the right side of the face, that has worsened over the last 2 or 3 days.  Given the timing of the condition, differential diagnosis for this patient includes facial cellulitis, facial abscess, sebaceous cyst, infected cyst. Overall low suspicion for tumor.  Patient given the option of starting antibiotics with warm compresses and return to the ER in 3 days for recheck -but patient adamant about getting drainage if possible.  Will get a CT face with contrast and reassess.   2:54 PM Patient's imaging was concern for facial cellulitis and abscess.  There was no clear evidence of any cyst.  We proceeded with I&D. Patient and the partner gave verbal consent to proceed.  They are aware that there could be complications such as nerve injury,  cosmetic issues, inadequate drainage.  Upon initiation of the procedure, I noted that patient had some purulence drainage followed by tissue material, white in color, shiny coming out.  I suspect that this is most likely cyst material.  We removed as much material as possible.  This finding was discussed with the patient and the family member.  Will give him as needed plastic surgery follow-up information.   Patient advised to return to the ER for wound recheck in 3 days.  Clindamycin 450 4 times daily initiated.   Final Clinical Impression(s) / ED Diagnoses Final diagnoses:  Facial cellulitis  Infected sebaceous cyst    Rx / DC Orders ED Discharge Orders          Ordered    clindamycin (CLEOCIN) 150 MG capsule  Every 6 hours        09/22/22 1453              Derwood Kaplan, MD 09/22/22 1458

## 2022-09-22 NOTE — ED Notes (Signed)
Patient Alert and oriented to baseline. Stable and ambulatory to baseline. Patient verbalized understanding of the discharge instructions.  Patient belongings were taken by the patient.   

## 2022-09-22 NOTE — ED Notes (Signed)
Pt returned from CT °

## 2022-11-17 ENCOUNTER — Telehealth: Payer: Self-pay | Admitting: Orthopedic Surgery

## 2022-11-17 NOTE — Telephone Encounter (Signed)
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 He can not work due to the new leg has gave him problems with his knees. Can you fax over the note to DSS to [facility] @ [phone].  And his [phone]

## 2022-11-21 ENCOUNTER — Emergency Department (HOSPITAL_COMMUNITY): Payer: MEDICAID

## 2022-11-21 ENCOUNTER — Other Ambulatory Visit: Payer: Self-pay

## 2022-11-21 ENCOUNTER — Emergency Department (HOSPITAL_COMMUNITY)
Admission: EM | Admit: 2022-11-21 | Discharge: 2022-11-21 | Disposition: A | Discharge status: Home / Self Care | Payer: MEDICAID | Attending: Emergency Medicine | Admitting: Emergency Medicine

## 2022-11-21 ENCOUNTER — Encounter (HOSPITAL_COMMUNITY): Payer: Self-pay

## 2022-11-21 DIAGNOSIS — S71102A Unspecified open wound, left thigh, initial encounter: Secondary | ICD-10-CM | POA: Insufficient documentation

## 2022-11-21 DIAGNOSIS — S79922A Unspecified injury of left thigh, initial encounter: Secondary | ICD-10-CM | POA: Diagnosis present

## 2022-11-21 DIAGNOSIS — Y92410 Unspecified street and highway as the place of occurrence of the external cause: Secondary | ICD-10-CM | POA: Diagnosis not present

## 2022-11-21 DIAGNOSIS — S7012XA Contusion of left thigh, initial encounter: Secondary | ICD-10-CM

## 2022-11-21 DIAGNOSIS — W3400XA Accidental discharge from unspecified firearms or gun, initial encounter: Secondary | ICD-10-CM | POA: Insufficient documentation

## 2022-11-21 DIAGNOSIS — Y9301 Activity, walking, marching and hiking: Secondary | ICD-10-CM | POA: Insufficient documentation

## 2022-11-21 HISTORY — DX: Accidental discharge from unspecified firearms or gun, initial encounter: W34.00XA

## 2022-11-21 MED ORDER — MUPIROCIN 2 % EX OINT: 1.0000 | TOPICAL_OINTMENT | Freq: Two times a day (BID) | CUTANEOUS | 0 refills | Status: AC

## 2022-11-21 MED ORDER — KETOROLAC TROMETHAMINE 60 MG/2ML IM SOLN
60.0000 mg | Freq: Once | INTRAMUSCULAR | Status: AC
  Administered 2022-11-21: 60 mg via INTRAMUSCULAR
  Filled 2022-11-21: qty 2

## 2022-11-21 MED ORDER — BACITRACIN ZINC 500 UNIT/GM EX OINT
TOPICAL_OINTMENT | Freq: Two times a day (BID) | CUTANEOUS | Status: DC
  Filled 2022-11-21: qty 0.9

## 2022-11-21 NOTE — Discharge Instructions (Addendum)
Her x-rays show no in your skin, your bones are normal, you can follow-up with your doctor as needed, Tylenol or ibuprofen for pain  Keep the wounds clean and dry, apply topical mupirocin as needed for open wounds to help prevent infection, keep a dressing over the  Thank you for allowing [place] to treat you in the emergency department today.  After reviewing your examination and potential testing that was done it appears that you are safe to go home.  I would like for you to follow-up with your doctor within the next several days, have them obtain your records and follow-up with them to review all potential tests and results from your visit.  If you should develop severe or worsening symptoms return to the emergency department immediately
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
## 2022-11-22 ENCOUNTER — Emergency Department (HOSPITAL_COMMUNITY): Payer: MEDICAID

## 2022-11-22 ENCOUNTER — Inpatient Hospital Stay (HOSPITAL_COMMUNITY)
Admission: EM | Admit: 2022-11-22 | Discharge: 2022-11-26 | DRG: 605 | Disposition: A | Discharge status: Home / Self Care | Payer: MEDICAID | Attending: Surgery | Admitting: Surgery

## 2022-11-22 DIAGNOSIS — F10129 Alcohol abuse with intoxication, unspecified: Secondary | ICD-10-CM | POA: Diagnosis present

## 2022-11-22 DIAGNOSIS — Z881 Allergy status to other antibiotic agents status: Secondary | ICD-10-CM

## 2022-11-22 DIAGNOSIS — Z23 Encounter for immunization: Secondary | ICD-10-CM

## 2022-11-22 DIAGNOSIS — S71131A Puncture wound without foreign body, right thigh, initial encounter: Principal | ICD-10-CM | POA: Diagnosis present

## 2022-11-22 DIAGNOSIS — Y908 Blood alcohol level of 240 mg/100 ml or more: Secondary | ICD-10-CM | POA: Diagnosis present

## 2022-11-22 DIAGNOSIS — D62 Acute posthemorrhagic anemia: Secondary | ICD-10-CM | POA: Diagnosis not present

## 2022-11-22 DIAGNOSIS — Y92009 Unspecified place in unspecified non-institutional (private) residence as the place of occurrence of the external cause: Secondary | ICD-10-CM

## 2022-11-22 DIAGNOSIS — W3400XA Accidental discharge from unspecified firearms or gun, initial encounter: Principal | ICD-10-CM

## 2022-11-22 DIAGNOSIS — Z89512 Acquired absence of left leg below knee: Secondary | ICD-10-CM

## 2022-11-22 DIAGNOSIS — Y249XXA Unspecified firearm discharge, undetermined intent, initial encounter: Secondary | ICD-10-CM

## 2022-11-22 DIAGNOSIS — S7011XA Contusion of right thigh, initial encounter: Secondary | ICD-10-CM | POA: Diagnosis present

## 2022-11-22 LAB — CBC
HCT: 34.3 % — ABNORMAL LOW (ref 39.0–52.0)
Hemoglobin: 11.1 g/dL — ABNORMAL LOW (ref 13.0–17.0)
MCH: 28.3 pg (ref 26.0–34.0)
MCHC: 32.4 g/dL (ref 30.0–36.0)
MCV: 87.5 fL (ref 80.0–100.0)
Platelets: 230 10*3/uL (ref 150–400)
RBC: 3.92 MIL/uL — ABNORMAL LOW (ref 4.22–5.81)
RDW: 18.3 % — ABNORMAL HIGH (ref 11.5–15.5)
WBC: 8 10*3/uL (ref 4.0–10.5)
nRBC: 0 % (ref 0.0–0.2)

## 2022-11-22 LAB — PROTIME-INR
INR: 1.2 (ref 0.8–1.2)
Prothrombin Time: 15.4 s — ABNORMAL HIGH (ref 11.4–15.2)

## 2022-11-22 LAB — I-STAT CHEM 8, ED
BUN: 7 mg/dL (ref 6–20)
Calcium, Ion: 0.9 mmol/L — ABNORMAL LOW (ref 1.15–1.40)
Chloride: 105 mmol/L (ref 98–111)
Creatinine, Ser: 1.6 mg/dL — ABNORMAL HIGH (ref 0.61–1.24)
Glucose, Bld: 102 mg/dL — ABNORMAL HIGH (ref 70–99)
HCT: 36 % — ABNORMAL LOW (ref 39.0–52.0)
Hemoglobin: 12.2 g/dL — ABNORMAL LOW (ref 13.0–17.0)
Potassium: 3.6 mmol/L (ref 3.5–5.1)
Sodium: 143 mmol/L (ref 135–145)
TCO2: 22 mmol/L (ref 22–32)

## 2022-11-22 LAB — COMPREHENSIVE METABOLIC PANEL
ALT: 117 U/L — ABNORMAL HIGH (ref 0–44)
AST: 127 U/L — ABNORMAL HIGH (ref 15–41)
Albumin: 3.9 g/dL (ref 3.5–5.0)
Alkaline Phosphatase: 68 U/L (ref 38–126)
Anion gap: 12 (ref 5–15)
BUN: 7 mg/dL (ref 6–20)
CO2: 24 mmol/L (ref 22–32)
Calcium: 8.2 mg/dL — ABNORMAL LOW (ref 8.9–10.3)
Chloride: 107 mmol/L (ref 98–111)
Creatinine, Ser: 1 mg/dL (ref 0.61–1.24)
GFR, Estimated: >60 mL/min (ref >60)
Glucose, Bld: 105 mg/dL — ABNORMAL HIGH (ref 70–99)
Potassium: 3.6 mmol/L (ref 3.5–5.1)
Sodium: 143 mmol/L (ref 135–145)
Total Bilirubin: 0.5 mg/dL (ref 0.3–1.2)
Total Protein: 6.9 g/dL (ref 6.5–8.1)

## 2022-11-22 LAB — I-STAT CG4 LACTIC ACID, ED: Lactic Acid, Venous: 2.2 mmol/L (ref 0.5–1.9)

## 2022-11-22 LAB — SAMPLE TO BLOOD BANK

## 2022-11-22 LAB — ETHANOL: Alcohol, Ethyl (B): 449 mg/dL (ref <10)

## 2022-11-22 MED ORDER — FENTANYL CITRATE PF 50 MCG/ML IJ SOSY
100.0000 ug | PREFILLED_SYRINGE | Freq: Once | INTRAMUSCULAR | Status: AC
Start: 2022-11-22 — End: 2022-11-22
  Administered 2022-11-22: 100 ug via INTRAVENOUS

## 2022-11-22 MED ORDER — IOHEXOL 350 MG/ML SOLN
100.0000 mL | Freq: Once | INTRAVENOUS | Status: AC | PRN
  Administered 2022-11-22: 100 mL via INTRAVENOUS
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
## 2022-11-22 NOTE — ED Notes (Addendum)
Transported to CT 
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
## 2022-11-23 ENCOUNTER — Other Ambulatory Visit: Payer: Self-pay

## 2022-11-23 DIAGNOSIS — Y92009 Unspecified place in unspecified non-institutional (private) residence as the place of occurrence of the external cause: Secondary | ICD-10-CM | POA: Diagnosis not present

## 2022-11-23 DIAGNOSIS — S7011XA Contusion of right thigh, initial encounter: Secondary | ICD-10-CM | POA: Diagnosis present

## 2022-11-23 DIAGNOSIS — W3400XA Accidental discharge from unspecified firearms or gun, initial encounter: Principal | ICD-10-CM

## 2022-11-23 DIAGNOSIS — S71131A Puncture wound without foreign body, right thigh, initial encounter: Secondary | ICD-10-CM | POA: Diagnosis present

## 2022-11-23 DIAGNOSIS — F10129 Alcohol abuse with intoxication, unspecified: Secondary | ICD-10-CM | POA: Diagnosis present

## 2022-11-23 DIAGNOSIS — Z881 Allergy status to other antibiotic agents status: Secondary | ICD-10-CM | POA: Diagnosis not present

## 2022-11-23 DIAGNOSIS — D62 Acute posthemorrhagic anemia: Secondary | ICD-10-CM | POA: Diagnosis not present

## 2022-11-23 DIAGNOSIS — Z89512 Acquired absence of left leg below knee: Secondary | ICD-10-CM | POA: Diagnosis not present

## 2022-11-23 DIAGNOSIS — Y908 Blood alcohol level of 240 mg/100 ml or more: Secondary | ICD-10-CM | POA: Diagnosis present

## 2022-11-23 DIAGNOSIS — Z23 Encounter for immunization: Secondary | ICD-10-CM | POA: Diagnosis not present

## 2022-11-23 LAB — BASIC METABOLIC PANEL
Anion gap: 16 — ABNORMAL HIGH (ref 5–15)
BUN: 7 mg/dL (ref 6–20)
CO2: 22 mmol/L (ref 22–32)
Calcium: 8.3 mg/dL — ABNORMAL LOW (ref 8.9–10.3)
Chloride: 107 mmol/L (ref 98–111)
Creatinine, Ser: 0.81 mg/dL (ref 0.61–1.24)
GFR, Estimated: >60 mL/min (ref >60)
Glucose, Bld: 132 mg/dL — ABNORMAL HIGH (ref 70–99)
Potassium: 4 mmol/L (ref 3.5–5.1)
Sodium: 145 mmol/L (ref 135–145)

## 2022-11-23 LAB — CBC
HCT: 30.3 % — ABNORMAL LOW (ref 39.0–52.0)
HCT: 27.1 % — ABNORMAL LOW (ref 39.0–52.0)
Hemoglobin: 9.8 g/dL — ABNORMAL LOW (ref 13.0–17.0)
Hemoglobin: 9 g/dL — ABNORMAL LOW (ref 13.0–17.0)
MCH: 28.3 pg (ref 26.0–34.0)
MCH: 28.8 pg (ref 26.0–34.0)
MCHC: 32.3 g/dL (ref 30.0–36.0)
MCHC: 33.2 g/dL (ref 30.0–36.0)
MCV: 87.6 fL (ref 80.0–100.0)
MCV: 86.9 fL (ref 80.0–100.0)
Platelets: 169 10*3/uL (ref 150–400)
Platelets: 176 10*3/uL (ref 150–400)
RBC: 3.46 MIL/uL — ABNORMAL LOW (ref 4.22–5.81)
RBC: 3.12 MIL/uL — ABNORMAL LOW (ref 4.22–5.81)
RDW: 17.7 % — ABNORMAL HIGH (ref 11.5–15.5)
RDW: 17.6 % — ABNORMAL HIGH (ref 11.5–15.5)
WBC: 4.4 10*3/uL (ref 4.0–10.5)
WBC: 5.8 10*3/uL (ref 4.0–10.5)
nRBC: 0 % (ref 0.0–0.2)
nRBC: 0 % (ref 0.0–0.2)

## 2022-11-23 LAB — URINALYSIS, ROUTINE W REFLEX MICROSCOPIC
Bacteria, UA: NONE SEEN
Bilirubin Urine: NEGATIVE
Glucose, UA: NEGATIVE mg/dL
Ketones, ur: NEGATIVE mg/dL
Leukocytes,Ua: NEGATIVE
Nitrite: NEGATIVE
Protein, ur: NEGATIVE mg/dL
Specific Gravity, Urine: 1.03 (ref 1.005–1.030)
pH: 5 (ref 5.0–8.0)

## 2022-11-23 LAB — PREPARE RBC (CROSSMATCH)

## 2022-11-23 LAB — ABO/RH: ABO/RH(D): A POS

## 2022-11-23 LAB — HIV ANTIBODY (ROUTINE TESTING W REFLEX): HIV Screen 4th Generation wRfx: NONREACTIVE

## 2022-11-23 LAB — HEMOGLOBIN AND HEMATOCRIT, BLOOD
HCT: 28 % — ABNORMAL LOW (ref 39.0–52.0)
Hemoglobin: 9.1 g/dL — ABNORMAL LOW (ref 13.0–17.0)

## 2022-11-23 MED ORDER — FOLIC ACID 1 MG PO TABS
1.0000 mg | ORAL_TABLET | Freq: Every day | ORAL | Status: DC
Start: 2022-11-23 — End: 2022-11-26
  Administered 2022-11-23 – 2022-11-26 (×4): 1 mg via ORAL
  Filled 2022-11-23 (×4): qty 1

## 2022-11-23 MED ORDER — LORAZEPAM 1 MG PO TABS
1.0000 mg | ORAL_TABLET | ORAL | Status: DC | PRN
Start: 2022-11-23 — End: 2022-11-26
  Administered 2022-11-24: 1 mg via ORAL
  Filled 2022-11-23: qty 1

## 2022-11-23 MED ORDER — LORAZEPAM 2 MG/ML IJ SOLN
1.0000 mg | INTRAMUSCULAR | Status: DC | PRN
  Administered 2022-11-23: 2 mg via INTRAVENOUS
  Administered 2022-11-23: 1 mg via INTRAVENOUS
  Filled 2022-11-23 (×2): qty 1

## 2022-11-23 MED ORDER — ONDANSETRON 4 MG PO TBDP
4.0000 mg | ORAL_TABLET | Freq: Four times a day (QID) | ORAL | Status: DC | PRN
  Administered 2022-11-24: 4 mg via ORAL
  Filled 2022-11-23: qty 1

## 2022-11-23 MED ORDER — POLYETHYLENE GLYCOL 3350 17 G PO PACK: 17.0000 g | PACK | Freq: Every day | ORAL | Status: DC | PRN

## 2022-11-23 MED ORDER — OXYCODONE HCL 5 MG PO TABS
10.0000 mg | ORAL_TABLET | ORAL | Status: DC | PRN
  Administered 2022-11-23 – 2022-11-26 (×14): 10 mg via ORAL
  Filled 2022-11-23 (×14): qty 2

## 2022-11-23 MED ORDER — THIAMINE MONONITRATE 100 MG PO TABS
100.0000 mg | ORAL_TABLET | Freq: Every day | ORAL | Status: DC
  Administered 2022-11-23 – 2022-11-26 (×4): 100 mg via ORAL
  Filled 2022-11-23 (×4): qty 1

## 2022-11-23 MED ORDER — METHOCARBAMOL 1000 MG/10ML IJ SOLN: 500.0000 mg | Freq: Three times a day (TID) | INTRAMUSCULAR | Status: DC

## 2022-11-23 MED ORDER — SODIUM CHLORIDE 0.9 % IV BOLUS
1000.0000 mL | Freq: Once | INTRAVENOUS | Status: AC
  Administered 2022-11-23: 1000 mL via INTRAVENOUS

## 2022-11-23 MED ORDER — HYDRALAZINE HCL 20 MG/ML IJ SOLN: 10.0000 mg | INTRAMUSCULAR | Status: DC | PRN

## 2022-11-23 MED ORDER — METHOCARBAMOL 500 MG PO TABS
500.0000 mg | ORAL_TABLET | Freq: Three times a day (TID) | ORAL | Status: DC
  Administered 2022-11-23 – 2022-11-24 (×3): 500 mg via ORAL
  Filled 2022-11-23 (×3): qty 1

## 2022-11-23 MED ORDER — ONDANSETRON HCL 4 MG/2ML IJ SOLN
INTRAMUSCULAR | Status: AC
  Filled 2022-11-23: qty 2

## 2022-11-23 MED ORDER — OXYCODONE HCL 5 MG PO TABS: 5.0000 mg | ORAL_TABLET | ORAL | Status: DC | PRN

## 2022-11-23 MED ORDER — DOCUSATE SODIUM 100 MG PO CAPS
100.0000 mg | ORAL_CAPSULE | Freq: Two times a day (BID) | ORAL | Status: DC
  Administered 2022-11-23 – 2022-11-26 (×6): 100 mg via ORAL
  Filled 2022-11-23 (×7): qty 1

## 2022-11-23 MED ORDER — FENTANYL CITRATE PF 50 MCG/ML IJ SOSY
100.0000 ug | PREFILLED_SYRINGE | Freq: Once | INTRAMUSCULAR | Status: AC
  Administered 2022-11-23: 100 ug via INTRAVENOUS
  Filled 2022-11-23: qty 2

## 2022-11-23 MED ORDER — THIAMINE HCL 100 MG/ML IJ SOLN
100.0000 mg | Freq: Every day | INTRAMUSCULAR | Status: DC
Start: 2022-11-23 — End: 2022-11-26
  Filled 2022-11-23: qty 2

## 2022-11-23 MED ORDER — MORPHINE SULFATE (PF) 4 MG/ML IV SOLN
4.0000 mg | INTRAVENOUS | Status: DC | PRN
  Administered 2022-11-23 – 2022-11-26 (×15): 4 mg via INTRAVENOUS
  Filled 2022-11-23 (×15): qty 1

## 2022-11-23 MED ORDER — TETANUS-DIPHTH-ACELL PERTUSSIS 5-2.5-18.5 LF-MCG/0.5 IM SUSY
0.5000 mL | PREFILLED_SYRINGE | Freq: Once | INTRAMUSCULAR | Status: AC
  Administered 2022-11-23: 0.5 mL via INTRAMUSCULAR
  Filled 2022-11-23: qty 0.5

## 2022-11-23 MED ORDER — ACETAMINOPHEN 500 MG PO TABS
1000.0000 mg | ORAL_TABLET | Freq: Four times a day (QID) | ORAL | Status: DC
  Administered 2022-11-23 – 2022-11-26 (×13): 1000 mg via ORAL
  Filled 2022-11-23 (×13): qty 2

## 2022-11-23 MED ORDER — SODIUM CHLORIDE 0.9% IV SOLUTION: Freq: Once | INTRAVENOUS | Status: AC

## 2022-11-23 MED ORDER — ADULT MULTIVITAMIN W/MINERALS CH
1.0000 | ORAL_TABLET | Freq: Every day | ORAL | Status: DC
  Administered 2022-11-23 – 2022-11-26 (×4): 1 via ORAL
  Filled 2022-11-23 (×4): qty 1

## 2022-11-23 MED ORDER — ONDANSETRON HCL 4 MG/2ML IJ SOLN
4.0000 mg | Freq: Four times a day (QID) | INTRAMUSCULAR | Status: DC | PRN
  Administered 2022-11-23 (×3): 4 mg via INTRAVENOUS
  Filled 2022-11-23 (×2): qty 2

## 2022-11-23 MED ORDER — KCL IN DEXTROSE-NACL 20-5-0.45 MEQ/L-%-% IV SOLN
INTRAVENOUS | Status: AC
  Filled 2022-11-23 (×4): qty 1000
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
## 2022-11-23 NOTE — ED Notes (Signed)
Password is [email or web address]
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
## 2022-11-23 NOTE — ED Notes (Signed)
Dr. Janee Morn paged for now hypotensive (87/54) and tachycardic( 104bpm). Verbal orders for 1L NS provided. Started.
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
## 2022-11-23 NOTE — ED Notes (Signed)
Emesis x 1, prn zofran provided
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
## 2022-11-23 NOTE — ED Notes (Signed)
Pt brother called looking for information on patient, Earl Lites [phone]
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
## 2022-11-24 ENCOUNTER — Encounter (HOSPITAL_COMMUNITY): Payer: Self-pay | Admitting: General Surgery

## 2022-11-24 LAB — BPAM RBC
Blood Product Expiration Date: 202411092359
ISSUE DATE / TIME: 202410200312
Unit Type and Rh: 6200

## 2022-11-24 LAB — CBC
HCT: 23.9 % — ABNORMAL LOW (ref 39.0–52.0)
HCT: 25.5 % — ABNORMAL LOW (ref 39.0–52.0)
Hemoglobin: 7.9 g/dL — ABNORMAL LOW (ref 13.0–17.0)
Hemoglobin: 8.6 g/dL — ABNORMAL LOW (ref 13.0–17.0)
MCH: 28.8 pg (ref 26.0–34.0)
MCH: 29 pg (ref 26.0–34.0)
MCHC: 33.1 g/dL (ref 30.0–36.0)
MCHC: 33.7 g/dL (ref 30.0–36.0)
MCV: 87.2 fL (ref 80.0–100.0)
MCV: 85.9 fL (ref 80.0–100.0)
Platelets: 152 10*3/uL (ref 150–400)
Platelets: 184 10*3/uL (ref 150–400)
RBC: 2.74 MIL/uL — ABNORMAL LOW (ref 4.22–5.81)
RBC: 2.97 MIL/uL — ABNORMAL LOW (ref 4.22–5.81)
RDW: 16.7 % — ABNORMAL HIGH (ref 11.5–15.5)
RDW: 16.2 % — ABNORMAL HIGH (ref 11.5–15.5)
WBC: 5.4 10*3/uL (ref 4.0–10.5)
WBC: 6.3 10*3/uL (ref 4.0–10.5)
nRBC: 0 % (ref 0.0–0.2)
nRBC: 0 % (ref 0.0–0.2)

## 2022-11-24 LAB — TYPE AND SCREEN
ABO/RH(D): A POS
Antibody Screen: NEGATIVE
Unit division: 0

## 2022-11-24 LAB — BASIC METABOLIC PANEL
Anion gap: 10 (ref 5–15)
BUN: 5 mg/dL — ABNORMAL LOW (ref 6–20)
CO2: 26 mmol/L (ref 22–32)
Calcium: 8.1 mg/dL — ABNORMAL LOW (ref 8.9–10.3)
Chloride: 96 mmol/L — ABNORMAL LOW (ref 98–111)
Creatinine, Ser: 0.68 mg/dL (ref 0.61–1.24)
GFR, Estimated: >60 mL/min (ref >60)
Glucose, Bld: 112 mg/dL — ABNORMAL HIGH (ref 70–99)
Potassium: 3.5 mmol/L (ref 3.5–5.1)
Sodium: 132 mmol/L — ABNORMAL LOW (ref 135–145)

## 2022-11-24 LAB — MRSA NEXT GEN BY PCR, NASAL: MRSA by PCR Next Gen: NOT DETECTED

## 2022-11-24 MED ORDER — GABAPENTIN 300 MG PO CAPS
300.0000 mg | ORAL_CAPSULE | Freq: Three times a day (TID) | ORAL | Status: DC
  Administered 2022-11-24 – 2022-11-25 (×6): 300 mg via ORAL
  Filled 2022-11-24 (×6): qty 1

## 2022-11-24 MED ORDER — METHOCARBAMOL 500 MG PO TABS
1000.0000 mg | ORAL_TABLET | Freq: Three times a day (TID) | ORAL | Status: AC
  Administered 2022-11-24 – 2022-11-25 (×4): 1000 mg via ORAL
  Filled 2022-11-24 (×4): qty 2

## 2022-11-24 MED ORDER — LORAZEPAM 1 MG PO TABS
1.0000 mg | ORAL_TABLET | Freq: Four times a day (QID) | ORAL | Status: DC | PRN
  Administered 2022-11-24 – 2022-11-25 (×2): 1 mg via ORAL
  Filled 2022-11-24 (×2): qty 1

## 2022-11-24 MED ORDER — PHENOBARBITAL 32.4 MG PO TABS: 32.4000 mg | ORAL_TABLET | Freq: Three times a day (TID) | ORAL | Status: DC

## 2022-11-24 MED ORDER — PHENOBARBITAL 97.2 MG PO TABS
97.2000 mg | ORAL_TABLET | Freq: Three times a day (TID) | ORAL | Status: AC
  Administered 2022-11-24 – 2022-11-26 (×5): 97.2 mg via ORAL
  Filled 2022-11-24 (×5): qty 3

## 2022-11-24 MED ORDER — PHENOBARBITAL 64.8 MG PO TABS
64.8000 mg | ORAL_TABLET | Freq: Three times a day (TID) | ORAL | Status: DC
  Administered 2022-11-26: 64.8 mg via ORAL
  Filled 2022-11-24: qty 2

## 2022-11-24 NOTE — ED Notes (Signed)
Pt requesting meds for pain. Pt also received breakfast tray.

## 2022-11-24 NOTE — ED Notes (Signed)
Dressing to the right leg changed, wound packed with saline guaze, abd applied, wrapped with kling, pt tolerated well.
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
## 2022-11-24 NOTE — Progress Notes (Signed)
Patient admitted to [facility] from ED. Complaint of pain in right thigh of 6 of 10. Patient belongings included clothing and left at bedside. VS and assessment documented. Patient oriented to unit. Call bell within reach.  Florinda Marker, RN

## 2022-11-24 NOTE — ED Notes (Signed)
Sent message to Dr Janee Morn, lab result for hgb level 7.9.

## 2022-11-24 NOTE — ED Notes (Addendum)
In to change dressing, blood noted to have soaked the dressing, and pad. Notified Dr Janee Morn, waiting for response on type of dressing to replace the dressing that was in place. Clean ABD pads applied and wrapped with kling.

## 2022-11-24 NOTE — ED Notes (Signed)
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 tablet 1 tablet (1 tablet Oral Given 11/24/22 0849)  methocarbamol (ROBAXIN) tablet 1,000 mg (1,000 mg Oral Given 11/24/22 1403)  gabapentin (NEURONTIN) capsule 300 mg (300 mg Oral Given 11/24/22 1350)  LORazepam (ATIVAN) tablet 1 mg (has no administration in time range)  PHENobarbital (LUMINAL) tablet 97.2 mg (has no administration in time range)    Followed by  PHENobarbital (LUMINAL) tablet 64.8 mg (has no administration in time range)    Followed by  PHENobarbital (LUMINAL) tablet 32.4 mg (has no administration in time range)  fentaNYL (SUBLIMAZE) injection 100 mcg (100 mcg Intravenous Given 11/22/22 2244)  iohexol (OMNIPAQUE) 350 MG/ML injection 100 mL (100 mLs Intravenous Contrast Given 11/22/22 2322)  fentaNYL (SUBLIMAZE) injection 100 mcg  (100 mcg Intravenous Given 11/23/22 0013)  sodium chloride 0.9 % bolus 1,000 mL (0 mLs Intravenous Stopped 11/23/22 0207)  0.9 %  sodium chloride infusion (Manually program via Guardrails IV Fluids) (0 mLs Intravenous Stopped 11/23/22 0338)  Tdap (BOOSTRIX) injection 0.5 mL (0.5 mLs Intramuscular Given 11/23/22 1051)    Mobility walks     Focused Assessments Dressing in place with no active bleeding noted. Patient does not have prosthesis with him.    R Recommendations: See Admitting Provider Note  Report given to:   Additional Notes:  Dressing in place. Clean/Dry/ Intact. Patient does not have prosthesis.
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
## 2022-11-24 NOTE — Progress Notes (Signed)
Inpatient Rehab Admissions Coordinator:   Per therapy recommendations, patient was screened for CIR candidacy by Laura Staley, MS, CCC-SLP. At this time, Pt. is not yet at a level to tolerate the intensity of CIR; however,   Pt. may have potential to progress to becoming a potential CIR candidate, so CIR admissions team will follow and monitor for progress and participation with therapies and place consult order if Pt. appears to be an appropriate candidate. Please contact me with any questions.   Laura Staley, MS, CCC-SLP Rehab Admissions Coordinator  [phone] (celll) [phone] (office)  

## 2022-11-25 LAB — CBC
HCT: 23.9 % — ABNORMAL LOW (ref 39.0–52.0)
Hemoglobin: 8.3 g/dL — ABNORMAL LOW (ref 13.0–17.0)
MCH: 30.2 pg (ref 26.0–34.0)
MCHC: 34.7 g/dL (ref 30.0–36.0)
MCV: 86.9 fL (ref 80.0–100.0)
Platelets: 195 10*3/uL (ref 150–400)
RBC: 2.75 MIL/uL — ABNORMAL LOW (ref 4.22–5.81)
RDW: 15.9 % — ABNORMAL HIGH (ref 11.5–15.5)
WBC: 6.2 10*3/uL (ref 4.0–10.5)
nRBC: 0 % (ref 0.0–0.2)

## 2022-11-25 LAB — BASIC METABOLIC PANEL
Anion gap: 15 (ref 5–15)
BUN: <5 mg/dL — ABNORMAL LOW (ref 6–20)
CO2: 26 mmol/L (ref 22–32)
Calcium: 9.1 mg/dL (ref 8.9–10.3)
Chloride: 94 mmol/L — ABNORMAL LOW (ref 98–111)
Creatinine, Ser: 0.79 mg/dL (ref 0.61–1.24)
GFR, Estimated: >60 mL/min (ref >60)
Glucose, Bld: 102 mg/dL — ABNORMAL HIGH (ref 70–99)
Potassium: 3.6 mmol/L (ref 3.5–5.1)
Sodium: 135 mmol/L (ref 135–145)

## 2022-11-25 MED ORDER — ENOXAPARIN SODIUM 30 MG/0.3ML IJ SOSY
30.0000 mg | PREFILLED_SYRINGE | Freq: Two times a day (BID) | INTRAMUSCULAR | Status: DC
  Administered 2022-11-25 – 2022-11-26 (×3): 30 mg via SUBCUTANEOUS
  Filled 2022-11-25 (×4): qty 0.3

## 2022-11-25 MED ORDER — ORAL CARE MOUTH RINSE: 15.0000 mL | OROMUCOSAL | Status: DC | PRN

## 2022-11-25 MED ORDER — METHOCARBAMOL 500 MG PO TABS
1000.0000 mg | ORAL_TABLET | Freq: Three times a day (TID) | ORAL | Status: DC | PRN
  Administered 2022-11-25 – 2022-11-26 (×2): 1000 mg via ORAL
  Filled 2022-11-25 (×2): qty 2
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
## 2022-11-26 ENCOUNTER — Other Ambulatory Visit (HOSPITAL_COMMUNITY): Payer: Self-pay

## 2022-11-26 LAB — CBC
HCT: 23.8 % — ABNORMAL LOW (ref 39.0–52.0)
Hemoglobin: 8 g/dL — ABNORMAL LOW (ref 13.0–17.0)
MCH: 29.2 pg (ref 26.0–34.0)
MCHC: 33.6 g/dL (ref 30.0–36.0)
MCV: 86.9 fL (ref 80.0–100.0)
Platelets: 254 10*3/uL (ref 150–400)
RBC: 2.74 MIL/uL — ABNORMAL LOW (ref 4.22–5.81)
RDW: 15.6 % — ABNORMAL HIGH (ref 11.5–15.5)
WBC: 7 10*3/uL (ref 4.0–10.5)
nRBC: 0 % (ref 0.0–0.2)

## 2022-11-26 MED ORDER — ACETAMINOPHEN 500 MG PO TABS: 1000.0000 mg | ORAL_TABLET | Freq: Four times a day (QID) | ORAL | Status: AC

## 2022-11-26 MED ORDER — MORPHINE SULFATE (PF) 2 MG/ML IV SOLN: 2.0000 mg | INTRAVENOUS | Status: DC | PRN

## 2022-11-26 MED ORDER — GABAPENTIN 400 MG PO CAPS
400.0000 mg | ORAL_CAPSULE | Freq: Three times a day (TID) | ORAL | 0 refills | Status: AC
  Filled 2022-11-26: qty 90, 30d supply, fill #0

## 2022-11-26 MED ORDER — METHOCARBAMOL 500 MG PO TABS
1000.0000 mg | ORAL_TABLET | Freq: Three times a day (TID) | ORAL | Status: DC
  Administered 2022-11-26: 1000 mg via ORAL
  Filled 2022-11-26: qty 2

## 2022-11-26 MED ORDER — GABAPENTIN 400 MG PO CAPS
400.0000 mg | ORAL_CAPSULE | Freq: Three times a day (TID) | ORAL | Status: DC
  Administered 2022-11-26: 400 mg via ORAL
  Filled 2022-11-26: qty 1

## 2022-11-26 MED ORDER — OXYCODONE HCL 10 MG PO TABS
5.0000 mg | ORAL_TABLET | Freq: Four times a day (QID) | ORAL | 0 refills | Status: AC | PRN
  Filled 2022-11-26: qty 30, 7d supply, fill #0

## 2022-11-26 MED ORDER — METHOCARBAMOL 500 MG PO TABS
1000.0000 mg | ORAL_TABLET | Freq: Three times a day (TID) | ORAL | 0 refills | Status: AC
  Filled 2022-11-26: qty 60, 10d supply, fill #0
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
## 2022-11-27 ENCOUNTER — Ambulatory Visit (INDEPENDENT_AMBULATORY_CARE_PROVIDER_SITE_OTHER): Payer: MEDICAID | Admitting: Orthopedic Surgery

## 2022-11-27 ENCOUNTER — Encounter (HOSPITAL_COMMUNITY): Payer: Self-pay

## 2022-11-27 DIAGNOSIS — Z89512 Acquired absence of left leg below knee: Secondary | ICD-10-CM | POA: Diagnosis not present

## 2022-12-02 ENCOUNTER — Encounter: Payer: Self-pay | Admitting: Orthopedic Surgery
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
## 2022-12-04 ENCOUNTER — Emergency Department (HOSPITAL_COMMUNITY)
Admission: EM | Admit: 2022-12-04 | Discharge: 2022-12-04 | Disposition: A | Discharge status: Home / Self Care | Payer: MEDICAID | Attending: Student | Admitting: Student

## 2022-12-04 ENCOUNTER — Encounter (HOSPITAL_COMMUNITY): Payer: Self-pay

## 2022-12-04 ENCOUNTER — Emergency Department (HOSPITAL_COMMUNITY): Payer: MEDICAID

## 2022-12-04 ENCOUNTER — Other Ambulatory Visit: Payer: Self-pay

## 2022-12-04 DIAGNOSIS — I10 Essential (primary) hypertension: Secondary | ICD-10-CM | POA: Diagnosis not present

## 2022-12-04 DIAGNOSIS — Z79899 Other long term (current) drug therapy: Secondary | ICD-10-CM | POA: Diagnosis not present

## 2022-12-04 DIAGNOSIS — F17210 Nicotine dependence, cigarettes, uncomplicated: Secondary | ICD-10-CM | POA: Insufficient documentation

## 2022-12-04 DIAGNOSIS — R1030 Lower abdominal pain, unspecified: Secondary | ICD-10-CM | POA: Diagnosis present

## 2022-12-04 DIAGNOSIS — M7981 Nontraumatic hematoma of soft tissue: Secondary | ICD-10-CM | POA: Diagnosis not present

## 2022-12-04 DIAGNOSIS — T148XXA Other injury of unspecified body region, initial encounter: Secondary | ICD-10-CM

## 2022-12-04 LAB — COMPREHENSIVE METABOLIC PANEL
ALT: 31 U/L (ref 0–44)
AST: 76 U/L — ABNORMAL HIGH (ref 15–41)
Albumin: 3.6 g/dL (ref 3.5–5.0)
Alkaline Phosphatase: 69 U/L (ref 38–126)
Anion gap: 14 (ref 5–15)
BUN: 5 mg/dL — ABNORMAL LOW (ref 6–20)
CO2: 23 mmol/L (ref 22–32)
Calcium: 8.2 mg/dL — ABNORMAL LOW (ref 8.9–10.3)
Chloride: 99 mmol/L (ref 98–111)
Creatinine, Ser: 0.62 mg/dL (ref 0.61–1.24)
GFR, Estimated: >60 mL/min (ref >60)
Glucose, Bld: 83 mg/dL (ref 70–99)
Potassium: 3.5 mmol/L (ref 3.5–5.1)
Sodium: 136 mmol/L (ref 135–145)
Total Bilirubin: 0.5 mg/dL (ref 0.3–1.2)
Total Protein: 7.9 g/dL (ref 6.5–8.1)

## 2022-12-04 LAB — LACTIC ACID, PLASMA
Lactic Acid, Venous: 2.3 mmol/L (ref 0.5–1.9)
Lactic Acid, Venous: 2.4 mmol/L (ref 0.5–1.9)

## 2022-12-04 LAB — CBC WITH DIFFERENTIAL/PLATELET
Abs Immature Granulocytes: 0.08 10*3/uL — ABNORMAL HIGH (ref 0.00–0.07)
Basophils Absolute: 0.2 10*3/uL — ABNORMAL HIGH (ref 0.0–0.1)
Basophils Relative: 2 %
Eosinophils Absolute: 0.1 10*3/uL (ref 0.0–0.5)
Eosinophils Relative: 1 %
HCT: 26.8 % — ABNORMAL LOW (ref 39.0–52.0)
Hemoglobin: 8.4 g/dL — ABNORMAL LOW (ref 13.0–17.0)
Immature Granulocytes: 1 %
Lymphocytes Relative: 30 %
Lymphs Abs: 2.9 10*3/uL (ref 0.7–4.0)
MCH: 28.3 pg (ref 26.0–34.0)
MCHC: 31.3 g/dL (ref 30.0–36.0)
MCV: 90.2 fL (ref 80.0–100.0)
Monocytes Absolute: 0.6 10*3/uL (ref 0.1–1.0)
Monocytes Relative: 6 %
Neutro Abs: 5.8 10*3/uL (ref 1.7–7.7)
Neutrophils Relative %: 60 %
Platelets: 542 10*3/uL — ABNORMAL HIGH (ref 150–400)
RBC: 2.97 MIL/uL — ABNORMAL LOW (ref 4.22–5.81)
RDW: 17.5 % — ABNORMAL HIGH (ref 11.5–15.5)
WBC: 9.7 10*3/uL (ref 4.0–10.5)
nRBC: 0 % (ref 0.0–0.2)

## 2022-12-04 MED ORDER — LACTATED RINGERS IV BOLUS: 1000.0000 mL | Freq: Once | INTRAVENOUS | Status: DC

## 2022-12-04 MED ORDER — OXYCODONE-ACETAMINOPHEN 5-325 MG PO TABS
1.0000 | ORAL_TABLET | Freq: Four times a day (QID) | ORAL | 0 refills | Status: DC | PRN
Start: 2022-12-04 — End: 2023-09-13

## 2022-12-04 MED ORDER — IOHEXOL 300 MG/ML  SOLN
100.0000 mL | Freq: Once | INTRAMUSCULAR | Status: AC | PRN
  Administered 2022-12-04: 100 mL via INTRAVENOUS

## 2022-12-04 MED ORDER — ONDANSETRON HCL 4 MG/2ML IJ SOLN
4.0000 mg | Freq: Once | INTRAMUSCULAR | Status: AC
  Administered 2022-12-04: 4 mg via INTRAVENOUS
  Filled 2022-12-04: qty 2

## 2022-12-04 MED ORDER — MORPHINE SULFATE (PF) 4 MG/ML IV SOLN
4.0000 mg | Freq: Once | INTRAVENOUS | Status: AC
  Administered 2022-12-04: 4 mg via INTRAVENOUS
  Filled 2022-12-04: qty 1

## 2022-12-04 MED ORDER — HYDROMORPHONE HCL 1 MG/ML IJ SOLN
1.0000 mg | Freq: Once | INTRAMUSCULAR | Status: AC
  Administered 2022-12-04: 1 mg via INTRAVENOUS
  Filled 2022-12-04: qty 1

## 2022-12-04 MED ORDER — OXYCODONE HCL 5 MG PO TABS
10.0000 mg | ORAL_TABLET | Freq: Once | ORAL | Status: AC
  Administered 2022-12-04: 10 mg via ORAL
  Filled 2022-12-04: qty 2

## 2022-12-04 MED ORDER — SODIUM CHLORIDE 0.9 % IV SOLN
2.0000 g | Freq: Once | INTRAVENOUS | Status: AC
  Administered 2022-12-04: 2 g via INTRAVENOUS
  Filled 2022-12-04: qty 12.5

## 2022-12-04 MED ORDER — VANCOMYCIN HCL 1500 MG/300ML IV SOLN: 1500.0000 mg | Freq: Two times a day (BID) | INTRAVENOUS | Status: DC

## 2022-12-04 MED ORDER — KETOROLAC TROMETHAMINE 15 MG/ML IJ SOLN
15.0000 mg | Freq: Once | INTRAMUSCULAR | Status: AC
  Administered 2022-12-04: 15 mg via INTRAVENOUS
  Filled 2022-12-04: qty 1

## 2022-12-04 MED ORDER — VANCOMYCIN HCL 1750 MG/350ML IV SOLN
1750.0000 mg | Freq: Once | INTRAVENOUS | Status: AC
  Administered 2022-12-04: 1750 mg via INTRAVENOUS
  Filled 2022-12-04: qty 350

## 2022-12-04 NOTE — Progress Notes (Signed)
Pharmacy Antibiotic Note  Damon Alexander is a 37 y.o. male admitted on 12/04/2022 with  wound infection .  Pharmacy has been consulted for Vancomycin dosing.  Plan: Vancomycin 1750mg  mg IV loading dose then 1500mg  IV Q 12 hrs. Goal AUC 400-550. Expected AUC: 420 SCr used: 0.8 F/u cxs and clinical progress Monitor V/S, labs and levels as indicated   Height: 5\' 10"  (177.8 cm) Weight: 90.7 kg (200 lb) IBW/kg (Calculated) : 73  Temp (24hrs), Avg:99.2 F (37.3 C), Min:99.2 F (37.3 C), Max:99.2 F (37.3 C)  No results for input(s): "WBC", "CREATININE", "LATICACIDVEN", "VANCOTROUGH", "VANCOPEAK", "VANCORANDOM", "GENTTROUGH", "GENTPEAK", "GENTRANDOM", "TOBRATROUGH", "TOBRAPEAK", "TOBRARND", "AMIKACINPEAK", "AMIKACINTROU", "AMIKACIN" in the last 168 hours.  Estimated Creatinine Clearance: 144.6 mL/min (by C-G formula based on SCr of 0.79 mg/dL).    Allergies  Allergen Reactions   Ciprofloxacin Other (See Comments)    Caused a seizure   Ciprofloxacin     Seizures   Ciprofloxacin     Antimicrobials this admission: 10/31 Vancomycin >>  10/31  Cefepime x 1 dose in ED   Microbiology results:  11/24/22 MRSA PCR: neg  Thank you for allowing pharmacy to be a part of this patient's care.  Elder Cyphers, BS Pharm D, BCPS Clinical Pharmacist 12/04/2022 4:24 PM

## 2022-12-04 NOTE — ED Notes (Signed)
Urinal given to patient.

## 2022-12-04 NOTE — ED Notes (Signed)
Patient refusing to let this nurse dress wound at this time, stating he need something else for pain. MD made aware.
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
## 2022-12-04 NOTE — ED Notes (Signed)
Wound cleaned with sterile water gauze and packed wet to dry, wrapped with gauze and ace wrap.

## 2022-12-09 LAB — CULTURE, BLOOD (ROUTINE X 2)
Culture: NO GROWTH
Culture: NO GROWTH

## 2022-12-10 ENCOUNTER — Other Ambulatory Visit: Payer: Self-pay

## 2022-12-10 ENCOUNTER — Encounter (HOSPITAL_COMMUNITY): Payer: Self-pay | Admitting: Emergency Medicine

## 2022-12-10 ENCOUNTER — Emergency Department (HOSPITAL_COMMUNITY)
Admission: EM | Admit: 2022-12-10 | Discharge: 2022-12-11 | Disposition: A | Discharge status: Home / Self Care | Payer: MEDICAID

## 2022-12-10 DIAGNOSIS — F10920 Alcohol use, unspecified with intoxication, uncomplicated: Secondary | ICD-10-CM | POA: Insufficient documentation

## 2022-12-10 DIAGNOSIS — N2889 Other specified disorders of kidney and ureter: Secondary | ICD-10-CM | POA: Insufficient documentation

## 2022-12-10 DIAGNOSIS — Y908 Blood alcohol level of 240 mg/100 ml or more: Secondary | ICD-10-CM | POA: Insufficient documentation

## 2022-12-10 DIAGNOSIS — Z5189 Encounter for other specified aftercare: Secondary | ICD-10-CM

## 2022-12-10 DIAGNOSIS — M79606 Pain in leg, unspecified: Secondary | ICD-10-CM | POA: Diagnosis present

## 2022-12-10 LAB — URINALYSIS, ROUTINE W REFLEX MICROSCOPIC
Bilirubin Urine: NEGATIVE
Glucose, UA: NEGATIVE mg/dL
Hgb urine dipstick: NEGATIVE
Ketones, ur: NEGATIVE mg/dL
Leukocytes,Ua: NEGATIVE
Nitrite: NEGATIVE
Protein, ur: NEGATIVE mg/dL
Specific Gravity, Urine: 1.008 (ref 1.005–1.030)
pH: 5 (ref 5.0–8.0)

## 2022-12-10 LAB — BASIC METABOLIC PANEL
Anion gap: 10 (ref 5–15)
BUN: <5 mg/dL — ABNORMAL LOW (ref 6–20)
CO2: 22 mmol/L (ref 22–32)
Calcium: 7.7 mg/dL — ABNORMAL LOW (ref 8.9–10.3)
Chloride: 109 mmol/L (ref 98–111)
Creatinine, Ser: 0.62 mg/dL (ref 0.61–1.24)
GFR, Estimated: >60 mL/min (ref >60)
Glucose, Bld: 87 mg/dL (ref 70–99)
Potassium: 3.5 mmol/L (ref 3.5–5.1)
Sodium: 141 mmol/L (ref 135–145)

## 2022-12-10 LAB — CBC
HCT: 26.8 % — ABNORMAL LOW (ref 39.0–52.0)
Hemoglobin: 8.7 g/dL — ABNORMAL LOW (ref 13.0–17.0)
MCH: 29.9 pg (ref 26.0–34.0)
MCHC: 32.5 g/dL (ref 30.0–36.0)
MCV: 92.1 fL (ref 80.0–100.0)
Platelets: 262 10*3/uL (ref 150–400)
RBC: 2.91 MIL/uL — ABNORMAL LOW (ref 4.22–5.81)
RDW: 17.2 % — ABNORMAL HIGH (ref 11.5–15.5)
WBC: 6.1 10*3/uL (ref 4.0–10.5)
nRBC: 0 % (ref 0.0–0.2)

## 2022-12-10 LAB — ETHANOL: Alcohol, Ethyl (B): 348 mg/dL (ref <10)

## 2022-12-10 LAB — LACTIC ACID, PLASMA: Lactic Acid, Venous: 1.6 mmol/L (ref 0.5–1.9)

## 2022-12-10 MED ORDER — SODIUM CHLORIDE 0.9 % IV BOLUS
1000.0000 mL | Freq: Once | INTRAVENOUS | Status: AC
  Administered 2022-12-10: 1000 mL via INTRAVENOUS

## 2022-12-10 MED ORDER — OXYCODONE-ACETAMINOPHEN 5-325 MG PO TABS
1.0000 | ORAL_TABLET | Freq: Once | ORAL | Status: AC
  Administered 2022-12-10: 1 via ORAL
  Filled 2022-12-10: qty 1
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
## 2022-12-11 ENCOUNTER — Emergency Department (HOSPITAL_COMMUNITY): Payer: MEDICAID

## 2022-12-11 MED ORDER — IOHEXOL 300 MG/ML  SOLN
100.0000 mL | Freq: Once | INTRAMUSCULAR | Status: AC | PRN
  Administered 2022-12-11: 100 mL via INTRAVENOUS

## 2022-12-11 MED ORDER — ACETAMINOPHEN 500 MG PO TABS
1000.0000 mg | ORAL_TABLET | Freq: Once | ORAL | Status: AC
Start: 2022-12-11 — End: 2022-12-11
  Administered 2022-12-11: 1000 mg via ORAL
  Filled 2022-12-11: qty 2

## 2022-12-11 NOTE — ED Notes (Signed)
Pt back from CT

## 2022-12-11 NOTE — ED Notes (Addendum)
Pt to CT

## 2022-12-11 NOTE — ED Notes (Signed)
Reviewed D/C information with the patient, pt verbalized understanding. Pt escorted to the lobby via wheelchair.

## 2022-12-11 NOTE — ED Notes (Signed)
Pt verbalized his concerned about needing more pain medications initially when offered Tylenol then he agreed - see [facility] for intervention. Pt on the phone with support person and states he will walk home. Pt informed he can wait in the lobby for a ride in which the patient declined.
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
## 2022-12-15 LAB — CULTURE, BLOOD (ROUTINE X 2)
Culture: NO GROWTH
Culture: NO GROWTH

## 2023-01-10 ENCOUNTER — Encounter (HOSPITAL_COMMUNITY): Payer: Self-pay

## 2023-01-10 ENCOUNTER — Emergency Department (HOSPITAL_COMMUNITY)
Admission: EM | Admit: 2023-01-10 | Discharge: 2023-01-10 | Disposition: A | Discharge status: Home / Self Care | Payer: MEDICAID | Attending: Emergency Medicine | Admitting: Emergency Medicine

## 2023-01-10 ENCOUNTER — Other Ambulatory Visit: Payer: Self-pay

## 2023-01-10 ENCOUNTER — Emergency Department (HOSPITAL_COMMUNITY): Payer: MEDICAID

## 2023-01-10 DIAGNOSIS — N2889 Other specified disorders of kidney and ureter: Secondary | ICD-10-CM | POA: Diagnosis not present

## 2023-01-10 DIAGNOSIS — R52 Pain, unspecified: Secondary | ICD-10-CM

## 2023-01-10 DIAGNOSIS — F10120 Alcohol abuse with intoxication, uncomplicated: Secondary | ICD-10-CM | POA: Insufficient documentation

## 2023-01-10 DIAGNOSIS — I10 Essential (primary) hypertension: Secondary | ICD-10-CM | POA: Insufficient documentation

## 2023-01-10 DIAGNOSIS — R1031 Right lower quadrant pain: Secondary | ICD-10-CM | POA: Insufficient documentation

## 2023-01-10 DIAGNOSIS — Y908 Blood alcohol level of 240 mg/100 ml or more: Secondary | ICD-10-CM | POA: Insufficient documentation

## 2023-01-10 DIAGNOSIS — G8921 Chronic pain due to trauma: Secondary | ICD-10-CM | POA: Diagnosis present

## 2023-01-10 DIAGNOSIS — N289 Disorder of kidney and ureter, unspecified: Secondary | ICD-10-CM

## 2023-01-10 DIAGNOSIS — F10920 Alcohol use, unspecified with intoxication, uncomplicated: Secondary | ICD-10-CM

## 2023-01-10 LAB — COMPREHENSIVE METABOLIC PANEL
ALT: 32 U/L (ref 0–44)
AST: 72 U/L — ABNORMAL HIGH (ref 15–41)
Albumin: 4 g/dL (ref 3.5–5.0)
Alkaline Phosphatase: 69 U/L (ref 38–126)
Anion gap: 13 (ref 5–15)
BUN: 5 mg/dL — ABNORMAL LOW (ref 6–20)
CO2: 24 mmol/L (ref 22–32)
Calcium: 8.7 mg/dL — ABNORMAL LOW (ref 8.9–10.3)
Chloride: 104 mmol/L (ref 98–111)
Creatinine, Ser: 0.56 mg/dL — ABNORMAL LOW (ref 0.61–1.24)
GFR, Estimated: >60 mL/min (ref >60)
Glucose, Bld: 79 mg/dL (ref 70–99)
Potassium: 3.7 mmol/L (ref 3.5–5.1)
Sodium: 141 mmol/L (ref 135–145)
Total Bilirubin: 0.3 mg/dL (ref <1.2)
Total Protein: 8.1 g/dL (ref 6.5–8.1)

## 2023-01-10 LAB — CBC WITH DIFFERENTIAL/PLATELET
Abs Immature Granulocytes: 0.01 10*3/uL (ref 0.00–0.07)
Basophils Absolute: 0.1 10*3/uL (ref 0.0–0.1)
Basophils Relative: 2 %
Eosinophils Absolute: 0.2 10*3/uL (ref 0.0–0.5)
Eosinophils Relative: 3 %
HCT: 38.6 % — ABNORMAL LOW (ref 39.0–52.0)
Hemoglobin: 12.3 g/dL — ABNORMAL LOW (ref 13.0–17.0)
Immature Granulocytes: 0 %
Lymphocytes Relative: 50 %
Lymphs Abs: 3 10*3/uL (ref 0.7–4.0)
MCH: 26.8 pg (ref 26.0–34.0)
MCHC: 31.9 g/dL (ref 30.0–36.0)
MCV: 84.1 fL (ref 80.0–100.0)
Monocytes Absolute: 0.5 10*3/uL (ref 0.1–1.0)
Monocytes Relative: 9 %
Neutro Abs: 2.1 10*3/uL (ref 1.7–7.7)
Neutrophils Relative %: 36 %
Platelets: 271 10*3/uL (ref 150–400)
RBC: 4.59 MIL/uL (ref 4.22–5.81)
RDW: 15.5 % (ref 11.5–15.5)
WBC: 5.9 10*3/uL (ref 4.0–10.5)
nRBC: 0 % (ref 0.0–0.2)

## 2023-01-10 LAB — ETHANOL: Alcohol, Ethyl (B): 295 mg/dL — ABNORMAL HIGH (ref <10)

## 2023-01-10 MED ORDER — HYDROMORPHONE HCL 1 MG/ML IJ SOLN
1.0000 mg | Freq: Once | INTRAMUSCULAR | Status: AC
  Administered 2023-01-10: 1 mg via INTRAVENOUS
  Filled 2023-01-10: qty 1

## 2023-01-10 MED ORDER — ONDANSETRON HCL 4 MG/2ML IJ SOLN
4.0000 mg | Freq: Once | INTRAMUSCULAR | Status: AC
  Administered 2023-01-10: 4 mg via INTRAVENOUS
  Filled 2023-01-10: qty 2

## 2023-01-10 MED ORDER — IOHEXOL 300 MG/ML  SOLN
100.0000 mL | Freq: Once | INTRAMUSCULAR | Status: AC | PRN
  Administered 2023-01-10: 100 mL via INTRAVENOUS

## 2023-01-10 MED ORDER — HYDROCODONE-ACETAMINOPHEN 5-325 MG PO TABS: 1.0000 | ORAL_TABLET | Freq: Four times a day (QID) | ORAL | 0 refills | Status: AC | PRN

## 2023-01-10 MED ORDER — BACITRACIN ZINC 500 UNIT/GM EX OINT
TOPICAL_OINTMENT | CUTANEOUS | Status: AC
  Administered 2023-01-10: 1
  Filled 2023-01-10: qty 0.9
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
## 2023-01-10 NOTE — ED Notes (Signed)
Family requested an update Pt complains of 10/10 pain

## 2023-01-10 NOTE — ED Notes (Signed)
Pt returned from CT °

## 2023-01-10 NOTE — Discharge Instructions (Addendum)
Make an appointment to follow-up with general surgery here locally give them a call to help with the wound care.  Workup here today without any acute findings.  Also make an appointment to follow-up with urology.  CT scan shows an incidental finding of a left kidney lesion that needs additional workup.  Recommend wound care with antibiotic ointment and then follow-up with general surgery.  Short course of pain medicine provided.

## 2023-01-12 MED FILL — Hydrocodone-Acetaminophen Tab 5-325 MG: ORAL | Qty: 6 | Status: AC

## 2023-02-09 ENCOUNTER — Telehealth: Payer: Self-pay | Admitting: Orthopedic Surgery

## 2023-02-09 NOTE — Telephone Encounter (Signed)
 Patient's dad called. Would like a referral for in home help for him. Dad would like the note mailed to him. His cb# [phone]

## 2023-02-09 NOTE — Telephone Encounter (Signed)
 We have not seen the pt in the office since October. Can you please call and make an appt for pt to see Dr. Lajoyce Corners or Denny Peon next available please?

## 2023-02-24 ENCOUNTER — Encounter: Payer: Self-pay | Admitting: Orthopedic Surgery

## 2023-02-24 ENCOUNTER — Ambulatory Visit (INDEPENDENT_AMBULATORY_CARE_PROVIDER_SITE_OTHER): Payer: MEDICAID | Admitting: Orthopedic Surgery

## 2023-02-24 DIAGNOSIS — Z89512 Acquired absence of left leg below knee: Secondary | ICD-10-CM
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
## 2023-02-25 ENCOUNTER — Ambulatory Visit: Payer: MEDICAID | Admitting: Urology

## 2023-05-06 ENCOUNTER — Emergency Department (HOSPITAL_COMMUNITY)
Admission: EM | Admit: 2023-05-06 | Discharge: 2023-05-06 | Discharge status: Against Medical Advice | Payer: MEDICAID | Attending: Emergency Medicine | Admitting: Emergency Medicine

## 2023-05-06 DIAGNOSIS — Z5321 Procedure and treatment not carried out due to patient leaving prior to being seen by health care provider: Secondary | ICD-10-CM | POA: Diagnosis not present

## 2023-05-06 DIAGNOSIS — R109 Unspecified abdominal pain: Secondary | ICD-10-CM | POA: Diagnosis present

## 2023-05-06 NOTE — ED Notes (Signed)
 Upon beginning triage pt is on phone with spouse, he states he does not wish to be seen and just wants to go home and take a nap

## 2023-05-06 NOTE — ED Triage Notes (Signed)
 Pt BIB [facility] for abd pain, hx of pancreatitis, ETOH +, drinks daily, vss

## 2023-05-27 ENCOUNTER — Ambulatory Visit (HOSPITAL_COMMUNITY)
Admission: RE | Admit: 2023-05-27 | Discharge: 2023-05-27 | Disposition: A | Discharge status: Home / Self Care | Payer: MEDICAID | Source: Ambulatory Visit | Attending: Gerontology | Admitting: Gerontology

## 2023-05-27 ENCOUNTER — Other Ambulatory Visit (HOSPITAL_COMMUNITY): Payer: Self-pay | Admitting: Gerontology

## 2023-05-27 ENCOUNTER — Other Ambulatory Visit (HOSPITAL_COMMUNITY)
Admission: RE | Admit: 2023-05-27 | Discharge: 2023-05-27 | Disposition: A | Discharge status: Home / Self Care | Payer: MEDICAID | Source: Ambulatory Visit | Attending: Internal Medicine | Admitting: Internal Medicine

## 2023-05-27 DIAGNOSIS — R059 Cough, unspecified: Secondary | ICD-10-CM | POA: Insufficient documentation

## 2023-05-27 LAB — CBC WITH DIFFERENTIAL/PLATELET
Abs Immature Granulocytes: 0.02 10*3/uL (ref 0.00–0.07)
Basophils Absolute: 0.2 10*3/uL — ABNORMAL HIGH (ref 0.0–0.1)
Basophils Relative: 3 %
Eosinophils Absolute: 0.1 10*3/uL (ref 0.0–0.5)
Eosinophils Relative: 2 %
HCT: 41.2 % (ref 39.0–52.0)
Hemoglobin: 13.8 g/dL (ref 13.0–17.0)
Immature Granulocytes: 0 %
Lymphocytes Relative: 43 %
Lymphs Abs: 2.4 10*3/uL (ref 0.7–4.0)
MCH: 28.8 pg (ref 26.0–34.0)
MCHC: 33.5 g/dL (ref 30.0–36.0)
MCV: 85.8 fL (ref 80.0–100.0)
Monocytes Absolute: 0.5 10*3/uL (ref 0.1–1.0)
Monocytes Relative: 10 %
Neutro Abs: 2.4 10*3/uL (ref 1.7–7.7)
Neutrophils Relative %: 42 %
Platelets: 220 10*3/uL (ref 150–400)
RBC: 4.8 MIL/uL (ref 4.22–5.81)
RDW: 17.5 % — ABNORMAL HIGH (ref 11.5–15.5)
WBC: 5.6 10*3/uL (ref 4.0–10.5)
nRBC: 0 % (ref 0.0–0.2)

## 2023-05-27 LAB — LIPID PANEL
Cholesterol: 240 mg/dL — ABNORMAL HIGH (ref 0–200)
HDL: 108 mg/dL (ref >40)
LDL Cholesterol: 116 mg/dL — ABNORMAL HIGH (ref 0–99)
Total CHOL/HDL Ratio: 2.2 ratio
Triglycerides: 78 mg/dL (ref <150)
VLDL: 16 mg/dL (ref 0–40)

## 2023-05-27 LAB — BASIC METABOLIC PANEL WITH GFR
Anion gap: 14 (ref 5–15)
BUN: 11 mg/dL (ref 6–20)
CO2: 22 mmol/L (ref 22–32)
Calcium: 9.1 mg/dL (ref 8.9–10.3)
Chloride: 100 mmol/L (ref 98–111)
Creatinine, Ser: 0.7 mg/dL (ref 0.61–1.24)
GFR, Estimated: >60 mL/min (ref >60)
Glucose, Bld: 86 mg/dL (ref 70–99)
Potassium: 4.1 mmol/L (ref 3.5–5.1)
Sodium: 136 mmol/L (ref 135–145)

## 2023-05-27 LAB — HEPATIC FUNCTION PANEL
ALT: 54 U/L — ABNORMAL HIGH (ref 0–44)
AST: 131 U/L — ABNORMAL HIGH (ref 15–41)
Albumin: 4.4 g/dL (ref 3.5–5.0)
Alkaline Phosphatase: 73 U/L (ref 38–126)
Bilirubin, Direct: 0.1 mg/dL (ref 0.0–0.2)
Indirect Bilirubin: 0.5 mg/dL (ref 0.3–0.9)
Total Bilirubin: 0.6 mg/dL (ref 0.0–1.2)
Total Protein: 8.5 g/dL — ABNORMAL HIGH (ref 6.5–8.1)

## 2023-05-27 LAB — HEPATITIS B SURFACE ANTIGEN: Hepatitis B Surface Ag: NONREACTIVE

## 2023-05-27 LAB — HEMOGLOBIN A1C
Hgb A1c MFr Bld: 5.1 % (ref 4.8–5.6)
Mean Plasma Glucose: 99.67 mg/dL

## 2023-05-29 LAB — HCV RNA QUANT RFLX ULTRA OR GENOTYP
HCV RNA Qnt(log copy/mL): UNDETERMINED {Log_IU}/mL
HepC Qn: NOT DETECTED [IU]/mL

## 2023-09-12 ENCOUNTER — Encounter (HOSPITAL_COMMUNITY): Payer: Self-pay

## 2023-09-12 ENCOUNTER — Emergency Department (HOSPITAL_COMMUNITY)
Admission: EM | Admit: 2023-09-12 | Discharge: 2023-09-13 | Disposition: A | Discharge status: Home / Self Care | Payer: MEDICAID | Attending: Emergency Medicine | Admitting: Emergency Medicine

## 2023-09-12 ENCOUNTER — Emergency Department (HOSPITAL_COMMUNITY): Payer: MEDICAID

## 2023-09-12 DIAGNOSIS — R7401 Elevation of levels of liver transaminase levels: Secondary | ICD-10-CM | POA: Diagnosis not present

## 2023-09-12 DIAGNOSIS — M25561 Pain in right knee: Secondary | ICD-10-CM | POA: Diagnosis present

## 2023-09-12 LAB — CBC WITH DIFFERENTIAL/PLATELET
Abs Immature Granulocytes: 0.02 K/uL (ref 0.00–0.07)
Basophils Absolute: 0.2 K/uL — ABNORMAL HIGH (ref 0.0–0.1)
Basophils Relative: 2 %
Eosinophils Absolute: 0.1 K/uL (ref 0.0–0.5)
Eosinophils Relative: 1 %
HCT: 42.6 % (ref 39.0–52.0)
Hemoglobin: 14.5 g/dL (ref 13.0–17.0)
Immature Granulocytes: 0 %
Lymphocytes Relative: 40 %
Lymphs Abs: 3.4 K/uL (ref 0.7–4.0)
MCH: 29.7 pg (ref 26.0–34.0)
MCHC: 34 g/dL (ref 30.0–36.0)
MCV: 87.3 fL (ref 80.0–100.0)
Monocytes Absolute: 0.8 K/uL (ref 0.1–1.0)
Monocytes Relative: 10 %
Neutro Abs: 4 K/uL (ref 1.7–7.7)
Neutrophils Relative %: 47 %
Platelets: 216 K/uL (ref 150–400)
RBC: 4.88 MIL/uL (ref 4.22–5.81)
RDW: 15.5 % (ref 11.5–15.5)
WBC: 8.5 K/uL (ref 4.0–10.5)
nRBC: 0 % (ref 0.0–0.2)

## 2023-09-12 LAB — COMPREHENSIVE METABOLIC PANEL WITH GFR
ALT: 85 U/L — ABNORMAL HIGH (ref 0–44)
AST: 227 U/L — ABNORMAL HIGH (ref 15–41)
Albumin: 4.8 g/dL (ref 3.5–5.0)
Alkaline Phosphatase: 69 U/L (ref 38–126)
Anion gap: 17 — ABNORMAL HIGH (ref 5–15)
BUN: 8 mg/dL (ref 6–20)
CO2: 21 mmol/L — ABNORMAL LOW (ref 22–32)
Calcium: 9.3 mg/dL (ref 8.9–10.3)
Chloride: 101 mmol/L (ref 98–111)
Creatinine, Ser: 0.73 mg/dL (ref 0.61–1.24)
GFR, Estimated: >60 mL/min (ref >60)
Glucose, Bld: 83 mg/dL (ref 70–99)
Potassium: 3.6 mmol/L (ref 3.5–5.1)
Sodium: 139 mmol/L (ref 135–145)
Total Bilirubin: 0.8 mg/dL (ref 0.0–1.2)
Total Protein: 8.7 g/dL — ABNORMAL HIGH (ref 6.5–8.1)

## 2023-09-12 NOTE — ED Triage Notes (Addendum)
 Pt comes in right knee pain. Pt does have BKA on the left leg. Pt states he was going nothing when the pain started. Pt states the pain is where the pins in his knee area was placed. Pt denies having any issues with it until now. A&Ox4. Pt states he has numbness and tingling in right knee area. He can not bear any weight on it.   It feels like the pins in my knee popped out

## 2023-09-13 MED ORDER — OXYCODONE-ACETAMINOPHEN 5-325 MG PO TABS
2.0000 | ORAL_TABLET | Freq: Once | ORAL | Status: AC
  Administered 2023-09-13: 2 via ORAL
  Filled 2023-09-13: qty 2

## 2023-09-13 MED ORDER — OXYCODONE-ACETAMINOPHEN 5-325 MG PO TABS: 1.0000 | ORAL_TABLET | Freq: Four times a day (QID) | ORAL | 0 refills | Status: AC | PRN
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
## 2023-09-13 NOTE — ED Notes (Signed)
 Pt asked to see RN or EDP. This RN spoke with pt. Pt requesting pain medications at this time. Explained to pt that EDP has to assess pt first. This RN offered alternatives to aid in pain control. Pt agreed to heat pack. Pt given heat pack to apply to right knee.

## 2023-09-13 NOTE — Discharge Instructions (Addendum)
 Begin taking ibuprofen  600 mg every 6 hours as needed for pain.  Begin taking Percocet as prescribed as needed for pain not relieved with ibuprofen .  Follow-up with orthopedic surgery in the next week if not improving.  The contact information for Dr. Harden has been provided in this discharge summary for you to call and make these arrangements.  He appears to be the surgeon who performed the surgery on your right leg.

## 2023-09-13 NOTE — ED Notes (Signed)
 Pt taken to the lobby per pt request. Pt unable to reach contact for a ride. Pt refused to use transportation services when offered.

## 2023-12-07 ENCOUNTER — Encounter: Payer: Self-pay | Admitting: Radiology
